# Patient Record
Sex: Female | Born: 1960 | Race: Black or African American | Hispanic: No | Smoking: Former smoker
Health system: Southern US, Community
[De-identification: ages and names within clinical notes are randomized; demographics above are authoritative.]

## PROBLEM LIST (undated history)

## (undated) DIAGNOSIS — E7800 Pure hypercholesterolemia, unspecified: Secondary | ICD-10-CM

## (undated) DIAGNOSIS — B009 Herpesviral infection, unspecified: Secondary | ICD-10-CM

## (undated) DIAGNOSIS — N3281 Overactive bladder: Secondary | ICD-10-CM

## (undated) DIAGNOSIS — G5601 Carpal tunnel syndrome, right upper limb: Secondary | ICD-10-CM

## (undated) DIAGNOSIS — M545 Low back pain, unspecified: Secondary | ICD-10-CM

## (undated) DIAGNOSIS — R87619 Unspecified abnormal cytological findings in specimens from cervix uteri: Secondary | ICD-10-CM

## (undated) DIAGNOSIS — Z9189 Other specified personal risk factors, not elsewhere classified: Secondary | ICD-10-CM

## (undated) DIAGNOSIS — Z8489 Family history of other specified conditions: Secondary | ICD-10-CM

## (undated) DIAGNOSIS — I10 Essential (primary) hypertension: Secondary | ICD-10-CM

## (undated) DIAGNOSIS — L659 Nonscarring hair loss, unspecified: Secondary | ICD-10-CM

## (undated) DIAGNOSIS — J189 Pneumonia, unspecified organism: Secondary | ICD-10-CM

## (undated) DIAGNOSIS — R569 Unspecified convulsions: Secondary | ICD-10-CM

## (undated) HISTORY — PX: CRYOTHERAPY: SHX1416

## (undated) HISTORY — DX: Low back pain, unspecified: M54.50

## (undated) HISTORY — DX: Nonscarring hair loss, unspecified: L65.9

## (undated) HISTORY — PX: TRIGGER FINGER RELEASE: SHX641

## (undated) HISTORY — PX: ECTOPIC PREGNANCY SURGERY: SHX613

## (undated) HISTORY — DX: Carpal tunnel syndrome, right upper limb: G56.01

## (undated) HISTORY — DX: Unspecified convulsions: R56.9

## (undated) HISTORY — DX: Low back pain: M54.5

## (undated) HISTORY — DX: Other specified personal risk factors, not elsewhere classified: Z91.89

## (undated) HISTORY — DX: Overactive bladder: N32.81

## (undated) HISTORY — DX: Unspecified abnormal cytological findings in specimens from cervix uteri: R87.619

## (undated) HISTORY — DX: Herpesviral infection, unspecified: B00.9

## (undated) HISTORY — DX: Pure hypercholesterolemia, unspecified: E78.00

---

## 1999-09-11 ENCOUNTER — Ambulatory Visit (HOSPITAL_COMMUNITY): Admission: RE | Admit: 1999-09-11 | Discharge: 1999-09-11 | Payer: Self-pay | Admitting: Psychiatry

## 2002-01-28 ENCOUNTER — Encounter: Payer: Self-pay | Admitting: Obstetrics and Gynecology

## 2002-01-28 ENCOUNTER — Ambulatory Visit (HOSPITAL_COMMUNITY): Admission: RE | Admit: 2002-01-28 | Discharge: 2002-01-28 | Payer: Self-pay | Admitting: Obstetrics and Gynecology

## 2003-02-03 ENCOUNTER — Ambulatory Visit (HOSPITAL_COMMUNITY): Admission: RE | Admit: 2003-02-03 | Discharge: 2003-02-03 | Payer: Self-pay | Admitting: Obstetrics and Gynecology

## 2003-02-03 ENCOUNTER — Encounter: Payer: Self-pay | Admitting: Obstetrics and Gynecology

## 2004-03-08 ENCOUNTER — Ambulatory Visit (HOSPITAL_COMMUNITY): Admission: RE | Admit: 2004-03-08 | Discharge: 2004-03-08 | Payer: Self-pay | Admitting: Obstetrics and Gynecology

## 2004-03-15 ENCOUNTER — Ambulatory Visit (HOSPITAL_COMMUNITY): Admission: RE | Admit: 2004-03-15 | Discharge: 2004-03-15 | Payer: Self-pay | Admitting: Obstetrics and Gynecology

## 2004-06-22 ENCOUNTER — Inpatient Hospital Stay (HOSPITAL_COMMUNITY): Admission: RE | Admit: 2004-06-22 | Discharge: 2004-06-25 | Payer: Self-pay | Admitting: Obstetrics and Gynecology

## 2004-06-23 HISTORY — PX: ABDOMINAL HYSTERECTOMY: SHX81

## 2004-08-13 ENCOUNTER — Emergency Department (HOSPITAL_COMMUNITY): Admission: EM | Admit: 2004-08-13 | Discharge: 2004-08-13 | Payer: Self-pay | Admitting: Emergency Medicine

## 2005-03-21 ENCOUNTER — Ambulatory Visit (HOSPITAL_COMMUNITY): Admission: RE | Admit: 2005-03-21 | Discharge: 2005-03-21 | Payer: Self-pay | Admitting: Obstetrics and Gynecology

## 2005-04-04 ENCOUNTER — Encounter: Admission: RE | Admit: 2005-04-04 | Discharge: 2005-04-04 | Payer: Self-pay | Admitting: Obstetrics and Gynecology

## 2006-07-10 ENCOUNTER — Ambulatory Visit (HOSPITAL_COMMUNITY): Admission: RE | Admit: 2006-07-10 | Discharge: 2006-07-10 | Payer: Self-pay | Admitting: Obstetrics and Gynecology

## 2007-01-15 ENCOUNTER — Ambulatory Visit (HOSPITAL_COMMUNITY): Admission: RE | Admit: 2007-01-15 | Discharge: 2007-01-15 | Payer: Self-pay | Admitting: Obstetrics and Gynecology

## 2007-07-09 ENCOUNTER — Emergency Department (HOSPITAL_COMMUNITY): Admission: EM | Admit: 2007-07-09 | Discharge: 2007-07-10 | Payer: Self-pay | Admitting: Emergency Medicine

## 2007-07-24 ENCOUNTER — Encounter: Admission: RE | Admit: 2007-07-24 | Discharge: 2007-07-24 | Payer: Self-pay | Admitting: Obstetrics and Gynecology

## 2007-08-06 ENCOUNTER — Ambulatory Visit (HOSPITAL_COMMUNITY): Admission: RE | Admit: 2007-08-06 | Discharge: 2007-08-06 | Payer: Self-pay | Admitting: Surgery

## 2007-10-03 ENCOUNTER — Other Ambulatory Visit: Admission: RE | Admit: 2007-10-03 | Discharge: 2007-10-03 | Payer: Self-pay | Admitting: Obstetrics and Gynecology

## 2008-02-20 ENCOUNTER — Encounter: Admission: RE | Admit: 2008-02-20 | Discharge: 2008-02-20 | Payer: Self-pay | Admitting: Obstetrics and Gynecology

## 2009-02-11 ENCOUNTER — Ambulatory Visit (HOSPITAL_COMMUNITY): Admission: RE | Admit: 2009-02-11 | Discharge: 2009-02-11 | Payer: Self-pay | Admitting: Obstetrics and Gynecology

## 2009-06-02 ENCOUNTER — Other Ambulatory Visit: Admission: RE | Admit: 2009-06-02 | Discharge: 2009-06-02 | Payer: Self-pay | Admitting: Obstetrics and Gynecology

## 2010-02-24 ENCOUNTER — Ambulatory Visit (HOSPITAL_COMMUNITY): Admission: RE | Admit: 2010-02-24 | Discharge: 2010-02-24 | Payer: Self-pay | Admitting: Obstetrics and Gynecology

## 2010-06-21 ENCOUNTER — Other Ambulatory Visit: Admission: RE | Admit: 2010-06-21 | Discharge: 2010-06-21 | Payer: Self-pay | Admitting: Obstetrics and Gynecology

## 2010-10-30 ENCOUNTER — Ambulatory Visit: Payer: Self-pay | Admitting: Women's Health

## 2011-01-21 ENCOUNTER — Encounter: Payer: Self-pay | Admitting: Obstetrics and Gynecology

## 2011-02-01 ENCOUNTER — Other Ambulatory Visit: Payer: Self-pay | Admitting: Obstetrics and Gynecology

## 2011-02-01 DIAGNOSIS — Z1231 Encounter for screening mammogram for malignant neoplasm of breast: Secondary | ICD-10-CM

## 2011-02-01 DIAGNOSIS — Z139 Encounter for screening, unspecified: Secondary | ICD-10-CM

## 2011-02-16 ENCOUNTER — Ambulatory Visit (HOSPITAL_COMMUNITY): Payer: Self-pay

## 2011-02-26 ENCOUNTER — Ambulatory Visit (HOSPITAL_COMMUNITY): Payer: Self-pay

## 2011-03-02 ENCOUNTER — Ambulatory Visit (HOSPITAL_COMMUNITY): Payer: 59

## 2011-03-12 ENCOUNTER — Ambulatory Visit (HOSPITAL_COMMUNITY): Payer: 59

## 2011-04-02 ENCOUNTER — Ambulatory Visit: Payer: 59

## 2011-04-16 ENCOUNTER — Ambulatory Visit
Admission: RE | Admit: 2011-04-16 | Discharge: 2011-04-16 | Disposition: A | Discharge status: Home / Self Care | Payer: 59 | Source: Ambulatory Visit | Attending: Obstetrics and Gynecology | Admitting: Obstetrics and Gynecology

## 2011-04-16 DIAGNOSIS — Z1231 Encounter for screening mammogram for malignant neoplasm of breast: Secondary | ICD-10-CM

## 2011-05-18 NOTE — Op Note (Signed)
NAMEEVANNA, Lisa [name]                       ACCOUNT NO.:  [id number]   MEDICAL RECORD NO.:  [id number]                   PATIENT TYPE:  INP   LOCATION:  A418                                 FACILITY:  [facility]   PHYSICIAN:  Tilda Burrow, M.D.              DATE OF BIRTH:  03-20-1961   DATE OF PROCEDURE:  DATE OF DISCHARGE:                                 OPERATIVE REPORT   ADMITTING DIAGNOSES:  1. Symptomatic uterine fibroids 10-12 weeks.  2. Abdominal wall laxity with redundant skin.   DISCHARGE DIAGNOSES:  1. Symptomatic uterine fibroids 10-12 weeks.  2. Abdominal wall laxity with redundant skin.   PROCEDURE:  1. Abdominal supracervical hysterectomy.  2. Partial panniculectomy.   SURGEON:  Tilda Burrow, M.D.   ASSISTANTAsencion Noble, CST-FA.   ANESTHESIA:  General   COMPLICATIONS:  None.   FINDINGS:  Large fibroids and mobile uterus, lax lower abdominal tissues.   DETAILS OF PROCEDURE:  The patient was taken to the operating room after  preoperative marking of the skin from anterior-superior iliac crest side-to-  side, marking off an ellipse of skin approximately 40 cm in length by 10 cm  in width.  The Foley catheter was in place, the vagina did not require  prepping and we proceeded with hysterectomy.  The wide ellipse of skin was  cut along with its outer edges with Bovie transection and then a flap, 2 cm  thick by 10 cm wide, of skin and underlying fatty tissue was removed.  We  were careful to leave a layer of intact fatty tissue approximately 1-2 cm  thick over the fascia itself.  This specimen was taken off with point  cautery use, as necessary, to achieve hemostasis.  The old C-section scars  were taken out as a part of this.  We then proceeded with a Pfannenstiel-  type fascial incision going transversely and dissecting the fascia off of  the underlying musculature.  This was somewhat challenging due to the  evidence of prior Pfannenstiel incisions x2.   The peritoneal cavity was entered in the midline without any suspicion of  injury to bowel which was inspected.  We then packed the bowel away and used  Balfour retractors to hold sidewalls out of the place.  The uterus could be  rotated into the incision.  We then proceeded with hysterectomy using the  Gyrus bipolar coagulator, cautery device to cross-clamp the round ligament  followed by cross-clamping the uteroovarian vessels.  This was quite  successful.   Upon reaching the level of the uterine vessels on the right we were  concerned and used standard curved Heaney clamp, cross-clamping, and used  the Gyrus for backbleeding and were able to cross-tie the uterine vessels  using #0 chromic suture.  We marched down the side of the broad ligament  reaching the upper cardinal ligaments and stopped.  The bladder flap had  been developed anteriorly  and peeled off without difficulty.   We then used knife dissection to amputate across the lower uterine segment  of the uterus leaving a small 1-cm thick portion of the cervix.  Hemostasis  was not complete due to oozing from the back of the cuff, but this was  treated with a series of figure-of-eight sutures which performed, in  essence, a reverse conization inversion of the cervix.  The pelvis was  irrigated and hemostasis confirmed.   The bladder flap was lightly tacked down over the vaginal cuff using  interrupted 2-0 plain.  We then proceeded with irrigation, removal of  laparotomy equipment, and closure of the anterior peritoneum with 2-0  chromic. At this point we inspected the fascia and removed a 1-cm-to-2-cm  wide x 15 cm long edge of the incision to reduce fascial laxity.  The  remaining fascial tissues were then pulled together with the previously  described #0 Dexon sutures.  Hemostasis was excellent.   Two flat JP drains were placed in the subcu fatty space which was inspected  for hemostasis; point cautery used as necessary. Then  the subcu tissues  reapproximated using a series of 2-0 plain sutures with good tissue edge  reapproximation.  The JP drains were allowed to exit through separate stab  incisions at the sides.   We then proceeded with closure of the skin flaps using staples with 2 drains  placed to suction.  The patient tolerated the procedure well with 200 cc  blood loss.      ___________________________________________                                            Tilda Burrow, M.D.   JVF/MEDQ  D:  06/23/2004  T:  06/23/2004  Job:  [id number]

## 2011-05-18 NOTE — H&P (Signed)
Lisa [name], RAYBORN NO.:  [id number]   MEDICAL RECORD NO.:  [id number]                  PATIENT TYPE:   LOCATION:                                       FACILITY:   PHYSICIAN:  Tilda Burrow, M.D.              DATE OF BIRTH:   DATE OF ADMISSION:  DATE OF DISCHARGE:                                HISTORY & PHYSICAL   ADMISSION DIAGNOSIS:  Symptomatic uterine fibroids, 10 to 12-week size.   HISTORY OF PRESENT ILLNESS:  This 50 year old female, gravida 4, para 3,  with past cesarean section x 1, was admitted at this time for supracervical  hysterectomy.  Panniculectomy is planned as well.  The patient has been seen  through our office, has had symptomatic complaints of uterine fibroids.  She  has been followed in our office for complaints of pressure.  Ultrasound  estimate of uterine weight is 335 g, consistent with 12-week size with  uterine fibroid up to 6 cm in diameter confirmed on ultrasound in March of  this year.  The patient has complaints of pelvic pressure, bladder  restriction, and desire to proceed with hysterectomy.  We have had a lengthy  discussion about the details regarding the hysterectomy.  First of all, the  patient has had a prior cesarean section with scar and has some lower  abdominal wall laxity related to childbearing.  Plans are to proceed with  partial panniculectomy, removing an Elipse of skin and underlying fatty  tissue to reduce the lower abdominal protuberance and the crease that is  associated with the old scar.   Secondly, pros and cons of preserving the cervix have been discussed.  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  There have  been no atypical cells noted of any sort during that decade.  Subsequently,  therefore, the patient  desires preservation of the cervix for pelvic support  and normal cervical lubrication production.   Thirdly, ovarian preservation is planned.   PAST MEDICAL HISTORY:  Positive for seizures.   PAST SURGICAL HISTORY:  1. Laparoscopy involved ectopic pregnancy,and so left salpingectomy was     performed.  2. Cesarean section in 1993.  3. Cryocautery of the cervix in her 4s.   FAMILY HISTORY:  Positive for breast cancer.   SOCIAL HISTORY:  Married, stable relationship.  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   PHYSICAL EXAMINATION:  GENERAL:  Healthy-appearing African-American female,  alert and oriented x 3.  HEENT:  Pupils equal, round, and reactive to light. Extraocular movements  intact.  NECK: Supple.  Trachea midline.  CHEST:  Clear to auscultation.  BREASTS:  Exam normal.  VITAL SIGNS:  Weight 157.  Blood pressure 122/70.  ABDOMEN:  Nontender without masses.  PELVIC:  External genitalia normal.  Multiparous female.  Vagina exam:  Normal secretions. Cervix bulbous, multiparous uterus, anteflexed.  Adnexa  negative for masses other than the 12-week size uterus.   LABORATORY AND X-RAY DATA:  Urinalysis negative.   PLAN:  Supracervical hysterectomy with preservation of ovaries, also with  panniculectomy on June 23, 2004.     ___________________________________________                                         Tilda Burrow, M.D.   JVF/MEDQ  D:  06/21/2004  T:  06/21/2004  Job:  [id number]

## 2011-05-18 NOTE — Discharge Summary (Signed)
[name], [name]                       ACCOUNT NO.:  [id number]   MEDICAL RECORD NO.:  [id number]                   PATIENT TYPE:  INP   LOCATION:  A418                                 FACILITY:  [facility]   PHYSICIAN:  Tilda Burrow, M.D.              DATE OF BIRTH:  30-Oct-1961   DATE OF ADMISSION:  06/22/2004  DATE OF DISCHARGE:  06/25/2004                                 DISCHARGE SUMMARY   ADMITTING DIAGNOSES:  1. Symptomatic uterine fibroids 10-12 weeks size.  2. 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   DISCHARGE DIAGNOSES:  1. Symptomatic uterine fibroids 10-12 weeks size.  2. 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   PROCEDURES:  1. Supracervical hysterectomy.  2. Panniculectomy.   HOSPITAL COURSE:  This healthy 50 year old female with admission for  hysterectomy for uterine fibroids and symptomatic pelvic pressure is  admitted for management after outpatient hysterectomy and panniculectomy.  Findings included very mobile uterus structures, relaxed ovaries and  abdominal wall laxity that could  be improved.  The incision on the  patient's abdomen was approximately 40 cm in length, reaching from hip bone  to hip bone.   Hospital course was straight forward.  The patient had a subcutaneous JP  drain, had hemoglobin 9, hematocrit 34 with excellent postoperative course.  She was considered stable for discharge, as per patient preference, on the  evening of June 25, 2004.  Followup in one week for staple removal and  Jackson-Pratt drain assessment in our office.     ___________________________________________                                         Tilda Burrow, M.D.   JVF/MEDQ  D:  06/29/2004  T:  06/29/2004  Job:  [id number]

## 2011-06-28 ENCOUNTER — Other Ambulatory Visit: Payer: Self-pay | Admitting: Adult Health

## 2011-06-28 ENCOUNTER — Other Ambulatory Visit (HOSPITAL_COMMUNITY)
Admission: RE | Admit: 2011-06-28 | Discharge: 2011-06-28 | Disposition: A | Discharge status: Home / Self Care | Payer: 59 | Source: Ambulatory Visit | Attending: Obstetrics and Gynecology | Admitting: Obstetrics and Gynecology

## 2011-06-28 DIAGNOSIS — Z01419 Encounter for gynecological examination (general) (routine) without abnormal findings: Secondary | ICD-10-CM | POA: Insufficient documentation

## 2011-11-08 LAB — LIPID PANEL
HDL: 56 mg/dL (ref 35–70)
LDL Cholesterol: 147 mg/dL
LDl/HDL Ratio: 4.1
Triglycerides: 119 mg/dL (ref 40–160)

## 2011-11-08 LAB — BASIC METABOLIC PANEL: Glucose: 95 mg/dL

## 2011-11-28 ENCOUNTER — Other Ambulatory Visit: Payer: Self-pay

## 2011-11-28 MED ORDER — FLUCONAZOLE 150 MG PO TABS: 150.0000 mg | ORAL_TABLET | Freq: Once | ORAL | Status: AC

## 2011-11-28 NOTE — Telephone Encounter (Signed)
Office visit if no relief 

## 2011-12-13 ENCOUNTER — Other Ambulatory Visit: Payer: Self-pay | Admitting: *Deleted

## 2012-01-21 ENCOUNTER — Ambulatory Visit (INDEPENDENT_AMBULATORY_CARE_PROVIDER_SITE_OTHER): Payer: BC Managed Care – PPO | Admitting: Family Medicine

## 2012-01-21 ENCOUNTER — Encounter: Payer: Self-pay | Admitting: Family Medicine

## 2012-01-21 VITALS — BP 112/82 | HR 77 | Resp 16 | Ht 62.0 in | Wt 183.0 lb

## 2012-01-21 DIAGNOSIS — G40909 Epilepsy, unspecified, not intractable, without status epilepticus: Secondary | ICD-10-CM | POA: Insufficient documentation

## 2012-01-21 DIAGNOSIS — N393 Stress incontinence (female) (male): Secondary | ICD-10-CM

## 2012-01-21 DIAGNOSIS — R822 Biliuria: Secondary | ICD-10-CM

## 2012-01-21 DIAGNOSIS — R569 Unspecified convulsions: Secondary | ICD-10-CM

## 2012-01-21 DIAGNOSIS — R300 Dysuria: Secondary | ICD-10-CM

## 2012-01-21 DIAGNOSIS — N390 Urinary tract infection, site not specified: Secondary | ICD-10-CM

## 2012-01-21 NOTE — Assessment & Plan Note (Signed)
Referral to urology

## 2012-01-21 NOTE — Patient Instructions (Signed)
Have your labs sent to our office I will set you up with [facility] for the stress incontinence Start a once a day vitamin  Have your neurologist send [place] your next office visit We will call with results of your urine. Follow-up after you see urology

## 2012-01-21 NOTE — Assessment & Plan Note (Signed)
Unknown cause, no hepatic injury or history. Obtain CMET

## 2012-01-21 NOTE — Assessment & Plan Note (Signed)
Followed by neurology.   

## 2012-01-21 NOTE — Assessment & Plan Note (Signed)
Persistent symptoms , culture urine before further treatment

## 2012-01-21 NOTE — Progress Notes (Signed)
  Subjective:    Patient ID: Lisa [name], female    DOB: 06/21/1961, 50 y.o.   MRN: [id number]  HPI Pt here to establish care  OB/GYN- Dr. Emelda Fear, [facility]   Overactive bladder- stress incontinence with sneezing, laughing, 2 vaginal delivers, hysterectomy, does not wear pads, has tried Kegal exercises but this has not helped  Seizure- first occurred  10 years ago during sleep.Dr. Caroline More- [facility], obtains depakote levels once a year , maintained on 200mg  daily   Dyrusia - itching and burning sensation, 2 weeks ago was on antibiotic for urine, continues to have symptoms, denies vaginal discharge  Tooth Infection- was on antibiotics from Oct to Dec- Penicillin and cephalasporin, took Diflucan for Yeast infection a few weeks before UTI diagnosed,s/p bridge. Pt continues to have pain, has f/u with dentist   Works at [employer]- had FLP and a few other labs obtained for wellness exam   Mammogram UTD- First Depree relatives with breast cancer prior to age 51 Review of Systems   GEN- denies fatigue, fever, weight loss,weakness, recent illness HEENT- denies eye drainage, change in vision, nasal discharge, CVS- denies chest pain, palpitations RESP- denies SOB, cough, wheeze ABD- denies N/V, change in stools, abd pain GU- denies dysuria, hematuria, dribbling,+ incontinence MSK- denies joint pain, muscle aches, injury Neuro- denies headache, dizziness, syncope, recent seizure activity      Objective:   Physical Exam GEN- NAD, alert and oriented x3 HEENT- PERRL, EOMI, non injected sclera, pink conjunctiva, MMM, oropharynx clear, bridge noted, no tooth abscess,  Neck- Supple, no thyromegaly, no carotid bruit CVS- RRR, no murmur RESP-CTAB ABD- NABS, soft, NT, ND, no CVA tenderness EXT- No edema Pulses- Radial, DP- 2+        Assessment & Plan:

## 2012-01-23 ENCOUNTER — Telehealth: Payer: Self-pay | Admitting: Family Medicine

## 2012-01-25 ENCOUNTER — Telehealth: Payer: Self-pay | Admitting: Family Medicine

## 2012-01-25 ENCOUNTER — Encounter: Payer: Self-pay | Admitting: Family Medicine

## 2012-01-25 LAB — URINE CULTURE

## 2012-01-25 MED ORDER — AMPICILLIN 500 MG PO CAPS: 500.0000 mg | ORAL_CAPSULE | Freq: Four times a day (QID) | ORAL | Status: AC

## 2012-01-25 NOTE — Telephone Encounter (Signed)
Please let pt know I received her lipid panel , her bad cholesterol- LDL is a little high and her total cholesterol is high, she should incorporate a low fat diet, and decrease fried and fatty foods. Her urine culture showed a more resistant bacteria, I have sent in ampicillin four times a day for 5 days to clear this up.

## 2012-01-28 ENCOUNTER — Telehealth: Payer: Self-pay

## 2012-01-28 MED ORDER — FLUCONAZOLE 150 MG PO TABS: 150.0000 mg | ORAL_TABLET | Freq: Once | ORAL | Status: DC

## 2012-01-28 NOTE — Telephone Encounter (Signed)
Yes, Lisa [name] made aware

## 2012-01-28 NOTE — Telephone Encounter (Signed)
Diflucan provided this patient on antibiotics for urine infection

## 2012-01-28 NOTE — Telephone Encounter (Signed)
Patient made aware by courtney

## 2012-01-31 LAB — CBC
HCT: 38.3 % (ref 36.0–46.0)
Hemoglobin: 13 g/dL (ref 12.0–15.0)
MCHC: 33.9 g/dL (ref 30.0–36.0)
Platelets: 278 10*3/uL (ref 150–400)
RDW: 13.5 % (ref 11.5–15.5)

## 2012-03-11 ENCOUNTER — Ambulatory Visit (INDEPENDENT_AMBULATORY_CARE_PROVIDER_SITE_OTHER): Payer: BC Managed Care – PPO | Admitting: Urology

## 2012-03-11 DIAGNOSIS — R350 Frequency of micturition: Secondary | ICD-10-CM

## 2012-03-11 DIAGNOSIS — R3913 Splitting of urinary stream: Secondary | ICD-10-CM

## 2012-03-11 DIAGNOSIS — R32 Unspecified urinary incontinence: Secondary | ICD-10-CM

## 2012-03-11 DIAGNOSIS — N393 Stress incontinence (female) (male): Secondary | ICD-10-CM

## 2012-03-25 ENCOUNTER — Other Ambulatory Visit: Payer: Self-pay | Admitting: Obstetrics and Gynecology

## 2012-03-25 DIAGNOSIS — Z139 Encounter for screening, unspecified: Secondary | ICD-10-CM

## 2012-04-08 DIAGNOSIS — Z87898 Personal history of other specified conditions: Secondary | ICD-10-CM | POA: Insufficient documentation

## 2012-04-28 ENCOUNTER — Ambulatory Visit (HOSPITAL_COMMUNITY)
Admission: RE | Admit: 2012-04-28 | Discharge: 2012-04-28 | Disposition: A | Discharge status: Home / Self Care | Payer: BC Managed Care – PPO | Source: Ambulatory Visit | Attending: Obstetrics and Gynecology | Admitting: Obstetrics and Gynecology

## 2012-04-28 DIAGNOSIS — Z1231 Encounter for screening mammogram for malignant neoplasm of breast: Secondary | ICD-10-CM | POA: Insufficient documentation

## 2012-04-28 DIAGNOSIS — Z139 Encounter for screening, unspecified: Secondary | ICD-10-CM

## 2012-04-29 ENCOUNTER — Ambulatory Visit (INDEPENDENT_AMBULATORY_CARE_PROVIDER_SITE_OTHER): Payer: BC Managed Care – PPO | Admitting: Urology

## 2012-04-29 DIAGNOSIS — N393 Stress incontinence (female) (male): Secondary | ICD-10-CM

## 2012-04-29 DIAGNOSIS — N3941 Urge incontinence: Secondary | ICD-10-CM

## 2012-04-29 DIAGNOSIS — R3915 Urgency of urination: Secondary | ICD-10-CM

## 2012-05-01 ENCOUNTER — Encounter: Payer: Self-pay | Admitting: Family Medicine

## 2012-05-01 DIAGNOSIS — Z803 Family history of malignant neoplasm of breast: Secondary | ICD-10-CM | POA: Insufficient documentation

## 2012-05-06 ENCOUNTER — Encounter (HOSPITAL_COMMUNITY): Payer: Self-pay | Admitting: *Deleted

## 2012-05-06 ENCOUNTER — Emergency Department (HOSPITAL_COMMUNITY): Payer: BC Managed Care – PPO

## 2012-05-06 ENCOUNTER — Emergency Department (HOSPITAL_COMMUNITY)
Admission: EM | Admit: 2012-05-06 | Discharge: 2012-05-06 | Disposition: A | Discharge status: Home / Self Care | Payer: BC Managed Care – PPO | Attending: Emergency Medicine | Admitting: Emergency Medicine

## 2012-05-06 DIAGNOSIS — M545 Low back pain, unspecified: Secondary | ICD-10-CM | POA: Insufficient documentation

## 2012-05-06 DIAGNOSIS — Z79899 Other long term (current) drug therapy: Secondary | ICD-10-CM | POA: Insufficient documentation

## 2012-05-06 DIAGNOSIS — M5430 Sciatica, unspecified side: Secondary | ICD-10-CM | POA: Insufficient documentation

## 2012-05-06 DIAGNOSIS — M533 Sacrococcygeal disorders, not elsewhere classified: Secondary | ICD-10-CM | POA: Insufficient documentation

## 2012-05-06 DIAGNOSIS — G40909 Epilepsy, unspecified, not intractable, without status epilepticus: Secondary | ICD-10-CM | POA: Insufficient documentation

## 2012-05-06 DIAGNOSIS — IMO0001 Reserved for inherently not codable concepts without codable children: Secondary | ICD-10-CM | POA: Insufficient documentation

## 2012-05-06 DIAGNOSIS — M5432 Sciatica, left side: Secondary | ICD-10-CM

## 2012-05-06 DIAGNOSIS — M79609 Pain in unspecified limb: Secondary | ICD-10-CM | POA: Insufficient documentation

## 2012-05-06 DIAGNOSIS — M25559 Pain in unspecified hip: Secondary | ICD-10-CM | POA: Insufficient documentation

## 2012-05-06 MED ORDER — HYDROCODONE-ACETAMINOPHEN 5-325 MG PO TABS: 1.0000 | ORAL_TABLET | ORAL | Status: AC | PRN

## 2012-05-06 MED ORDER — HYDROCODONE-ACETAMINOPHEN 5-325 MG PO TABS: 2.0000 | ORAL_TABLET | ORAL | Status: AC | PRN

## 2012-05-06 MED ORDER — PREDNISONE 20 MG PO TABS
60.0000 mg | ORAL_TABLET | Freq: Once | ORAL | Status: AC
  Administered 2012-05-06: 60 mg via ORAL
  Filled 2012-05-06: qty 3

## 2012-05-06 MED ORDER — PREDNISONE 20 MG PO TABS: ORAL_TABLET | ORAL | Status: DC

## 2012-05-06 NOTE — ED Notes (Signed)
Pt reports pain to left hip radiating down left leg starting last night, pt denies any injury

## 2012-05-06 NOTE — ED Notes (Signed)
Pt states awoke Sunday night with severe left hip/leg and lower back pain. Denies injury/trauma to back/hip. Pt denies bladder/bowel loss of function. Pt reports pain increases with movement.

## 2012-05-06 NOTE — Discharge Instructions (Signed)
Sciatica Sciatica is a weakness and/or changes in sensation (tingling, jolts, hot and cold, numbness) along the path the sciatic nerve travels. Irritation or damage to lumbar nerve roots is often also referred to as lumbar radiculopathy.  Lumbar radiculopathy (Sciatica) is the most common form of this problem. Radiculopathy can occur in any of the nerves coming out of the spinal cord. The problems caused depend on which nerves are involved. The sciatic nerve is the large nerve supplying the branches of nerves going from the hip to the toes. It often causes a numbness or weakness in the skin and/or muscles that the sciatic nerve serves. It also may cause symptoms (problems) of pain, burning, tingling, or electric shock-like feelings in the path of this nerve. This usually comes from injury to the fibers that make up the sciatic nerve. Some of these symptoms are low back pain and/or unpleasant feelings in the following areas:  From the mid-buttock down the back of the leg to the back of the knee.   And/or the outside of the calf and top of the foot.   And/or behind the inner ankle to the sole of the foot.  CAUSES   Herniated or slipped disc. Discs are the little cushions between the bones in the back.   Pressure by the piriformis muscle in the buttock on the sciatic nerve (Piriformis Syndrome).   Misalignment of the bones in the lower back and buttocks (Sacroiliac Joint Derangement).   Narrowing of the spinal canal that puts pressure on or pinches the fibers that make up the sciatic nerve.   A slipped vertebra that is out of line with those above or beneath it.   Abnormality of the nervous system itself so that nerve fibers do not transmit signals properly, especially to feet and calves (neuropathy).   Tumor (this is rare).  Your caregiver can usually determine the cause of your sciatica and begin the treatment most likely to help you. TREATMENT  Taking over-the-counter painkillers, physical  therapy, rest, exercise, spinal manipulation, and injections of anesthetics and/or steroids may be used. Surgery, acupuncture, and Yoga can also be effective. Mind over matter techniques, mental imagery, and changing factors such as your bed, chair, desk height, posture, and activities are other treatments that may be helpful. You and your caregiver can help determine what is best for you. With proper diagnosis, the cause of most sciatica can be identified and removed. Communication and cooperation between your caregiver and you is essential. If you are not successful immediately, do not be discouraged. With time, a proper treatment can be found that will make you comfortable. HOME CARE INSTRUCTIONS   If the pain is coming from a problem in the back, applying ice to that area for 15 to 20 minutes, 3 to 4 times per day while awake, may be helpful. Put the ice in a plastic bag. Place a towel between the bag of ice and your skin.   You may exercise or perform your usual activities if these do not aggravate your pain, or as suggested by your caregiver.   Only take over-the-counter or prescription medicines for pain, discomfort, or fever as directed by your caregiver.   If your caregiver has given you a follow-up appointment, it is very important to keep that appointment. Not keeping the appointment could result in a chronic or permanent injury, pain, and disability. If there is any problem keeping the appointment, you must call back to this facility for assistance.  SEEK IMMEDIATE MEDICAL CARE   IF:   You experience loss of control of bowel or bladder.   You have increasing weakness in the trunk, buttocks, or legs.   There is numbness in any areas from the hip down to the toes.   You have difficulty walking or keeping your balance.   You have any of the above, with fever or forceful vomiting.  Document Released: 12/11/2001 Document Revised: 12/06/2011 Document Reviewed: 07/30/2008 [facility] Patient  Information 2012 [place], [place].   Take your next dose of prednisone tomorrow evening.   Do not drive within 4 hours of taking hydrocodone  as this will make you drowsy.  Avoid lifting,  Bending,  Twisting or any other activity that worsens your pain over the next week.  Apply an  icepack  to your lower back for 10-15 minutes every 2 hours for the next 2 days.  You should get rechecked if your symptoms are not better over the next 5 days,  Or you develop increased pain,  Weakness in your leg(s) or loss of bladder or bowel function - these are symptoms of a worse injury.

## 2012-05-07 NOTE — ED Provider Notes (Signed)
Medical screening examination/treatment/procedure(s) were performed by non-physician practitioner and as supervising physician I was immediately available for consultation/collaboration.   Carleene Cooper III, MD 05/07/12 [phone]

## 2012-05-07 NOTE — ED Provider Notes (Signed)
History     CSN: [id number]  Arrival date & time 05/06/12  2025   First MD Initiated Contact with Patient 05/06/12 2127      Chief Complaint  Patient presents with  . Hip Pain    (Consider location/radiation/quality/duration/timing/severity/associated sxs/prior treatment) HPI Comments: Patient presents with a 24 hour history of left posterior buttock pain which radiates down her left posterior and lateral leg to her mid calf area.  She denies any new injury,  But does report a history of intermittent low back pain without known source.  Pain is constant with movement and palpation of her left lower back and buttock, and with weight bearing and better when sitting.  She denies weakness and numbness in her legs,  Denies fever, chills,  Urinary or bowel incontinence or retention.  She has not had fevers or chills,  No recent medical procedures and denies drug use.  She works in an Secondary school teacher role,  No work related injuries.  The history is provided by the patient.    Past Medical History  Diagnosis Date  . Seizures   . Carpal tunnel syndrome of right wrist   . Low back pain     Past Surgical History  Procedure Date  . Abdominal hysterectomy   . Ectopic pregnancy surgery   . Cesarean section     Family History  Problem Relation Age of Onset  . Cancer Mother     breast  . Diabetes Mother   . Hypertension Mother   . Cancer Father     lung   . Diabetes Father   . Hypertension Father   . Cancer Sister     breast     History  Substance Use Topics  . Smoking status: Never Smoker   . Smokeless tobacco: Not on file  . Alcohol Use: Yes     Rare    OB History    Grav Para Term Preterm Abortions TAB SAB Ect Mult Living                  Review of Systems  Constitutional: Negative for fever.  Respiratory: Negative for shortness of breath.   Cardiovascular: Negative for chest pain and leg swelling.  Gastrointestinal: Negative for abdominal pain, constipation and  abdominal distention.  Genitourinary: Negative for dysuria, urgency, frequency, flank pain and difficulty urinating.  Musculoskeletal: Positive for back pain. Negative for joint swelling and gait problem.  Skin: Negative for rash.  Neurological: Negative for weakness and numbness.    Allergies  Review of patient's allergies indicates no known allergies.  Home Medications   Current Outpatient Rx  Name Route Sig Dispense Refill  . IBUPROFEN 200 MG PO TABS Oral Take 800-1,000 mg by mouth 3 (three) times daily as needed. For pain    . PHENYTOIN SODIUM EXTENDED 100 MG PO CAPS Oral Take 200 mg by mouth every evening.     Marland Kitchen SOLIFENACIN SUCCINATE 10 MG PO TABS Oral Take 10 mg by mouth every morning.    Marland Kitchen HYDROCODONE-ACETAMINOPHEN 5-325 MG PO TABS Oral Take 1 tablet by mouth every 4 (four) hours as needed for pain. 15 tablet 0  . HYDROCODONE-ACETAMINOPHEN 5-325 MG PO TABS Oral Take 2 tablets by mouth every 4 (four) hours as needed for pain. 6 tablet 0  . PREDNISONE 20 MG PO TABS  6, 5, 4, 3, 2 then 1 tablet by mouth daily for 6 days total. 21 tablet 0    BP 129/74  Pulse 61  Temp(Src)  98.3 F (36.8 C) (Oral)  Resp 18  Ht 5\' 2"  (1.575 m)  Wt 185 lb (83.915 kg)  BMI 33.84 kg/m2  SpO2 100%  Physical Exam  Nursing note and vitals reviewed. Constitutional: She appears well-developed and well-nourished.  HENT:  Head: Normocephalic.  Eyes: Conjunctivae are normal.  Neck: Normal range of motion. Neck supple.  Cardiovascular: Normal rate and intact distal pulses.        Pedal pulses normal.  Pulmonary/Chest: Effort normal.  Abdominal: Soft. Bowel sounds are normal. She exhibits no distension and no mass.  Musculoskeletal: Normal range of motion. She exhibits tenderness. She exhibits no edema.       Lumbar back: She exhibits tenderness. She exhibits no swelling, no edema and no spasm.       Left SI joint ttp.  Neurological: She is alert. She has normal strength. She displays no atrophy and  no tremor. No sensory deficit. Gait normal.  Reflex Scores:      Patellar reflexes are 1+ on the right side and 1+ on the left side.      Achilles reflexes are 1+ on the right side and 1+ on the left side.      No strength deficit noted in hip and knee flexor and extensor muscle groups.  Ankle flexion and extension intact.  dtrs reduced but equal.  Skin: Skin is warm and dry.  Psychiatric: She has a normal mood and affect.    ED Course  Procedures (including critical care time)  Labs Reviewed - No data to display Dg Lumbar Spine Complete  05/06/2012  *RADIOLOGY REPORT*  Clinical Data: Lower back pain, radiating into the left hip.  LUMBAR SPINE - COMPLETE 4+ VIEW  Comparison: None.  Findings: There is no evidence of fracture or subluxation. Vertebral bodies demonstrate normal height and alignment. Intervertebral disc spaces are preserved.  The visualized neural foramina are grossly unremarkable in appearance.  The visualized bowel gas pattern is unremarkable in appearance; air and stool are noted within the colon.  The sacroiliac joints are within normal limits.  IMPRESSION: No evidence of fracture or subluxation along the lumbar spine.  Original Report Authenticated By: Tonia Ghent, M.D.     1. Sciatica of left side       MDM  Patients labs and/or radiological studies were reviewed during the medical decision making and disposition process. No neuro deficit on exam or by history to suggest emergent or surgical presentation.   Pt given prednisone 60 mg po here with prescription for 6 day taper,  Hydrocodone prepack given also with prescription to get filled in am.  Heat therapy,  Avoid lifting, bending,  Get rechecked by pcp if not improved over the next week.  Sx prompting emergent recheck discussed.       Burgess Amor, PA 05/07/12 1409

## 2012-05-13 MED FILL — Hydrocodone-Acetaminophen Tab 5-325 MG: ORAL | Qty: 6 | Status: AC

## 2012-05-19 ENCOUNTER — Ambulatory Visit: Payer: BC Managed Care – PPO | Admitting: Family Medicine

## 2012-05-22 ENCOUNTER — Ambulatory Visit (INDEPENDENT_AMBULATORY_CARE_PROVIDER_SITE_OTHER): Payer: BC Managed Care – PPO | Admitting: Family Medicine

## 2012-05-22 ENCOUNTER — Encounter: Payer: Self-pay | Admitting: Family Medicine

## 2012-05-22 ENCOUNTER — Ambulatory Visit: Payer: BC Managed Care – PPO | Admitting: Family Medicine

## 2012-05-22 VITALS — BP 118/80 | HR 66 | Resp 16 | Ht 62.0 in | Wt 183.4 lb

## 2012-05-22 DIAGNOSIS — E663 Overweight: Secondary | ICD-10-CM

## 2012-05-22 DIAGNOSIS — N393 Stress incontinence (female) (male): Secondary | ICD-10-CM

## 2012-05-22 DIAGNOSIS — Z79899 Other long term (current) drug therapy: Secondary | ICD-10-CM

## 2012-05-22 DIAGNOSIS — E785 Hyperlipidemia, unspecified: Secondary | ICD-10-CM

## 2012-05-22 DIAGNOSIS — R569 Unspecified convulsions: Secondary | ICD-10-CM

## 2012-05-22 DIAGNOSIS — M5430 Sciatica, unspecified side: Secondary | ICD-10-CM

## 2012-05-22 DIAGNOSIS — E669 Obesity, unspecified: Secondary | ICD-10-CM | POA: Insufficient documentation

## 2012-05-22 NOTE — Assessment & Plan Note (Signed)
Check FLP, discussed diet

## 2012-05-22 NOTE — Assessment & Plan Note (Signed)
Doing well, stable on meds, seen by neuro await record

## 2012-05-22 NOTE — Patient Instructions (Signed)
Get the labs done fasting Call if your back pain returns  F/U 1 year or as needed

## 2012-05-22 NOTE — Assessment & Plan Note (Signed)
Doing well since initial episode, responded well to dosepak

## 2012-05-22 NOTE — Assessment & Plan Note (Signed)
Reviewed urology note, doing well on vesicare, she typically takes 1/2 tablet

## 2012-05-22 NOTE — Progress Notes (Signed)
  Subjective:    Patient ID: Lisa [name], female    DOB: [birth year], 51 y.o.   MRN: [id number]  HPI Pt here to f/u ED visit, seen for back pain diagnosed with sciatica has had minimal tingling in back of left calf otherwise feels good. Given steroid dose pak and hydrocodone.  Incontinence- stress- seen by urology on vesicare with good results Due for labs Mammogram normal Seizure- no recent seizure activity, seen by her neurlogist, no change to meds   Review of Systems  GEN- denies fatigue, fever, weight loss,weakness, recent illness HEENT- denies eye drainage, change in vision, nasal discharge, CVS- denies chest pain, palpitations RESP- denies SOB, cough, wheeze ABD- denies N/V, change in stools, abd pain GU- denies dysuria, hematuria, dribbling, incontinence MSK- denies joint pain, muscle aches, injury Neuro- denies headache, dizziness, syncope, seizure activity       Objective:   Physical Exam GEN- NAD, alert and oriented x3 HEENT- PERRL, EOMI, non injected sclera, pink conjunctiva, MMM, oropharynx clear Neck- Supple,  CVS- RRR, no murmur RESP-CTAB BACK- spine non tender, neg SLR bilat Hip- normal ER/IR Neuro- DTR symmetric bilat, no focal deficits EXT- No edema Pulses- Radial, DP- 2+        Assessment & Plan:

## 2012-06-03 LAB — HEPATIC FUNCTION PANEL
AST: 42 U/L — AB (ref 13–35)
Bilirubin, Total: 0.3 mg/dL

## 2012-06-03 LAB — BASIC METABOLIC PANEL
BUN: 15 mg/dL (ref 4–21)
Glucose: 102 mg/dL
Potassium: 4.3 mmol/L (ref 3.4–5.3)
Sodium: 138 mmol/L (ref 137–147)

## 2012-06-03 LAB — LIPID PANEL
HDL: 66 mg/dL (ref 35–70)
Triglycerides: 116 mg/dL (ref 40–160)

## 2012-06-05 ENCOUNTER — Telehealth: Payer: Self-pay | Admitting: Family Medicine

## 2012-06-05 NOTE — Telephone Encounter (Signed)
Called to review labs, left message for pt to return call in AM

## 2012-06-06 ENCOUNTER — Telehealth: Payer: Self-pay | Admitting: Family Medicine

## 2012-06-06 DIAGNOSIS — R7989 Other specified abnormal findings of blood chemistry: Secondary | ICD-10-CM

## 2012-06-06 DIAGNOSIS — R822 Biliuria: Secondary | ICD-10-CM

## 2012-06-06 NOTE — Telephone Encounter (Signed)
I called and left a voice message, LFT (liver function) elevated some, history of bilirubin in urine and her cholesterol is getting worse (labs imput into system) Will need ultrasound of liver to look for any enlargement or signs of why LFT would be elevated.- These are not at dangerous levels  She needs to work on diet- regarding cholesterol avoid fried foods, fatty foods, bread, will not start statin at this time

## 2012-06-09 ENCOUNTER — Encounter: Payer: Self-pay | Admitting: Family Medicine

## 2012-06-09 NOTE — Telephone Encounter (Signed)
Letter mailed

## 2012-06-12 ENCOUNTER — Ambulatory Visit (HOSPITAL_COMMUNITY): Admission: RE | Admit: 2012-06-12 | Payer: BC Managed Care – PPO | Source: Ambulatory Visit

## 2012-06-17 ENCOUNTER — Telehealth: Payer: Self-pay | Admitting: Family Medicine

## 2012-06-17 NOTE — Telephone Encounter (Signed)
I spoke with [name], she has been out of town with her grandmother who has been sick. She needs to have ultrasound rescheduled for in 2 weeks as she will be out of town this Lisa [name]

## 2012-06-23 ENCOUNTER — Telehealth: Payer: Self-pay | Admitting: Family Medicine

## 2012-06-23 NOTE — Telephone Encounter (Signed)
Yes, she needs to followup 

## 2012-06-24 ENCOUNTER — Ambulatory Visit (INDEPENDENT_AMBULATORY_CARE_PROVIDER_SITE_OTHER): Payer: BC Managed Care – PPO | Admitting: Family Medicine

## 2012-06-24 ENCOUNTER — Encounter: Payer: Self-pay | Admitting: Family Medicine

## 2012-06-24 VITALS — BP 124/70 | HR 79 | Resp 16 | Ht 62.0 in | Wt 182.8 lb

## 2012-06-24 DIAGNOSIS — J189 Pneumonia, unspecified organism: Secondary | ICD-10-CM

## 2012-06-24 DIAGNOSIS — R7989 Other specified abnormal findings of blood chemistry: Secondary | ICD-10-CM

## 2012-06-24 NOTE — Patient Instructions (Addendum)
Complete the antibiotics  Get the labs drawn today  Repeat Chest x-ray get this done with your ultrasound- afternoon appt better Note for work  Keep previous f/u appt

## 2012-06-24 NOTE — Progress Notes (Signed)
  Subjective:    Patient ID: Lisa [name], female    DOB: [birth year], 51 y.o.   MRN: [id number]  HPI  Pt here to f/u pneumonia and elevated liver enzymes. She was evaluated by the [facility] in [place] after she began having right upper quadrant pain and feeling short of breath. She was diagnosed with pneumonia and given azithromycin. Labs were obtained which showed even greater elevated liver function testing. They did do a limited ultrasound of the gallbladder which was negative. White blood cell count was elevated to 16,500, AST 101 alkaline phosphatase 251 ALT 97 total bili 1.2. She continues to feel fatigued and has muscle aches her breathing has improved.  Review of Systems   GEN- + fatigue,+ fever, weight loss,weakness,+ recent illness HEENT- denies eye drainage, change in vision, nasal discharge, CVS- denies chest pain, palpitations RESP- denies SOB, +cough, wheeze ABD- denies N/V, change in stools, abd pain GU- denies dysuria, hematuria, dribbling, incontinence MSK- denies joint pain,+ muscle aches, injury Neuro- denies headache, dizziness, syncope, seizure activity      Objective:   Physical Exam GEN- NAD, alert and oriented x3, fatigued appearing HEENT- PERRL, EOMI, non injected sclera, pink conjunctiva, MMM, oropharynx clear Neck- Supple, no LAD CVS- RRR, no murmur RESP- rhonchi in right LL otherwise clear, normal WOB  ABD-NABS, NT,ND, no HSM palpated EXT- No edema Pulses- Radial, DP- 2+        Assessment & Plan:

## 2012-06-25 DIAGNOSIS — J189 Pneumonia, unspecified organism: Secondary | ICD-10-CM | POA: Insufficient documentation

## 2012-06-25 LAB — COMPREHENSIVE METABOLIC PANEL
AST: 35 U/L (ref 0–37)
Albumin: 3.7 g/dL (ref 3.5–5.2)
Alkaline Phosphatase: 296 U/L — ABNORMAL HIGH (ref 39–117)
Chloride: 102 mEq/L (ref 96–112)
Potassium: 4.5 mEq/L (ref 3.5–5.3)
Sodium: 140 mEq/L (ref 135–145)
Total Protein: 6.2 g/dL (ref 6.0–8.3)

## 2012-06-25 NOTE — Assessment & Plan Note (Signed)
Pt tells me today that her husband has Hepatitis C contracted when he was in [place], she has been tested before and was negative in the past , LFT were even higher at [facility] Will send to GI for elevation of abnormal labs Complete Ultrasound pending  Labs for Hep C,B and CMET

## 2012-06-25 NOTE — Assessment & Plan Note (Addendum)
She will complete antibiotics, diagnosed June 24th  Repeat Chest x-ray in 4 weeks Given letter for work

## 2012-06-26 ENCOUNTER — Telehealth: Payer: Self-pay | Admitting: Urgent Care

## 2012-06-26 NOTE — Telephone Encounter (Signed)
We will call Dr Maretta Bees office and have them send [place] referral

## 2012-06-26 NOTE — Telephone Encounter (Signed)
Referral in my work queue. I called patient to set up OV and she said her PCP was suppose to send referral to [facility] office.

## 2012-07-01 ENCOUNTER — Telehealth: Payer: Self-pay | Admitting: Family Medicine

## 2012-07-01 MED ORDER — MOXIFLOXACIN HCL 400 MG PO TABS: 400.0000 mg | ORAL_TABLET | Freq: Every day | ORAL | Status: AC

## 2012-07-01 NOTE — Telephone Encounter (Signed)
Will change to avelox for pneumonia pt not improving, obtain repeat CXR now

## 2012-07-01 NOTE — Telephone Encounter (Signed)
Please have her go ahead and get the chest x-ray ,  I have sent over a stronger antibiotic

## 2012-07-01 NOTE — Telephone Encounter (Signed)
Noted. Called and left voicemail for pt.

## 2012-07-07 ENCOUNTER — Telehealth: Payer: Self-pay | Admitting: Family Medicine

## 2012-07-09 NOTE — Telephone Encounter (Signed)
I sent a second antibiotic on 7/2 called Avelox did she take this?? She can try the chloroseptic, or warm tea with honey, if she cant come in tell her to go to an after hours urgent care close to work to be checked.

## 2012-07-09 NOTE — Telephone Encounter (Signed)
States that she is still having a very sore throat- lastnight it was so bad she had to hold her throat when she swallowed. She is still having thick mucus in her nose and throat that is green in color. Does not want to come back in if she can help it. Has been using OTC products and chloreseptic spray. Any other recommendations?

## 2012-07-10 NOTE — Telephone Encounter (Signed)
Patient aware and will pick up new antibiotic

## 2012-07-16 ENCOUNTER — Encounter (INDEPENDENT_AMBULATORY_CARE_PROVIDER_SITE_OTHER): Payer: Self-pay | Admitting: Internal Medicine

## 2012-07-16 ENCOUNTER — Ambulatory Visit (INDEPENDENT_AMBULATORY_CARE_PROVIDER_SITE_OTHER): Payer: BC Managed Care – PPO | Admitting: Internal Medicine

## 2012-07-16 VITALS — BP 92/60 | HR 64 | Temp 98.5°F | Ht 62.0 in | Wt 182.6 lb

## 2012-07-16 DIAGNOSIS — R748 Abnormal levels of other serum enzymes: Secondary | ICD-10-CM

## 2012-07-16 NOTE — Progress Notes (Signed)
Subjective:     Patient ID: Lisa [name], female   DOB: 1961/01/12, 51 y.o.   MRN: [id number]  Lisa [name] is a 51 yr old female referred to our office by Dr. Jeanice Lim for elevated liver enzymes. She also underwent an [place] which was normal. Hepatitis B and C were negative. No tattoos. Husband has Hepatitis C. Appetite is good.  No weight loss. No abdominal pain.  She was treated in June for pneumonia with Zithromax and Avelox CBC    Component Value Date/Time   WBC 5.0 01/30/2012 1730   RBC 4.47 01/30/2012 1730   HGB 13.0 01/30/2012 1730   HCT 38.3 01/30/2012 1730   PLT 278 01/30/2012 1730   MCV 85.7 01/30/2012 1730   MCH 29.1 01/30/2012 1730   MCHC 33.9 01/30/2012 1730   RDW 13.5 01/30/2012 1730     CMP     Component Value Date/Time   NA 140 06/24/2012 1650   NA 138 06/03/2012   K 4.5 06/24/2012 1650   CL 102 06/24/2012 1650   CO2 25 06/24/2012 1650   GLUCOSE 102* 06/24/2012 1650   BUN 8 06/24/2012 1650   BUN 15 06/03/2012   CREATININE 0.72 06/24/2012 1650   CREATININE 0.9 06/03/2012   CALCIUM 8.6 06/24/2012 1650   PROT 6.2 06/24/2012 1650   ALBUMIN 3.7 06/24/2012 1650   AST 35 06/24/2012 1650   ALT 64* 06/24/2012 1650   ALKPHOS 296* 06/24/2012 1650   BILITOT 0.3 06/24/2012 1650     Review of Systems     Objective:   Physical Exam  Filed Vitals:   07/16/12 1615  Height: 5\' 2"  (1.575 m)  Weight: 182 lb 9.6 oz (82.827 kg)    lert and oriented. Skin warm and dry. Oral mucosa is moist.   . Sclera anicteric, conjunctivae is pink. Thyroid not enlarged. No cervical lymphadenopathy. Lungs clear. Heart regular rate and rhythm.  Abdomen is soft. Bowel sounds are positive. No hepatomegaly. No abdominal masses felt. No tenderness.  No edema to lower extremities. Patient is alert and oriented. Alert and oriented. Skin warm and dry. Oral mucosa is moist.   . Sclera anicteric, conjunctivae is pink. Thyroid not enlarged. No cervical lymphadenopathy. Lungs clear. Heart regular rate and rhythm.  Abdomen is  soft. Bowel sounds are positive. No hepatomegaly. No abdominal masses felt. No tenderness.  No edema to lower extremities. Patient is alert and oriented.      Assessment:    Elevated liver enzymes possible antibiotic induced.  I discussed this case with Dr. Karilyn Cota.  Will also rule out an auto immune process. Plan:    Will repeat Cmet and further recommendations to follow. Also ANA and SMA.

## 2012-07-16 NOTE — Patient Instructions (Signed)
Cmet and further recommendations to follow.

## 2012-07-17 LAB — COMPREHENSIVE METABOLIC PANEL
Alkaline Phosphatase: 119 U/L — ABNORMAL HIGH (ref 39–117)
BUN: 14 mg/dL (ref 6–23)
Creat: 1.24 mg/dL — ABNORMAL HIGH (ref 0.50–1.10)
Glucose, Bld: 83 mg/dL (ref 70–99)
Total Bilirubin: 0.3 mg/dL (ref 0.3–1.2)

## 2012-07-21 ENCOUNTER — Telehealth (INDEPENDENT_AMBULATORY_CARE_PROVIDER_SITE_OTHER): Payer: Self-pay | Admitting: *Deleted

## 2012-07-21 NOTE — Telephone Encounter (Signed)
Per Delrae Rend the patient will need to have LFT in 1 month followed by office visit. Lab is noted for August 22nd

## 2012-07-25 ENCOUNTER — Other Ambulatory Visit: Payer: Self-pay | Admitting: Family Medicine

## 2012-07-30 ENCOUNTER — Encounter (INDEPENDENT_AMBULATORY_CARE_PROVIDER_SITE_OTHER): Payer: Self-pay | Admitting: *Deleted

## 2012-07-30 ENCOUNTER — Other Ambulatory Visit (INDEPENDENT_AMBULATORY_CARE_PROVIDER_SITE_OTHER): Payer: Self-pay | Admitting: *Deleted

## 2012-08-06 ENCOUNTER — Ambulatory Visit (INDEPENDENT_AMBULATORY_CARE_PROVIDER_SITE_OTHER): Payer: BC Managed Care – PPO | Admitting: Internal Medicine

## 2012-08-26 ENCOUNTER — Ambulatory Visit (INDEPENDENT_AMBULATORY_CARE_PROVIDER_SITE_OTHER): Payer: BC Managed Care – PPO | Admitting: Internal Medicine

## 2012-08-26 ENCOUNTER — Other Ambulatory Visit: Payer: Self-pay | Admitting: Adult Health

## 2012-08-26 ENCOUNTER — Other Ambulatory Visit (HOSPITAL_COMMUNITY)
Admission: RE | Admit: 2012-08-26 | Discharge: 2012-08-26 | Disposition: A | Discharge status: Home / Self Care | Payer: BC Managed Care – PPO | Source: Ambulatory Visit | Attending: Obstetrics and Gynecology | Admitting: Obstetrics and Gynecology

## 2012-08-26 ENCOUNTER — Other Ambulatory Visit (INDEPENDENT_AMBULATORY_CARE_PROVIDER_SITE_OTHER): Payer: Self-pay | Admitting: Internal Medicine

## 2012-08-26 ENCOUNTER — Telehealth (INDEPENDENT_AMBULATORY_CARE_PROVIDER_SITE_OTHER): Payer: Self-pay | Admitting: Internal Medicine

## 2012-08-26 DIAGNOSIS — Z01419 Encounter for gynecological examination (general) (routine) without abnormal findings: Secondary | ICD-10-CM | POA: Insufficient documentation

## 2012-08-26 DIAGNOSIS — R748 Abnormal levels of other serum enzymes: Secondary | ICD-10-CM

## 2012-08-26 DIAGNOSIS — Z1151 Encounter for screening for human papillomavirus (HPV): Secondary | ICD-10-CM | POA: Insufficient documentation

## 2012-08-26 LAB — HEPATIC FUNCTION PANEL
ALT: 24 U/L (ref 0–35)
AST: 19 U/L (ref 0–37)
Alkaline Phosphatase: 106 U/L (ref 39–117)
Bilirubin, Direct: 0.1 mg/dL (ref 0.0–0.3)
Indirect Bilirubin: 0.2 mg/dL (ref 0.0–0.9)

## 2012-08-26 NOTE — Progress Notes (Signed)
To be drawn today

## 2012-08-26 NOTE — Progress Notes (Signed)
Patient is to have drawn today

## 2012-08-26 NOTE — Telephone Encounter (Signed)
Will try again for the ANA and SMA.

## 2012-08-26 NOTE — Telephone Encounter (Signed)
Will get labs today 

## 2012-08-26 NOTE — Progress Notes (Signed)
This lab was not drawn. Will repeat. I have spoke with patient today

## 2012-08-27 ENCOUNTER — Encounter (INDEPENDENT_AMBULATORY_CARE_PROVIDER_SITE_OTHER): Payer: Self-pay | Admitting: Internal Medicine

## 2012-08-27 ENCOUNTER — Telehealth (INDEPENDENT_AMBULATORY_CARE_PROVIDER_SITE_OTHER): Payer: Self-pay | Admitting: *Deleted

## 2012-08-27 ENCOUNTER — Ambulatory Visit (INDEPENDENT_AMBULATORY_CARE_PROVIDER_SITE_OTHER): Payer: BC Managed Care – PPO | Admitting: Internal Medicine

## 2012-08-27 VITALS — BP 92/70 | HR 72 | Temp 98.4°F | Ht 62.0 in | Wt 181.4 lb

## 2012-08-27 DIAGNOSIS — R748 Abnormal levels of other serum enzymes: Secondary | ICD-10-CM

## 2012-08-27 LAB — ANTI-NUCLEAR AB-TITER (ANA TITER): ANA Titer 1: 1:80 {titer} — ABNORMAL HIGH

## 2012-08-27 NOTE — Progress Notes (Signed)
Subjective:     Patient ID: Lisa [name], female   DOB: 24-Oct-1961, 51 y.o.   MRN: [id number]  HPI Lisa [name] is a 51 yr old female here for f/u of her elevated transaminases. There has been no jaundice Hepatitis B and C markers were negative. No tattos. Husband has Hepatitis C.  She was treated in June for pneumonia with Zithromax and Avelox.  [place] normal.  ANA titer was negative. She feels good.  Appetite is good. No weight loss. BMs are normal.  She denies any problems.  CMP     Component Value Date/Time   NA 139 07/16/2012 1633   NA 138 06/03/2012   K 4.3 07/16/2012 1633   CL 101 07/16/2012 1633   CO2 28 07/16/2012 1633   GLUCOSE 83 07/16/2012 1633   BUN 14 07/16/2012 1633   BUN 15 06/03/2012   CREATININE 1.24* 07/16/2012 1633   CREATININE 0.9 06/03/2012   CALCIUM 9.5 07/16/2012 1633   PROT 6.7 08/26/2012 1032   ALBUMIN 4.3 08/26/2012 1032   AST 19 08/26/2012 1032   ALT 24 08/26/2012 1032   ALKPHOS 106 08/26/2012 1032   BILITOT 0.3 08/26/2012 1032  06/24/2012 ALP 296, ALT 64, AST 35,  06/03/2012 ALP 180, AST 42, ALT 38  . Review of Systems see hpi      Objective:   Physical Exam  Filed Vitals:   08/27/12 1606  Height: 5\' 2"  (1.575 m)  Weight: 181 lb 6.4 oz (82.283 kg)   Alert and oriented. Skin warm and dry. Oral mucosa is moist.   . Sclera anicteric, conjunctivae is pink. Thyroid not enlarged. No cervical lymphadenopathy. Lungs clear. Heart regular rate and rhythm.  Abdomen is soft. Bowel sounds are positive. No hepatomegaly. No abdominal masses felt. No tenderness.  No edema to lower extremities.       Assessment:   Elevated liver enzymes which are normal now. Probably antibiotic induced.  I discussed this case with Dr. Karilyn Cota earlier.   Plan:   Will repeat a hepatic function in 6 months, if normal no further work up.

## 2012-08-27 NOTE — Telephone Encounter (Signed)
Per Delrae Rend the patient will need to have C-Met in 6 months

## 2012-08-27 NOTE — Patient Instructions (Addendum)
Liver profile in 6 months.

## 2012-09-15 ENCOUNTER — Encounter: Payer: Self-pay | Admitting: Family Medicine

## 2012-09-15 ENCOUNTER — Ambulatory Visit (INDEPENDENT_AMBULATORY_CARE_PROVIDER_SITE_OTHER): Payer: BC Managed Care – PPO | Admitting: Family Medicine

## 2012-09-15 VITALS — BP 118/74 | HR 67 | Resp 16 | Ht 62.0 in | Wt 183.0 lb

## 2012-09-15 DIAGNOSIS — E663 Overweight: Secondary | ICD-10-CM

## 2012-09-15 DIAGNOSIS — R569 Unspecified convulsions: Secondary | ICD-10-CM

## 2012-09-15 DIAGNOSIS — E785 Hyperlipidemia, unspecified: Secondary | ICD-10-CM

## 2012-09-15 DIAGNOSIS — R7989 Other specified abnormal findings of blood chemistry: Secondary | ICD-10-CM

## 2012-09-15 NOTE — Assessment & Plan Note (Signed)
She was found to have positive ANA with a 1-80 titer it appears this was insignificant however this needs to be clarified.

## 2012-09-15 NOTE — Assessment & Plan Note (Signed)
Well controlled on dilantin, no recent activity

## 2012-09-15 NOTE — Patient Instructions (Signed)
Continue current medications  I will Terri about the liver labs  F/U 1 year

## 2012-09-15 NOTE — Assessment & Plan Note (Signed)
Recheck fasting labs with work in Oct, then discuss any need for medications. Discussed diet

## 2012-09-15 NOTE — Progress Notes (Signed)
  Subjective:    Patient ID: Lisa [name], female    DOB: [birth year], 51 y.o.   MRN: [id number]  HPI  Pt here to f/u chronic medical problems, doing well no concerns Due for fasting labs- will have drawn at work on Oct 7th Flu shot done at work  Mammogram/PAP Smear UTD  Medications reviewed She's not had any recent seizure activity. She was seen by gastroenterology secondary to elevated LFTs.    Review of Systems  GEN- denies fatigue, fever, weight loss,weakness, recent illness HEENT- denies eye drainage, change in vision, nasal discharge, CVS- denies chest pain, palpitations RESP- denies SOB, cough, wheeze ABD- denies N/V, change in stools, abd pain GU- denies dysuria, hematuria, dribbling, incontinence MSK- denies joint pain, muscle aches, injury Neuro- denies headache, dizziness, syncope, seizure activity      Objective:   Physical Exam GEN- NAD, alert and oriented x3 HEENT- PERRL, EOMI, non injected sclera, pink conjunctiva, MMM, oropharynx clear Neck- Supple, no thryomegaly CVS- RRR, no murmur RESP-CTAB ABD-NABS,soft,NT,ND EXT- No edema, callus on feet Pulses- Radial, DP- 2+        Assessment & Plan:

## 2012-09-15 NOTE — Assessment & Plan Note (Signed)
Needs to work on weight loss and activity

## 2012-09-17 ENCOUNTER — Telehealth (INDEPENDENT_AMBULATORY_CARE_PROVIDER_SITE_OTHER): Payer: Self-pay | Admitting: Internal Medicine

## 2012-09-17 ENCOUNTER — Telehealth (INDEPENDENT_AMBULATORY_CARE_PROVIDER_SITE_OTHER): Payer: Self-pay | Admitting: *Deleted

## 2012-09-17 DIAGNOSIS — R748 Abnormal levels of other serum enzymes: Secondary | ICD-10-CM

## 2012-09-17 DIAGNOSIS — E785 Hyperlipidemia, unspecified: Secondary | ICD-10-CM

## 2012-09-17 NOTE — Telephone Encounter (Signed)
Labs have been noted for around 12/17/12. Patient will need to have office visit in 3 months.[name])

## 2012-09-17 NOTE — Telephone Encounter (Signed)
Per Delrae Rend the patient will need to have lab work in 3 months prior to a office visit in 3 months.

## 2012-09-17 NOTE — Telephone Encounter (Signed)
Lisa [name]: She needs a Hepatic function, ANA, SMA, and sed rate in 3 months.     [name], OV in 3 months.    Dr. Jeanice Lim, we are repeating Hepatic function, ANA, SMA, and sed rate on her in 3 months. OV in 3 months. I discussed with DR. Rehman.

## 2012-09-18 NOTE — Telephone Encounter (Signed)
Apt has been scheduled for 12/18/12 with Dorene Ar, NP

## 2012-11-25 ENCOUNTER — Telehealth (INDEPENDENT_AMBULATORY_CARE_PROVIDER_SITE_OTHER): Payer: Self-pay | Admitting: *Deleted

## 2012-11-25 ENCOUNTER — Encounter (INDEPENDENT_AMBULATORY_CARE_PROVIDER_SITE_OTHER): Payer: Self-pay | Admitting: *Deleted

## 2012-11-25 DIAGNOSIS — R748 Abnormal levels of other serum enzymes: Secondary | ICD-10-CM

## 2012-11-25 DIAGNOSIS — E785 Hyperlipidemia, unspecified: Secondary | ICD-10-CM

## 2012-11-25 NOTE — Telephone Encounter (Signed)
Lab order printed

## 2012-12-17 ENCOUNTER — Other Ambulatory Visit (INDEPENDENT_AMBULATORY_CARE_PROVIDER_SITE_OTHER): Payer: Self-pay | Admitting: Internal Medicine

## 2012-12-18 ENCOUNTER — Encounter (INDEPENDENT_AMBULATORY_CARE_PROVIDER_SITE_OTHER): Payer: Self-pay | Admitting: Internal Medicine

## 2012-12-18 ENCOUNTER — Ambulatory Visit (INDEPENDENT_AMBULATORY_CARE_PROVIDER_SITE_OTHER): Payer: BC Managed Care – PPO | Admitting: Internal Medicine

## 2012-12-18 VITALS — BP 114/70 | HR 60 | Temp 97.3°F | Ht 62.0 in | Wt 188.2 lb

## 2012-12-18 DIAGNOSIS — R748 Abnormal levels of other serum enzymes: Secondary | ICD-10-CM

## 2012-12-18 LAB — ANA: Anti Nuclear Antibody(ANA): POSITIVE — AB

## 2012-12-18 LAB — HEPATIC FUNCTION PANEL
Albumin: 4 g/dL (ref 3.5–5.2)
Alkaline Phosphatase: 149 U/L — ABNORMAL HIGH (ref 39–117)
Bilirubin, Direct: 0.1 mg/dL (ref 0.0–0.3)
Indirect Bilirubin: 0.3 mg/dL (ref 0.0–0.9)
Total Bilirubin: 0.4 mg/dL (ref 0.3–1.2)

## 2012-12-18 LAB — ANTI-NUCLEAR AB-TITER (ANA TITER)

## 2012-12-18 NOTE — Patient Instructions (Addendum)
Hepatic profile in 2 months

## 2012-12-18 NOTE — Progress Notes (Signed)
Subjective:     Patient ID: Lisa [name], female   DOB: 07/12/1961, 51 y.o.   MRN: [id number] HPIHere for f/u. Referred to our office in July for elevated liver enzymes. Noted 06/24/2012 ALP 296 and ALT 64 [place] was normal. Hepatitis B and C markers were negative. No tattoos. Husband has Hepatitis C. Treated in June for pneumonia with Zithromax and Avelox. Appetite has remained good. No weight loss. No melena or bright red rectal bleeding. She has been taking Motrin 800mg  BID or TID for a toothache Sedrate 13 normal, SMA negative, ANA positive.  12/16/2012 ANA positive. Titer 1.40 which is barely elevated. Hepatic Function Panel     Component Value Date/Time   PROT 6.6 12/17/2012 1802   ALBUMIN 4.0 12/17/2012 1802   AST 37 12/17/2012 1802   ALT 42* 12/17/2012 1802   ALKPHOS 149* 12/17/2012 1802   BILITOT 0.4 12/17/2012 1802   BILIDIR 0.1 12/17/2012 1802   IBILI 0.3 12/17/2012 1802      CBC    Component Value Date/Time   WBC 5.0 01/30/2012 1730   RBC 4.47 01/30/2012 1730   HGB 13.0 01/30/2012 1730   HCT 38.3 01/30/2012 1730   PLT 278 01/30/2012 1730   MCV 85.7 01/30/2012 1730   MCH 29.1 01/30/2012 1730   MCHC 33.9 01/30/2012 1730   RDW 13.5 01/30/2012 1730   .   Review of Systems see hpi Current Outpatient Prescriptions  Medication Sig Dispense Refill  . loratadine (CLARITIN) 10 MG tablet Take 10 mg by mouth daily.      . phenytoin (DILANTIN) 100 MG ER capsule Take 200 mg by mouth every evening.       . solifenacin (VESICARE) 10 MG tablet Take 10 mg by mouth every morning.       Past Medical History  Diagnosis Date  . Seizures   . Carpal tunnel syndrome of right wrist   . Low back pain   No Known Allergies      Objective:   Physical Exam Filed Vitals:   12/18/12 1605  BP: 114/70  Pulse: 60  Temp: 97.3 F (36.3 C)  Height: 5\' 2"  (1.575 m)  Weight: 188 lb 3.2 oz (85.367 kg)    Alert and oriented. Skin warm and dry. Oral mucosa is moist.   . Sclera anicteric,  conjunctivae is pink. Thyroid not enlarged. No cervical lymphadenopathy. Lungs clear. Heart regular rate and rhythm.  Abdomen is soft. Bowel sounds are positive. No hepatomegaly. No abdominal masses felt. No tenderness.  No edema to lower extremities.      Assessment:   Elevated liver enzymes. US abdomen normal. Will repeat in 2 month Hepatic profile    Plan:     Hepatic profile in 2 months.

## 2012-12-22 ENCOUNTER — Telehealth (INDEPENDENT_AMBULATORY_CARE_PROVIDER_SITE_OTHER): Payer: Self-pay | Admitting: *Deleted

## 2012-12-22 NOTE — Telephone Encounter (Signed)
Per Terri the patient will need to have labs in 2 months

## 2013-01-29 ENCOUNTER — Telehealth (INDEPENDENT_AMBULATORY_CARE_PROVIDER_SITE_OTHER): Payer: Self-pay | Admitting: *Deleted

## 2013-01-29 ENCOUNTER — Encounter (INDEPENDENT_AMBULATORY_CARE_PROVIDER_SITE_OTHER): Payer: Self-pay | Admitting: *Deleted

## 2013-01-29 NOTE — Telephone Encounter (Signed)
Lab order printed

## 2013-02-20 LAB — COMPREHENSIVE METABOLIC PANEL
ALT: 23 U/L (ref 0–35)
Alkaline Phosphatase: 94 U/L (ref 39–117)
CO2: 29 mEq/L (ref 19–32)
Sodium: 136 mEq/L (ref 135–145)
Total Bilirubin: 0.3 mg/dL (ref 0.3–1.2)
Total Protein: 6.6 g/dL (ref 6.0–8.3)

## 2013-02-20 LAB — HEPATIC FUNCTION PANEL
ALT: 24 U/L (ref 0–35)
AST: 23 U/L (ref 0–37)
Albumin: 4.2 g/dL (ref 3.5–5.2)
Total Bilirubin: 0.3 mg/dL (ref 0.3–1.2)

## 2013-02-23 ENCOUNTER — Telehealth (INDEPENDENT_AMBULATORY_CARE_PROVIDER_SITE_OTHER): Payer: Self-pay | Admitting: *Deleted

## 2013-02-23 DIAGNOSIS — R748 Abnormal levels of other serum enzymes: Secondary | ICD-10-CM

## 2013-02-23 NOTE — Telephone Encounter (Signed)
Lab is noted. 

## 2013-02-23 NOTE — Telephone Encounter (Signed)
Per Dorene Ar

## 2013-03-03 ENCOUNTER — Telehealth (INDEPENDENT_AMBULATORY_CARE_PROVIDER_SITE_OTHER): Payer: Self-pay | Admitting: *Deleted

## 2013-03-03 NOTE — Telephone Encounter (Signed)
Addressed. Repeat in 3 months. Message had been left at her house on 02/23/2013

## 2013-03-03 NOTE — Telephone Encounter (Signed)
Lisa [name] would like to get her lab results done in Feb. The return number before 5:00 pm is [phone] and after 5:00 pm will be [phone].

## 2013-03-23 LAB — LIPID PANEL
Cholesterol: 263 mg/dL — AB (ref 0–200)
HDL: 57 mg/dL (ref 35–70)
LDl/HDL Ratio: 4.6
Triglycerides: 85 mg/dL (ref 40–160)

## 2013-03-23 LAB — BASIC METABOLIC PANEL
BUN: 13 mg/dL (ref 4–21)
Creatinine: 1 mg/dL (ref 0.5–1.1)

## 2013-03-23 LAB — HEPATIC FUNCTION PANEL: AST: 13 U/L (ref 13–35)

## 2013-03-25 ENCOUNTER — Other Ambulatory Visit: Payer: Self-pay | Admitting: Family Medicine

## 2013-03-25 DIAGNOSIS — Z139 Encounter for screening, unspecified: Secondary | ICD-10-CM

## 2013-04-16 ENCOUNTER — Other Ambulatory Visit: Payer: Self-pay | Admitting: Family Medicine

## 2013-05-04 ENCOUNTER — Inpatient Hospital Stay (HOSPITAL_COMMUNITY): Admission: RE | Admit: 2013-05-04 | Payer: BC Managed Care – PPO | Source: Ambulatory Visit

## 2013-05-08 ENCOUNTER — Ambulatory Visit (INDEPENDENT_AMBULATORY_CARE_PROVIDER_SITE_OTHER): Payer: BC Managed Care – PPO | Admitting: Family Medicine

## 2013-05-08 ENCOUNTER — Encounter: Payer: Self-pay | Admitting: Family Medicine

## 2013-05-08 VITALS — BP 110/68 | HR 66 | Resp 18 | Ht 62.0 in | Wt 176.0 lb

## 2013-05-08 DIAGNOSIS — E669 Obesity, unspecified: Secondary | ICD-10-CM

## 2013-05-08 DIAGNOSIS — R569 Unspecified convulsions: Secondary | ICD-10-CM

## 2013-05-08 DIAGNOSIS — E538 Deficiency of other specified B group vitamins: Secondary | ICD-10-CM

## 2013-05-08 DIAGNOSIS — E785 Hyperlipidemia, unspecified: Secondary | ICD-10-CM

## 2013-05-08 MED ORDER — ATORVASTATIN CALCIUM 10 MG PO TABS: 10.0000 mg | ORAL_TABLET | Freq: Every day | ORAL | Status: DC

## 2013-05-08 NOTE — Patient Instructions (Addendum)
Continue current medications Start Lipitor for cholesterol at bedtime Call Monday for your mammogram Diflucan refilled F/U 3 months [employer]

## 2013-05-10 DIAGNOSIS — E538 Deficiency of other specified B group vitamins: Secondary | ICD-10-CM | POA: Insufficient documentation

## 2013-05-10 MED ORDER — FLUCONAZOLE 150 MG PO TABS: ORAL_TABLET | ORAL | Status: DC

## 2013-05-10 NOTE — Progress Notes (Signed)
  Subjective:    Patient ID: Lisa [name], female    DOB: [birth year], 52 y.o.   MRN: [id number]  HPI  Pt here to f/u chronic medical problems. Had labs done at work, no specific concerns Fasting labs reviewed Medications reviewed  No recent seizure  Doing well with vesicare, asked for refill on diflucan to have at home Review of Systems  GEN- denies fatigue, fever, weight loss,weakness, recent illness HEENT- denies eye drainage, change in vision, nasal discharge, CVS- denies chest pain, palpitations RESP- denies SOB, cough, wheeze ABD- denies N/V, change in stools, abd pain GU- denies dysuria, hematuria, dribbling, incontinence MSK- denies joint pain, muscle aches, injury Neuro- denies headache, dizziness, syncope, seizure activity      Objective:   Physical Exam GEN- NAD, alert and oriented x3 HEENT- PERRL, EOMI, non injected sclera, pink conjunctiva, MMM, oropharynx clear Neck- Supple,  CVS- RRR, no murmur RESP-CTAB EXT- No edema Pulses- Radial, DP- 2+        Assessment & Plan:

## 2013-05-10 NOTE — Assessment & Plan Note (Signed)
Start lipitor 10mg , her LDL continues to increase now at 189 Her work nurse will draw another lab in 3 months With her weight loss and dietary changes, she may be able to come off statin therapy if we can get a vast improvement

## 2013-05-10 NOTE — Assessment & Plan Note (Signed)
She has no known anemia, her B12 level in March 2014 was 205 write at normal cut off, no parethesias, advised her to take OTC B12 supplement

## 2013-05-10 NOTE — Assessment & Plan Note (Signed)
Seizure free 

## 2013-05-10 NOTE — Assessment & Plan Note (Signed)
Noted weight loss, to continue to work on weight, recent dietary changes

## 2013-05-18 ENCOUNTER — Ambulatory Visit (HOSPITAL_COMMUNITY): Payer: BC Managed Care – PPO

## 2013-05-18 ENCOUNTER — Ambulatory Visit (HOSPITAL_COMMUNITY)
Admission: RE | Admit: 2013-05-18 | Discharge: 2013-05-18 | Disposition: A | Discharge status: Home / Self Care | Payer: BC Managed Care – PPO | Source: Ambulatory Visit | Attending: Family Medicine | Admitting: Family Medicine

## 2013-05-18 DIAGNOSIS — Z1231 Encounter for screening mammogram for malignant neoplasm of breast: Secondary | ICD-10-CM | POA: Insufficient documentation

## 2013-05-18 DIAGNOSIS — Z139 Encounter for screening, unspecified: Secondary | ICD-10-CM

## 2013-05-20 ENCOUNTER — Encounter: Payer: Self-pay | Admitting: Family Medicine

## 2013-06-02 ENCOUNTER — Telehealth: Payer: Self-pay | Admitting: Family Medicine

## 2013-06-02 NOTE — Telephone Encounter (Signed)
Since she started lipitor at our appt in May She needs to have labs repeated in August- This can be done by her work Engineer, civil (consulting).   She was given order form at last visit, will need Lipid profile and CMET (Liver function needs to be checked on new drug)

## 2013-06-04 NOTE — Telephone Encounter (Signed)
Pt aware.

## 2013-08-19 ENCOUNTER — Telehealth: Payer: Self-pay | Admitting: Family Medicine

## 2013-08-24 NOTE — Telephone Encounter (Signed)
Faxed on 08/21/13 and patient aware

## 2013-09-08 ENCOUNTER — Ambulatory Visit (INDEPENDENT_AMBULATORY_CARE_PROVIDER_SITE_OTHER): Payer: BC Managed Care – PPO | Admitting: Urology

## 2013-09-08 DIAGNOSIS — R3915 Urgency of urination: Secondary | ICD-10-CM

## 2013-09-08 DIAGNOSIS — N393 Stress incontinence (female) (male): Secondary | ICD-10-CM

## 2013-09-14 ENCOUNTER — Ambulatory Visit (INDEPENDENT_AMBULATORY_CARE_PROVIDER_SITE_OTHER): Payer: BC Managed Care – PPO | Admitting: Adult Health

## 2013-09-14 ENCOUNTER — Encounter: Payer: Self-pay | Admitting: Adult Health

## 2013-09-14 VITALS — BP 122/72 | HR 72 | Ht 61.5 in | Wt 175.0 lb

## 2013-09-14 DIAGNOSIS — Z139 Encounter for screening, unspecified: Secondary | ICD-10-CM

## 2013-09-14 DIAGNOSIS — Z01419 Encounter for gynecological examination (general) (routine) without abnormal findings: Secondary | ICD-10-CM

## 2013-09-14 DIAGNOSIS — Z1212 Encounter for screening for malignant neoplasm of rectum: Secondary | ICD-10-CM

## 2013-09-14 LAB — HEMOCCULT GUIAC POC 1CARD (OFFICE)

## 2013-09-14 MED ORDER — FLUCONAZOLE 150 MG PO TABS: ORAL_TABLET | ORAL | Status: DC

## 2013-09-14 NOTE — Progress Notes (Signed)
Patient ID: Lisa [name], female   DOB: 1961/12/03, 52 y.o.   MRN: [id number] History of Present Illness: Lisa [name] is a 52 year old black female married in for physical.She had a normal pap with negative HPV 07/2013.   Current Medications, Allergies, Past Medical History, Past Surgical History, Family History and Social History were reviewed in [employer] record.     Review of Systems: Patient denies any headaches, blurred vision, shortness of breath, chest pain, abdominal pain, problems with bowel movements, urination, or intercourse.no joint pain or mood swings she is getting ready to be grandma soon.     Physical Exam:BP 122/72  Pulse 72  Ht 5' 1.5" (1.562 m)  Wt 175 lb (79.379 kg)  BMI 32.53 kg/m2 General:  Well developed, well nourished, no acute distress Skin:  Warm and dry Neck:  Midline trachea, normal thyroid Lungs; Clear to auscultation bilaterally Breast:  No dominant palpable mass, retraction, or nipple discharge Cardiovascular: Regular rate and rhythm Abdomen:  Soft, non tender, no hepatosplenomegaly Pelvic:  External genitalia is normal in appearance.  The vagina is normal in appearance.  The cervix is bulbous.  Uterus is absent.  No adnexal masses or tenderness noted. Rectal: Good sphincter tone, no polyps, or hemorrhoids felt.  Hemoccult negative. Extremities:  No swelling or varicosities noted Psych: alert and cooperative seems happy   Impression: Yearly gyn exam-no pap Needs screening colonoscopy    Plan: Physical in 1 year Refer to Dr Karilyn Cota for colonoscopy Mammogram yearly Get flu shot at work Labs with PCP

## 2013-09-14 NOTE — Patient Instructions (Addendum)
Get colonoscopy Physical in 1 year Mammogram yearly Labs with PCP Get flu shot

## 2013-09-15 ENCOUNTER — Encounter (INDEPENDENT_AMBULATORY_CARE_PROVIDER_SITE_OTHER): Payer: Self-pay | Admitting: *Deleted

## 2013-09-22 ENCOUNTER — Other Ambulatory Visit: Payer: Self-pay | Admitting: Family Medicine

## 2013-09-23 NOTE — Telephone Encounter (Signed)
Meds refilled.

## 2013-10-27 LAB — LIPID PANEL
Cholesterol: 204 mg/dL — AB (ref 0–200)
HDL: 70 mg/dL (ref 35–70)
LDL Cholesterol: 114 mg/dL
TRIGLYCERIDES: 100 mg/dL (ref 40–160)

## 2013-12-28 ENCOUNTER — Other Ambulatory Visit: Payer: Self-pay | Admitting: Family Medicine

## 2013-12-29 ENCOUNTER — Encounter: Payer: Self-pay | Admitting: Family Medicine

## 2013-12-29 NOTE — Telephone Encounter (Signed)
Medication refill for one time only.  Patient needs to be seen.  Letter sent for patient to call and schedule 

## 2014-01-31 ENCOUNTER — Other Ambulatory Visit: Payer: Self-pay | Admitting: Family Medicine

## 2014-02-02 ENCOUNTER — Encounter: Payer: Self-pay | Admitting: Family Medicine

## 2014-02-03 NOTE — Telephone Encounter (Signed)
One mth refill until OV

## 2014-02-10 ENCOUNTER — Encounter: Payer: Self-pay | Admitting: Family Medicine

## 2014-02-10 ENCOUNTER — Telehealth: Payer: Self-pay | Admitting: Family Medicine

## 2014-02-10 ENCOUNTER — Ambulatory Visit (INDEPENDENT_AMBULATORY_CARE_PROVIDER_SITE_OTHER): Payer: BC Managed Care – PPO | Admitting: Family Medicine

## 2014-02-10 VITALS — BP 110/70 | HR 78 | Temp 98.2°F | Resp 18 | Ht 60.0 in | Wt 178.0 lb

## 2014-02-10 DIAGNOSIS — E538 Deficiency of other specified B group vitamins: Secondary | ICD-10-CM | POA: Diagnosis not present

## 2014-02-10 DIAGNOSIS — E669 Obesity, unspecified: Secondary | ICD-10-CM | POA: Diagnosis not present

## 2014-02-10 DIAGNOSIS — Z130 Encounter for screening for diseases of the blood and blood-forming organs and certain disorders involving the immune mechanism: Secondary | ICD-10-CM

## 2014-02-10 DIAGNOSIS — R569 Unspecified convulsions: Secondary | ICD-10-CM

## 2014-02-10 DIAGNOSIS — Z1329 Encounter for screening for other suspected endocrine disorder: Secondary | ICD-10-CM

## 2014-02-10 DIAGNOSIS — E785 Hyperlipidemia, unspecified: Secondary | ICD-10-CM

## 2014-02-10 DIAGNOSIS — Z20828 Contact with and (suspected) exposure to other viral communicable diseases: Secondary | ICD-10-CM | POA: Diagnosis not present

## 2014-02-10 DIAGNOSIS — Z1321 Encounter for screening for nutritional disorder: Secondary | ICD-10-CM

## 2014-02-10 DIAGNOSIS — Z205 Contact with and (suspected) exposure to viral hepatitis: Secondary | ICD-10-CM

## 2014-02-10 DIAGNOSIS — Z13228 Encounter for screening for other metabolic disorders: Secondary | ICD-10-CM

## 2014-02-10 NOTE — Assessment & Plan Note (Signed)
Work on diet and exercise, short term goal set

## 2014-02-10 NOTE — Telephone Encounter (Signed)
How many milligrams for Vitamin D does need to take she does not remember what Dr Buelah Manis told her Call back number is [phone]

## 2014-02-10 NOTE — Assessment & Plan Note (Signed)
Controlled on dilantin

## 2014-02-10 NOTE — Assessment & Plan Note (Signed)
Repeat FLP and LFT Continue lipitor

## 2014-02-10 NOTE — Assessment & Plan Note (Signed)
Repeat B12 level , takes OTC supplements

## 2014-02-10 NOTE — Progress Notes (Signed)
Patient ID: Lisa [name], female   DOB: May 16, 1961, 53 y.o.   MRN: [id number]   Subjective:    Patient ID: Lisa [name], female    DOB: 1961/09/11, 53 y.o.   MRN: [id number]  Patient presents for 9 mos follow up and Medication Refill  Pt here for medication refill. No specific concerns. Has gained some weight due to lack of exercise and late eating with changes in job. Will be starting new exercise program.  Hyperlipidemia- last FLP done showed significant improvement with lipitor in Oct, due to LFT and repeat panel GYN exam UTD, she has not scheduled colonoscopy yet She needs repeat HepC screen due to husbands positive Hep C status    Review Of Systems:  GEN- denies fatigue, fever, weight loss,weakness, recent illness HEENT- denies eye drainage, change in vision, nasal discharge, CVS- denies chest pain, palpitations RESP- denies SOB, cough, wheeze ABD- denies N/V, change in stools, abd pain GU- denies dysuria, hematuria, dribbling, incontinence MSK- denies joint pain, muscle aches, injury Neuro- denies headache, dizziness, syncope, seizure activity       Objective:    BP 110/70  Pulse 78  Temp(Src) 98.2 F (36.8 C) (Oral)  Resp 18  Ht 5' (1.524 m)  Wt 178 lb (80.74 kg)  BMI 34.76 kg/m2 GEN- NAD, alert and oriented x3 HEENT- PERRL, EOMI, non injected sclera, pink conjunctiva, MMM, oropharynx clear Neck- Supple, no thyromegaly CVS- RRR, no murmur RESP-CTAB EXT- No edema Pulses- Radial, DP- 2+        Assessment & Plan:      Problem List Items Addressed This Visit   None      Note: This dictation was prepared with Dragon dictation along with smaller phrase technology. Any transcriptional errors that result from this process are unintentional.

## 2014-02-10 NOTE — Telephone Encounter (Signed)
Pt was called informed of dose of med

## 2014-02-10 NOTE — Patient Instructions (Signed)
Continue current medications Get labs done fasting  Schedule your colonoscopy Continue to work on diet and exercise I will fax labs to [facility]-  Nurse) [phone] Start calcium (1000mg ) and Vitamin D (800iu) F/U 1 year or as needed

## 2014-03-02 ENCOUNTER — Telehealth: Payer: Self-pay | Admitting: Family Medicine

## 2014-03-02 MED ORDER — VITAMIN D (ERGOCALCIFEROL) 1.25 MG (50000 UNIT) PO CAPS
50000.0000 [IU] | ORAL_CAPSULE | ORAL | Status: DC
Start: 2014-03-02 — End: 2015-07-22

## 2014-03-02 NOTE — Telephone Encounter (Signed)
Labs scanned into chart   Please let pt know- Vitamin D is low again, 12 weeks of once a week vitamin D prescribed   Her B12 is also low again- we have 2 options, she can start monthly injections or take 1031mcg of B12 by mouth daily which can be found over the counter  Her Hepatitis Screen is NEGATIVE  Her cholesterol is improved, LDL is now at 109 and TC 198 Continue lipitor at current dose Kidney and liver function is normal

## 2014-03-03 NOTE — Telephone Encounter (Signed)
Call placed to patient and patient made aware.  

## 2014-03-13 ENCOUNTER — Other Ambulatory Visit: Payer: Self-pay | Admitting: Family Medicine

## 2014-03-15 NOTE — Telephone Encounter (Signed)
Refill appropriate and filled per protocol. 

## 2014-06-22 ENCOUNTER — Other Ambulatory Visit: Payer: Self-pay | Admitting: Family Medicine

## 2014-07-08 ENCOUNTER — Other Ambulatory Visit: Payer: Self-pay | Admitting: Obstetrics and Gynecology

## 2014-07-08 DIAGNOSIS — Z1231 Encounter for screening mammogram for malignant neoplasm of breast: Secondary | ICD-10-CM

## 2014-07-19 ENCOUNTER — Ambulatory Visit (HOSPITAL_COMMUNITY)
Admission: RE | Admit: 2014-07-19 | Discharge: 2014-07-19 | Disposition: A | Discharge status: Home / Self Care | Payer: BC Managed Care – PPO | Source: Ambulatory Visit | Attending: Obstetrics and Gynecology | Admitting: Obstetrics and Gynecology

## 2014-07-19 DIAGNOSIS — Z1231 Encounter for screening mammogram for malignant neoplasm of breast: Secondary | ICD-10-CM | POA: Insufficient documentation

## 2014-07-25 ENCOUNTER — Other Ambulatory Visit: Payer: Self-pay | Admitting: Family Medicine

## 2014-07-26 ENCOUNTER — Encounter: Payer: Self-pay | Admitting: Family Medicine

## 2014-07-26 NOTE — Telephone Encounter (Signed)
Pt never returned for her fasting lab work in February and is now due for another routine OV.  One refill sent and letter to patient NTBS

## 2014-07-30 ENCOUNTER — Telehealth: Payer: Self-pay | Admitting: *Deleted

## 2014-07-30 NOTE — Telephone Encounter (Signed)
Lab orders faxed to requested number.

## 2014-07-30 NOTE — Telephone Encounter (Signed)
Pt called to give you different number for order of bloodwork that she spoke to you about. The new fax number is [phone].

## 2014-08-13 ENCOUNTER — Other Ambulatory Visit: Payer: Self-pay | Admitting: *Deleted

## 2014-08-13 DIAGNOSIS — E538 Deficiency of other specified B group vitamins: Secondary | ICD-10-CM

## 2014-08-13 MED ORDER — VITAMIN B-12 100 MCG PO TABS: 100.0000 ug | ORAL_TABLET | Freq: Every day | ORAL | Status: DC

## 2014-08-29 ENCOUNTER — Other Ambulatory Visit: Payer: Self-pay | Admitting: Family Medicine

## 2014-08-30 NOTE — Telephone Encounter (Signed)
Refill appropriate and filled per protocol. 

## 2014-09-24 ENCOUNTER — Other Ambulatory Visit: Payer: BC Managed Care – PPO | Admitting: Adult Health

## 2014-10-15 ENCOUNTER — Other Ambulatory Visit: Payer: BC Managed Care – PPO | Admitting: Adult Health

## 2014-10-19 ENCOUNTER — Other Ambulatory Visit: Payer: BC Managed Care – PPO | Admitting: Adult Health

## 2014-10-26 ENCOUNTER — Other Ambulatory Visit: Payer: BC Managed Care – PPO | Admitting: Adult Health

## 2014-11-01 ENCOUNTER — Encounter: Payer: Self-pay | Admitting: Family Medicine

## 2014-11-02 ENCOUNTER — Other Ambulatory Visit: Payer: BC Managed Care – PPO | Admitting: Adult Health

## 2014-12-07 ENCOUNTER — Telehealth: Payer: Self-pay | Admitting: *Deleted

## 2014-12-07 NOTE — Telephone Encounter (Signed)
Received call from patient.   Reports that she was given FMLA during her spouse's cancer treatments. States that spouse passed away and she has returned to work.   Reports that she feels like she needs more time off to grieve, but HR requires letter from PCP.   Advised that PCP did not fill out FMLA papers and patient would need to come in for OV.   Also advised to have HR fax over new forms and job description.   Patient then stated HR would not go through all that and hung up the phone.   MD aware.

## 2014-12-22 ENCOUNTER — Ambulatory Visit (INDEPENDENT_AMBULATORY_CARE_PROVIDER_SITE_OTHER): Payer: BC Managed Care – PPO | Admitting: Adult Health

## 2014-12-22 ENCOUNTER — Encounter: Payer: Self-pay | Admitting: Adult Health

## 2014-12-22 VITALS — BP 156/90 | HR 78 | Ht 62.25 in | Wt 192.0 lb

## 2014-12-22 DIAGNOSIS — Z139 Encounter for screening, unspecified: Secondary | ICD-10-CM

## 2014-12-22 DIAGNOSIS — R635 Abnormal weight gain: Secondary | ICD-10-CM

## 2014-12-22 DIAGNOSIS — Z1212 Encounter for screening for malignant neoplasm of rectum: Secondary | ICD-10-CM

## 2014-12-22 DIAGNOSIS — F419 Anxiety disorder, unspecified: Secondary | ICD-10-CM

## 2014-12-22 DIAGNOSIS — I10 Essential (primary) hypertension: Secondary | ICD-10-CM

## 2014-12-22 DIAGNOSIS — Z01419 Encounter for gynecological examination (general) (routine) without abnormal findings: Secondary | ICD-10-CM

## 2014-12-22 LAB — HEMOCCULT GUIAC POC 1CARD (OFFICE): Fecal Occult Blood, POC: NEGATIVE

## 2014-12-22 MED ORDER — HYDROCHLOROTHIAZIDE 12.5 MG PO CAPS: 12.5000 mg | ORAL_CAPSULE | Freq: Every day | ORAL | Status: DC

## 2014-12-22 NOTE — Patient Instructions (Signed)
Pap and physical in  1 year Mammogram yearly  Referred to Dr Laural Golden for colonoscopy Hypertension Hypertension, commonly called high blood pressure, is when the force of blood pumping through your arteries is too strong. Your arteries are the blood vessels that carry blood from your heart throughout your body. A blood pressure reading consists of a higher number over a lower number, such as 110/72. The higher number (systolic) is the pressure inside your arteries when your heart pumps. The lower number (diastolic) is the pressure inside your arteries when your heart relaxes. Ideally you want your blood pressure below 120/80. Hypertension forces your heart to work harder to pump blood. Your arteries may become narrow or stiff. Having hypertension puts you at risk for heart disease, stroke, and other problems.  RISK FACTORS Some risk factors for high blood pressure are controllable. Others are not.  Risk factors you cannot control include:   Race. You may be at higher risk if you are African American.  Age. Risk increases with age.  Gender. Men are at higher risk than women before age 45 years. After age 36, women are at higher risk than men. Risk factors you can control include:  Not getting enough exercise or physical activity.  Being overweight.  Getting too much fat, sugar, calories, or salt in your diet.  Drinking too much alcohol. SIGNS AND SYMPTOMS Hypertension does not usually cause signs or symptoms. Extremely high blood pressure (hypertensive crisis) may cause headache, anxiety, shortness of breath, and nosebleed. DIAGNOSIS  To check if you have hypertension, your health care provider will measure your blood pressure while you are seated, with your arm held at the level of your heart. It should be measured at least twice using the same arm. Certain conditions can cause a difference in blood pressure between your right and left arms. A blood pressure reading that is higher than  normal on one occasion does not mean that you need treatment. If one blood pressure reading is high, ask your health care provider about having it checked again. TREATMENT  Treating high blood pressure includes making lifestyle changes and possibly taking medicine. Living a healthy lifestyle can help lower high blood pressure. You may need to change some of your habits. Lifestyle changes may include:  Following the DASH diet. This diet is high in fruits, vegetables, and whole grains. It is low in salt, red meat, and added sugars.  Getting at least 2 hours of brisk physical activity every week.  Losing weight if necessary.  Not smoking.  Limiting alcoholic beverages.  Learning ways to reduce stress. If lifestyle changes are not enough to get your blood pressure under control, your health care provider may prescribe medicine. You may need to take more than one. Work closely with your health care provider to understand the risks and benefits. HOME CARE INSTRUCTIONS  Have your blood pressure rechecked as directed by your health care provider.   Take medicines only as directed by your health care provider. Follow the directions carefully. Blood pressure medicines must be taken as prescribed. The medicine does not work as well when you skip doses. Skipping doses also puts you at risk for problems.   Do not smoke.   Monitor your blood pressure at home as directed by your health care provider. SEEK MEDICAL CARE IF:   You think you are having a reaction to medicines taken.  You have recurrent headaches or feel dizzy.  You have swelling in your ankles.  You have trouble  with your vision. SEEK IMMEDIATE MEDICAL CARE IF:  You develop a severe headache or confusion.  You have unusual weakness, numbness, or feel faint.  You have severe chest or abdominal pain.  You vomit repeatedly.  You have trouble breathing. MAKE SURE YOU:   Understand these instructions.  Will watch your  condition.  Will get help right away if you are not doing well or get worse. Document Released: 12/17/2005 Document Revised: 05/03/2014 Document Reviewed: 10/09/2013 [facility] Patient Information 2015 [place], [place]. This information is not intended to replace advice given to you by your health care provider. Make sure you discuss any questions you have with your health care provider.  Follow up with Dr Buelah Manis TRY WHOLE 30

## 2014-12-22 NOTE — Progress Notes (Signed)
Patient ID: Lisa [name], female   DOB: 02-20-1961, 53 y.o.   MRN: [id number] History of Present Illness: Lisa [name] is a 53 year old black female, recently widowed, in for gyn exam.She is complaining of weight gain.Had normal pap with negative HPV 08/26/12.   Current Medications, Allergies, Past Medical History, Past Surgical History, Family History and Social History were reviewed in [employer] record.     Review of Systems: Patient denies any headaches, blurred vision, shortness of breath, chest pain, abdominal pain, problems with bowel movements, urination, or intercourse. Not currently having sex, no joint pain, teary still, but is going to Grief Share.See HPI. Her husband died 12/23/2023 from pancreatic cancer.    Physical Exam:BP 156/90 mmHg  Pulse 78  Ht 5' 2.25" (1.581 m)  Wt 192 lb (87.091 kg)  BMI 34.84 kg/m2BP recheck was 160/90 right arm General:  Well developed, well nourished, no acute distress Skin:  Warm and dry Neck:  Midline trachea, normal thyroid Lungs; Clear to auscultation bilaterally Breast:  No dominant palpable mass, retraction, or nipple discharge Cardiovascular: Regular rate and rhythm Abdomen:  Soft, non tender, no hepatosplenomegaly Pelvic:  External genitalia is normal in appearance.  The vagina is normal in appearance.  The cervix is bulbous.  Uterus is absent.  No  adnexal masses or tenderness noted. Rectal: Good sphincter tone, no polyps, or hemorrhoids felt.  Hemoccult negative. Extremities:  No swelling or varicosities noted Psych: teary at times,alert and cooperative,seems to be coping but feels like not focused at work, will see Dr Buelah Manis about [employer].Declines meds at present.   Impression: Well woman gyn exam no pap Hypertension Weight gain Anxiety/grief reaction   Plan: Rx microzide 12.5 mg #30 1 daily with 11 refills Follow up with Dr Buelah Manis about BP and Grief Pap and physical in 1 year Mammogram yearly Referred to  Dr Laural Golden for colonoscopy Try WHOLE 30 for weight loss

## 2014-12-25 ENCOUNTER — Other Ambulatory Visit: Payer: Self-pay | Admitting: Family Medicine

## 2014-12-27 ENCOUNTER — Encounter (INDEPENDENT_AMBULATORY_CARE_PROVIDER_SITE_OTHER): Payer: Self-pay | Admitting: *Deleted

## 2015-01-07 ENCOUNTER — Ambulatory Visit (INDEPENDENT_AMBULATORY_CARE_PROVIDER_SITE_OTHER): Payer: BLUE CROSS/BLUE SHIELD | Admitting: Family Medicine

## 2015-01-07 ENCOUNTER — Encounter: Payer: Self-pay | Admitting: Family Medicine

## 2015-01-07 VITALS — BP 152/90 | HR 78 | Temp 99.2°F | Resp 16 | Ht 62.0 in | Wt 188.0 lb

## 2015-01-07 DIAGNOSIS — F4320 Adjustment disorder, unspecified: Secondary | ICD-10-CM

## 2015-01-07 DIAGNOSIS — M545 Low back pain, unspecified: Secondary | ICD-10-CM

## 2015-01-07 DIAGNOSIS — F4323 Adjustment disorder with mixed anxiety and depressed mood: Secondary | ICD-10-CM

## 2015-01-07 DIAGNOSIS — F4321 Adjustment disorder with depressed mood: Secondary | ICD-10-CM | POA: Diagnosis not present

## 2015-01-07 DIAGNOSIS — F411 Generalized anxiety disorder: Secondary | ICD-10-CM | POA: Diagnosis not present

## 2015-01-07 DIAGNOSIS — I10 Essential (primary) hypertension: Secondary | ICD-10-CM | POA: Diagnosis not present

## 2015-01-07 MED ORDER — LORAZEPAM 1 MG PO TABS: 1.0000 mg | ORAL_TABLET | Freq: Two times a day (BID) | ORAL | Status: DC | PRN

## 2015-01-07 MED ORDER — CYCLOBENZAPRINE HCL 10 MG PO TABS: 10.0000 mg | ORAL_TABLET | Freq: Three times a day (TID) | ORAL | Status: DC | PRN

## 2015-01-07 MED ORDER — HYDROCHLOROTHIAZIDE 25 MG PO TABS: 25.0000 mg | ORAL_TABLET | Freq: Every day | ORAL | Status: DC

## 2015-01-07 NOTE — Patient Instructions (Addendum)
Take flexeril for your back Ibuprofen with food HCTZ increased to 25mg  one a day  FMLA Jan 18- Feb 28 Get fasting labs, check Vitamin D, and B12  F/U 4 weeks

## 2015-01-09 DIAGNOSIS — F4320 Adjustment disorder, unspecified: Secondary | ICD-10-CM | POA: Insufficient documentation

## 2015-01-09 DIAGNOSIS — F411 Generalized anxiety disorder: Secondary | ICD-10-CM | POA: Insufficient documentation

## 2015-01-09 DIAGNOSIS — I10 Essential (primary) hypertension: Secondary | ICD-10-CM | POA: Insufficient documentation

## 2015-01-09 DIAGNOSIS — F4321 Adjustment disorder with depressed mood: Secondary | ICD-10-CM | POA: Insufficient documentation

## 2015-01-09 DIAGNOSIS — F4323 Adjustment disorder with mixed anxiety and depressed mood: Secondary | ICD-10-CM | POA: Insufficient documentation

## 2015-01-09 DIAGNOSIS — M545 Low back pain, unspecified: Secondary | ICD-10-CM | POA: Insufficient documentation

## 2015-01-09 NOTE — Assessment & Plan Note (Addendum)
Will given ativan prn I recommend some time out of work to help get things together also to pursue grief counseling and much needed support from family FMLA to be completed, will start on the 18th We will see her back in intermin

## 2015-01-09 NOTE — Assessment & Plan Note (Addendum)
Flexeril, ibuprofen prn msk strain

## 2015-01-09 NOTE — Progress Notes (Signed)
Patient ID: Lisa [name], female   DOB: [birth year], 54 y.o.   MRN: [id number]   Subjective:    Patient ID: Lisa [name], female    DOB: 06-Apr-1961, 54 y.o.   MRN: [id number]  Patient presents for HTN; FMLA; and Sinus Infection  Pt here to discuss Leave from work, her husband passed away in 2023/12/15 from hepatitis C and pancreatic cancer, she took some time off at that is having difficulty with grief, focus, sleep and has not been very functional at work. She is now in the process of moving into a new home and selling their old home and things are very overwhelming. She is in grief counseling.  HTN- seen by GYN and BP was quite high started on HCTZ 12.5mg  once a day   Seen at UC this week for sinus infection, completed zpak, still has some congestion but improved  Low back pain for past few months, has been sleeping mostly at the hospital or in a recliner while her husband was hospitlized. No change in bowel or bladder no radiating syptoms   Review Of Systems:  GEN- + fatigue, denies  fever, weight loss,weakness, recent illness HEENT- denies eye drainage, change in vision,+ nasal discharge, CVS- denies chest pain, palpitations RESP- denies SOB, cough, wheeze ABD- denies N/V, change in stools, abd pain GU- denies dysuria, hematuria, dribbling, incontinence MSK- + joint pain, muscle aches, injury Neuro- denies headache, dizziness, syncope, seizure activity       Objective:    BP 152/90 mmHg  Pulse 78  Temp(Src) 99.2 F (37.3 C) (Oral)  Resp 16  Ht 5\' 2"  (1.575 m)  Wt 188 lb (85.276 kg)  BMI 34.38 kg/m2 GEN- NAD, alert and oriented x3 HEENT- PERRL, EOMI, non injected sclera, pink conjunctiva, MMM, oropharynx clear, no maxillary sinus tenderness, nare clear rhinorrhea, enlarged turbinates Neck- Supple, no LAD CVS- RRR, no murmur RESP-CTAB ABD-NABS,soft,NT,ND  MSK- Mild TTP lumbar spine, spasm of parspinals, neg SLR, good ROM Psych- normal affect but appears fatigied from  her baseline, not overly anxious EXT- No edema Pulses- Radial, DP- 2+        Assessment & Plan:      Problem List Items Addressed This Visit    None      Note: This dictation was prepared with Dragon dictation along with smaller phrase technology. Any transcriptional errors that result from this process are unintentional.

## 2015-01-09 NOTE — Assessment & Plan Note (Signed)
HCTZ increased to 25mg 

## 2015-01-10 ENCOUNTER — Telehealth: Payer: Self-pay | Admitting: *Deleted

## 2015-01-10 NOTE — Telephone Encounter (Signed)
Received FMLA paperwork on my desk ready for pick up, I Called pt to ask if there was a fax number to send these to and was given [phone] to attn Yvone Neu, these were faxed to here at pts request.

## 2015-01-12 ENCOUNTER — Telehealth: Payer: Self-pay | Admitting: Family Medicine

## 2015-01-12 NOTE — Telephone Encounter (Signed)
Call placed to patient to inquire. LMTRC. 

## 2015-01-12 NOTE — Telephone Encounter (Signed)
Patient is calling to see if dr Buelah Manis would be willing to fill out an accident form for her? Please call her at [phone]

## 2015-01-14 NOTE — Telephone Encounter (Signed)
Call placed to patient.   Reports that she has used all FMLA time, so she does not need form.

## 2015-01-25 ENCOUNTER — Telehealth: Payer: Self-pay | Admitting: *Deleted

## 2015-01-25 MED ORDER — ATORVASTATIN CALCIUM 20 MG PO TABS: 20.0000 mg | ORAL_TABLET | Freq: Every day | ORAL | Status: DC

## 2015-01-25 NOTE — Telephone Encounter (Signed)
Received patient lab results.   MD reviewed and noted cholesterol has increased.   New orders obtained to increase Lipitor to 20mg  PO QHS.   Call placed to patient and patient made aware.   Prescription sent to pharmacy.

## 2015-02-04 ENCOUNTER — Encounter: Payer: Self-pay | Admitting: Family Medicine

## 2015-02-04 ENCOUNTER — Ambulatory Visit (INDEPENDENT_AMBULATORY_CARE_PROVIDER_SITE_OTHER): Payer: BLUE CROSS/BLUE SHIELD | Admitting: Family Medicine

## 2015-02-04 VITALS — BP 136/74 | HR 78 | Temp 98.3°F | Resp 16 | Ht 62.0 in | Wt 183.0 lb

## 2015-02-04 DIAGNOSIS — F411 Generalized anxiety disorder: Secondary | ICD-10-CM

## 2015-02-04 DIAGNOSIS — M545 Low back pain, unspecified: Secondary | ICD-10-CM

## 2015-02-04 DIAGNOSIS — F4323 Adjustment disorder with mixed anxiety and depressed mood: Secondary | ICD-10-CM | POA: Diagnosis not present

## 2015-02-04 DIAGNOSIS — E785 Hyperlipidemia, unspecified: Secondary | ICD-10-CM | POA: Diagnosis not present

## 2015-02-04 DIAGNOSIS — I10 Essential (primary) hypertension: Secondary | ICD-10-CM

## 2015-02-04 NOTE — Assessment & Plan Note (Signed)
lipitor increased to 20mg , she will recheck in 3 months at her job and fax to me

## 2015-02-04 NOTE — Assessment & Plan Note (Addendum)
Continue with PRN ativan Note her FMLA can not be redone until April, we will do at that time if needed

## 2015-02-04 NOTE — Patient Instructions (Signed)
Take 1/2 tablet of the muscle relaxer Get xrays  Continue HCTZ  F/U 4 months

## 2015-02-04 NOTE — Assessment & Plan Note (Signed)
Xrays of l spine, try 5mg  of flexeril, no red flags, continue with masseuse and chiroprater, weight loss is also helping

## 2015-02-04 NOTE — Assessment & Plan Note (Signed)
BP much improved, continue current meds.

## 2015-02-04 NOTE — Progress Notes (Signed)
Patient ID: Lisa [name], female   DOB: 1961-03-11, 54 y.o.   MRN: [id number]   Subjective:    Patient ID: Lisa [name], female    DOB: 09/22/1961, 54 y.o.   MRN: [id number]  Patient presents for 4 week F/U patient here for interim follow-up. Unfortunately she did not have enough FMLA on file to be out of work. They did however decrease her case load which is helped with her stress. She did fairly well with Ativan she does not use on a regular basis but it does help calm her down. She is also taking her blood pressure medicines as prescribed to increase her hydrochlorothiazide to 25 mg and she has intentionally lost about 10 pounds since her last visit.  She continues to have significant lower back pain she is using the muscle relaxer which helps but makes her very somnolent into the next day. She also has an appointment scheduled with a chiropractor and a massage therapist. She denies any radiating pain but it is bilateral and she does continue to get some spasm.    Review Of Systems:  GEN- denies fatigue, fever, weight loss,weakness, recent illness HEENT- denies eye drainage, change in vision, nasal discharge, CVS- denies chest pain, palpitations RESP- denies SOB, cough, wheeze ABD- denies N/V, change in stools, abd pain GU- denies dysuria, hematuria, dribbling, incontinence MSK- + joint pain, muscle aches, injury Neuro- denies headache, dizziness, syncope, seizure activity       Objective:    BP 136/74 mmHg  Pulse 78  Temp(Src) 98.3 F (36.8 C) (Oral)  Resp 16  Ht 5\' 2"  (1.575 m)  Wt 183 lb (83.008 kg)  BMI 33.46 kg/m2 GEN- NAD, alert and oriented x3 CVS- RRR, no murmur RESP-CTAB Psych- normal affect and mood, no SI, well groomed EXT- No edema Pulses- Radial 2+        Assessment & Plan:      Problem List Items Addressed This Visit      Unprioritized   Lumbago   Relevant Orders   DG Lumbar Spine Complete   Essential hypertension - Primary   Anxiety  state   Adjustment disorder with mixed anxiety and depressed mood      Note: This dictation was prepared with Dragon dictation along with smaller phrase technology. Any transcriptional errors that result from this process are unintentional.

## 2015-03-02 ENCOUNTER — Other Ambulatory Visit: Payer: Self-pay | Admitting: Family Medicine

## 2015-03-03 MED ORDER — VITAMIN D3 50 MCG (2000 UT) PO CAPS: 2000.0000 [IU] | ORAL_CAPSULE | Freq: Every day | ORAL | Status: DC

## 2015-03-03 NOTE — Telephone Encounter (Deleted)
Refill appropriate and filled per protocol. 

## 2015-03-03 NOTE — Telephone Encounter (Signed)
Refill denied.   Patient completed 12 week course. Order sent to pharmacy for OTC Vitamin D.

## 2015-06-10 ENCOUNTER — Ambulatory Visit: Payer: BLUE CROSS/BLUE SHIELD | Admitting: Family Medicine

## 2015-07-14 ENCOUNTER — Other Ambulatory Visit: Payer: Self-pay | Admitting: Family Medicine

## 2015-07-14 DIAGNOSIS — Z1231 Encounter for screening mammogram for malignant neoplasm of breast: Secondary | ICD-10-CM

## 2015-07-22 ENCOUNTER — Ambulatory Visit (INDEPENDENT_AMBULATORY_CARE_PROVIDER_SITE_OTHER): Payer: BLUE CROSS/BLUE SHIELD | Admitting: Family Medicine

## 2015-07-22 ENCOUNTER — Encounter: Payer: Self-pay | Admitting: Family Medicine

## 2015-07-22 ENCOUNTER — Ambulatory Visit: Payer: BLUE CROSS/BLUE SHIELD | Admitting: Family Medicine

## 2015-07-22 ENCOUNTER — Telehealth: Payer: Self-pay | Admitting: Family Medicine

## 2015-07-22 VITALS — BP 128/64 | HR 78 | Temp 98.5°F | Resp 16 | Ht 62.0 in | Wt 194.0 lb

## 2015-07-22 DIAGNOSIS — E785 Hyperlipidemia, unspecified: Secondary | ICD-10-CM | POA: Diagnosis not present

## 2015-07-22 DIAGNOSIS — E559 Vitamin D deficiency, unspecified: Secondary | ICD-10-CM | POA: Diagnosis not present

## 2015-07-22 DIAGNOSIS — B372 Candidiasis of skin and nail: Secondary | ICD-10-CM | POA: Diagnosis not present

## 2015-07-22 DIAGNOSIS — I10 Essential (primary) hypertension: Secondary | ICD-10-CM

## 2015-07-22 DIAGNOSIS — E538 Deficiency of other specified B group vitamins: Secondary | ICD-10-CM | POA: Diagnosis not present

## 2015-07-22 DIAGNOSIS — Z79899 Other long term (current) drug therapy: Secondary | ICD-10-CM | POA: Diagnosis not present

## 2015-07-22 DIAGNOSIS — E669 Obesity, unspecified: Secondary | ICD-10-CM | POA: Diagnosis not present

## 2015-07-22 DIAGNOSIS — M65311 Trigger thumb, right thumb: Secondary | ICD-10-CM

## 2015-07-22 MED ORDER — SCOPOLAMINE 1 MG/3DAYS TD PT72: 1.0000 | MEDICATED_PATCH | TRANSDERMAL | Status: DC

## 2015-07-22 MED ORDER — HYDROCHLOROTHIAZIDE 25 MG PO TABS: 25.0000 mg | ORAL_TABLET | Freq: Every day | ORAL | Status: DC

## 2015-07-22 MED ORDER — FLUCONAZOLE 150 MG PO TABS: 150.0000 mg | ORAL_TABLET | Freq: Once | ORAL | Status: DC

## 2015-07-22 MED ORDER — SOLIFENACIN SUCCINATE 10 MG PO TABS: 10.0000 mg | ORAL_TABLET | ORAL | Status: DC

## 2015-07-22 MED ORDER — VITAMIN B-12 100 MCG PO TABS: 100.0000 ug | ORAL_TABLET | Freq: Every day | ORAL | Status: DC

## 2015-07-22 MED ORDER — PHENYTOIN SODIUM EXTENDED 100 MG PO CAPS: 200.0000 mg | ORAL_CAPSULE | Freq: Every evening | ORAL | Status: DC

## 2015-07-22 MED ORDER — VITAMIN D3 50 MCG (2000 UT) PO CAPS: 2000.0000 [IU] | ORAL_CAPSULE | Freq: Every day | ORAL | Status: DC

## 2015-07-22 MED ORDER — NYSTATIN 100000 UNIT/GM EX POWD: CUTANEOUS | Status: DC

## 2015-07-22 MED ORDER — CETIRIZINE HCL 10 MG PO TABS: 10.0000 mg | ORAL_TABLET | Freq: Every day | ORAL | Status: DC

## 2015-07-22 MED ORDER — CYCLOBENZAPRINE HCL 10 MG PO TABS: 10.0000 mg | ORAL_TABLET | Freq: Three times a day (TID) | ORAL | Status: DC | PRN

## 2015-07-22 MED ORDER — ATORVASTATIN CALCIUM 20 MG PO TABS: 20.0000 mg | ORAL_TABLET | Freq: Every day | ORAL | Status: DC

## 2015-07-22 NOTE — Telephone Encounter (Signed)
RX to pharmacy, pt aware. 

## 2015-07-22 NOTE — Progress Notes (Signed)
Patient ID: Lisa [name], female   DOB: Aug 13, 1961, 54 y.o.   MRN: [id number]   Subjective:    Patient ID: Lisa [name], female    DOB: 10/04/1961, 54 y.o.   MRN: [id number]  Patient presents for F/U  patient here for follow-up. She's been doing well back at work since her husband died. She rarely uses the lorazepam. She has a very good support group. She is now concentrating on getting her health back on track and 1 thing she is concerned about is her weight. She has tried different diet plans in the past as well as cutting carbs however she will lose some weight and then regain the weight back if not more. She will like to have a consultation with the bariatric surgeon to discuss lap band.  She also noticed that she has a itchy moist rash beneath her breasts and also gets it in her groin region this is been on and off for the past couple months.  She is a problem with her right thumb where she did get stuck and she has to pry backup this is occurring more more often and often she gets swelling of the joint she will like to have this evaluated  She is going on at trip and requested a motion sickness patch.  She is due for fasting labs  Review Of Systems:  GEN- denies fatigue, fever, weight loss,weakness, recent illness HEENT- denies eye drainage, change in vision, nasal discharge, CVS- denies chest pain, palpitations RESP- denies SOB, cough, wheeze ABD- denies N/V, change in stools, abd pain GU- denies dysuria, hematuria, dribbling, incontinence MSK- + joint pain, muscle aches, injury Neuro- denies headache, dizziness, syncope, seizure activity       Objective:    BP 128/64 mmHg  Pulse 78  Temp(Src) 98.5 F (36.9 C) (Oral)  Resp 16  Ht 5\' 2"  (1.575 m)  Wt 194 lb (87.998 kg)  BMI 35.47 kg/m2 GEN- NAD, alert and oriented x3 HEENT- PERRL, EOMI, non injected sclera, pink conjunctiva, MMM, oropharynx clear Neck- Supple, no thyromegaly CVS- RRR, no  murmur RESP-CTAB ABD-NABS,soft,NT,ND MSK- Right hand/thumb- pain with flexion, did not lock during exam, no swelling noted  Skin- erythema and moisture beneath breast, no odor  EXT- No edema Pulses- Radial, DP- 2+        Assessment & Plan:      Problem List Items Addressed This Visit    Obese    Refer to bariatic clinic for consultation, has tried home dieting, [employer] like programs, exercise programs Refer to nutritionist       Hyperlipidemia   Relevant Medications   atorvastatin (LIPITOR) 20 MG tablet   hydrochlorothiazide (HYDRODIURIL) 25 MG tablet   Other Relevant Orders   Lipid panel   Essential hypertension - Primary    Well controlled      Relevant Medications   atorvastatin (LIPITOR) 20 MG tablet   hydrochlorothiazide (HYDRODIURIL) 25 MG tablet   Other Relevant Orders   CBC with Differential/Platelet   Comprehensive metabolic panel   TSH   X32 deficiency    Check levels      Relevant Orders   Vitamin B12    Other Visit Diagnoses    Long-term use of high-risk medication        Dilantin level to be done    Relevant Orders    Phenytoin level, total and free    Vitamin D deficiency        Relevant Orders    Vitamin  D, 25-hydroxy    Candidal intertrigo        Topical nystatin powder    Relevant Medications    nystatin (MYCOSTATIN/NYSTOP) 100000 UNIT/GM POWD       Note: This dictation was prepared with Dragon dictation along with smaller phrase technology. Any transcriptional errors that result from this process are unintentional.

## 2015-07-22 NOTE — Telephone Encounter (Signed)
Asking for a Diflucan??  A pill for "down there"  Please advise?

## 2015-07-22 NOTE — Telephone Encounter (Signed)
Okay to send in Diflucan x 1 dose

## 2015-07-22 NOTE — Patient Instructions (Addendum)
Referral to nutrionist  Get the labs done fasting Referral to hand specialist - [place] Use nystatin powder  Schedule mammogram GYN exam  Colonoscopy  F/U 6  months

## 2015-07-24 NOTE — Assessment & Plan Note (Signed)
Well controlled 

## 2015-07-24 NOTE — Assessment & Plan Note (Signed)
Refer to bariatic clinic for consultation, has tried home dieting, [employer] like programs, exercise programs Refer to nutritionist

## 2015-07-24 NOTE — Assessment & Plan Note (Signed)
Check levels 

## 2015-08-01 ENCOUNTER — Ambulatory Visit (HOSPITAL_COMMUNITY): Payer: Self-pay

## 2015-08-01 ENCOUNTER — Telehealth: Payer: Self-pay | Admitting: Family Medicine

## 2015-08-01 MED ORDER — VITAMIN B-12 100 MCG PO TABS: 100.0000 ug | ORAL_TABLET | Freq: Every day | ORAL | Status: DC

## 2015-08-01 MED ORDER — VITAMIN D3 50 MCG (2000 UT) PO CAPS: 2000.0000 [IU] | ORAL_CAPSULE | Freq: Every day | ORAL | Status: DC

## 2015-08-01 MED ORDER — PHENYTOIN SODIUM EXTENDED 100 MG PO CAPS: 200.0000 mg | ORAL_CAPSULE | Freq: Every evening | ORAL | Status: DC

## 2015-08-01 MED ORDER — ATORVASTATIN CALCIUM 20 MG PO TABS: 20.0000 mg | ORAL_TABLET | Freq: Every day | ORAL | Status: DC

## 2015-08-01 NOTE — Telephone Encounter (Signed)
Patient was seen a couple of Friday's ago and she needs the [facility] [address]. She did state that she got the cholesterol medication this weekend.

## 2015-08-01 NOTE — Telephone Encounter (Signed)
Call placed to patient to verify prescription refills.   Requested refill on Vit B, Vit D, Lipitor and Dilantin to [facility] [address].   Prescription sent to pharmacy.

## 2015-08-02 ENCOUNTER — Telehealth: Payer: Self-pay | Admitting: *Deleted

## 2015-08-02 MED ORDER — SCOPOLAMINE 1 MG/3DAYS TD PT72: 1.0000 | MEDICATED_PATCH | TRANSDERMAL | Status: DC

## 2015-08-02 NOTE — Telephone Encounter (Signed)
Patient returned call and made aware.   Also states that she requires refill on tran-scop patch for cruise. [facility]   Prescription sent to pharmacy.

## 2015-08-02 NOTE — Telephone Encounter (Signed)
Received lab results from patient job.   MD reviewed labs and new recommendations obtained:  Vitamin D is normal (35). Vitamin B12 is normal (553). LFT normal.  Dilantin level is low (3.3), but since patient does not have active seizures, continue same dose (patient has been stable on current dose x years).   No changes to medications.   Call placed to patient. [place].

## 2015-08-18 ENCOUNTER — Other Ambulatory Visit: Payer: Self-pay | Admitting: *Deleted

## 2015-08-18 MED ORDER — HYDROCHLOROTHIAZIDE 25 MG PO TABS: 25.0000 mg | ORAL_TABLET | Freq: Every day | ORAL | Status: DC

## 2015-09-12 ENCOUNTER — Ambulatory Visit (HOSPITAL_COMMUNITY)
Admission: RE | Admit: 2015-09-12 | Discharge: 2015-09-12 | Disposition: A | Discharge status: Home / Self Care | Payer: BLUE CROSS/BLUE SHIELD | Source: Ambulatory Visit | Attending: Family Medicine | Admitting: Family Medicine

## 2015-09-12 DIAGNOSIS — Z1231 Encounter for screening mammogram for malignant neoplasm of breast: Secondary | ICD-10-CM | POA: Insufficient documentation

## 2015-09-15 ENCOUNTER — Encounter: Payer: BLUE CROSS/BLUE SHIELD | Attending: Family Medicine | Admitting: Dietician

## 2015-09-15 ENCOUNTER — Encounter: Payer: Self-pay | Admitting: Dietician

## 2015-09-15 VITALS — Ht 62.0 in | Wt 191.0 lb

## 2015-09-15 DIAGNOSIS — Z713 Dietary counseling and surveillance: Secondary | ICD-10-CM | POA: Diagnosis not present

## 2015-09-15 DIAGNOSIS — E669 Obesity, unspecified: Secondary | ICD-10-CM | POA: Diagnosis not present

## 2015-09-15 DIAGNOSIS — Z6834 Body mass index (BMI) 34.0-34.9, adult: Secondary | ICD-10-CM | POA: Diagnosis not present

## 2015-09-15 NOTE — Patient Instructions (Addendum)
[email or web address]  -Continue to portion out food that you receive from a restaurant -When you cook, make enough for leftovers  -Continue exercise routine!  -Fill up on non starchy vegetables -Veggies are good raw or cooked, fresh or frozen   -Keep healthy snacks available at home  -Yogurt, fruit cups, fresh fruit, egg, cheese, nuts, veggies  -Pre portion snack foods -Continue to use small plates and bowls

## 2015-09-15 NOTE — Progress Notes (Signed)
  Medical Nutrition Therapy:  Appt start time: 1125 end time:  1205   Assessment:  Primary concerns today: Lisa [name] states that she is here to discuss her weight. She states that her weight fluctuates a lot; will lose weight and then regain. 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 Her husband also recently passed away. She has not figured out a routine yet since retirement. She has recently lost a few pounds by increasing exercise and by being "more conscious about what I'm eating." Tries to avoid snack foods. She no longer cooks since her husband passed away. She has been going to [employer] and [employer] more or may have a salad from [employer]. She eats out daily. Feels like she gets plenty of fluid. Uses My Fitness Pal and FitBit to log foods and exercise. She states that she eats about 1400 calories per day per her fit bit. Does not go to the grocery store consistently. She is considering the Lap Band and had a few questions about that today. She has not yet attended a bariatric seminar.  Preferred Learning Style:  No preference indicated   Learning Readiness:   Ready   MEDICATIONS: see list   DIETARY INTAKE: Avoided foods include fried foods, milk (can tolerate yogurt and cheese with lactase).    24-hr recall:   Wakes up around 8 am Drinks hot water with lemon or coffee with 3 Tbsp sugar and creamer B/L (11-11:30 AM): 2 scrambled eggs and 2 pieces bacon OR chicken or fish with veggies   Snk ( PM): sometimes nabs D (7:30-8 PM): meat and vegetables Snk ( PM): not usually  Beverages: hot water with lemon, coffee occasionally, ginger ale, water, 8 oz cranberry juice with meds   Usual physical activity: trying to increase; walking, elliptical, and zumba 2x a week  Estimated energy needs: 1400-1600 calories  Progress Towards Goal(s):  In progress.   Nutritional Diagnosis:  [place]-3.3 Overweight/obesity As related to history of excessive energy intake,  physical inactivity, and inappropriate food choices.  As evidenced by BMI 35.    Intervention:  Nutrition counseling provided. Goals: [employer] -Continue to portion out food that you receive from a restaurant -When you cook, make enough for leftovers -Continue exercise routine! -Fill up on non starchy vegetables -Veggies are good raw or cooked, fresh or frozen  -Keep healthy snacks available at home  -Yogurt, fruit cups, fresh fruit, egg, cheese, nuts, veggies -Pre portion snack foods -Continue to use small plates and bowls  Teaching Method Utilized:  Visual Auditory  Handouts given during visit include:  MyPlate  Barriers to learning/adherence to lifestyle change: none  Demonstrated degree of understanding via:  Teach Back   Monitoring/Evaluation:  Dietary intake, exercise, and body weight in 2 month(s).

## 2015-11-09 ENCOUNTER — Ambulatory Visit (INDEPENDENT_AMBULATORY_CARE_PROVIDER_SITE_OTHER): Payer: BC Managed Care – PPO | Admitting: Family Medicine

## 2015-11-09 ENCOUNTER — Encounter: Payer: Self-pay | Admitting: Family Medicine

## 2015-11-09 VITALS — BP 128/74 | HR 82 | Temp 98.8°F | Resp 16 | Ht 62.0 in | Wt 191.0 lb

## 2015-11-09 DIAGNOSIS — Z1211 Encounter for screening for malignant neoplasm of colon: Secondary | ICD-10-CM | POA: Diagnosis not present

## 2015-11-09 DIAGNOSIS — L918 Other hypertrophic disorders of the skin: Secondary | ICD-10-CM

## 2015-11-09 DIAGNOSIS — J309 Allergic rhinitis, unspecified: Secondary | ICD-10-CM | POA: Diagnosis not present

## 2015-11-09 MED ORDER — TRIAMCINOLONE ACETONIDE 55 MCG/ACT NA AERO: 2.0000 | INHALATION_SPRAY | Freq: Every day | NASAL | Status: DC

## 2015-11-09 MED ORDER — BECLOMETHASONE DIPROPIONATE 80 MCG/ACT NA AERS: INHALATION_SPRAY | NASAL | Status: DC

## 2015-11-09 NOTE — Progress Notes (Signed)
Patient ID: Lisa [name], female   DOB: Nov 08, 1961, 54 y.o.   MRN: [id number]   Subjective:    Patient ID: Lisa [name], female    DOB: [birth year], 54 y.o.   MRN: [id number]  Patient presents for Skin Tag and Congestion  patient here with persistent rhinitis and congestion. She's currently on Zyrtec as well as Flonase. She states that it helps some another day she'll be very congested and ONE TIME heard herself wheezing when he she worked out. She denies any actual shortness of breath no significant cough that is more from the drainage.  She has a skin tag on the left side of her neck that has been present for quite some time but is now being caught on her necklace and her clothing causing pain.    Review Of Systems:  GEN- denies fatigue, fever, weight loss,weakness, recent illness HEENT- denies eye drainage, change in vision, nasal discharge, CVS- denies chest pain, palpitations RESP- denies SOB, cough, wheeze ABD- denies N/V, change in stools, abd pain GU- denies dysuria, hematuria, dribbling, incontinence MSK- denies joint pain, muscle aches, injury Neuro- denies headache, dizziness, syncope, seizure activity       Objective:    BP 128/74 mmHg  Pulse 82  Temp(Src) 98.8 F (37.1 C) (Oral)  Resp 16  Ht 5\' 2"  (1.575 m)  Wt 191 lb (86.637 kg)  BMI 34.93 kg/m2 GEN- NAD, alert and oriented x3 HEENT- PERRL, EOMI, non injected sclera, pink conjunctiva, MMM, oropharynx clear, nares clear rhinorrhea, no maxillary sinus tenderness, enlarged turbinates Neck- Supple, no LAD Skin- hyperpigmented skin tag on left side of neck, NT, no erythema   Procedure- Skin Tag Removal Procedure explained to patient questions answered benefits and risks discussed verbal consent obtained. Antiseptic-betadine Anesthesia-lidocaine 1%, Cold spray  Tag removed at base with razor Minimal blood loss, Drysol  touched to lesion on neck and axilla due to persistent oozing Patient tolerated  procedure well Bandage applied          Assessment & Plan:      Problem List Items Addressed This Visit    Allergic rhinitis    Trial of Qnasal along with Zyrtec. His exam is clear.       Other Visit Diagnoses    Skin tag    -  Primary    S/P REMOVAL AT BEDSIDE, BENIGN LESION       Note: This dictation was prepared with Dragon dictation along with smaller phrase technology. Any transcriptional errors that result from this process are unintentional.

## 2015-11-09 NOTE — Patient Instructions (Signed)
Continue zyrtec Use Qnasl Add mucinex if needed F/U as previous

## 2015-11-09 NOTE — Assessment & Plan Note (Signed)
Trial of Qnasal along with Zyrtec. His exam is clear.

## 2015-11-09 NOTE — Addendum Note (Signed)
Addended by: Sheral Flow on: 11/09/2015 02:39 PM   Modules accepted: Orders

## 2015-11-09 NOTE — Addendum Note (Signed)
Addended by: Sheral Flow on: 11/09/2015 12:23 PM   Modules accepted: Orders

## 2015-11-15 ENCOUNTER — Encounter: Payer: Self-pay | Admitting: Dietician

## 2015-11-15 ENCOUNTER — Encounter: Payer: BLUE CROSS/BLUE SHIELD | Attending: Family Medicine | Admitting: Dietician

## 2015-11-15 VITALS — Ht 62.0 in | Wt 190.0 lb

## 2015-11-15 DIAGNOSIS — Z713 Dietary counseling and surveillance: Secondary | ICD-10-CM | POA: Diagnosis not present

## 2015-11-15 DIAGNOSIS — Z6834 Body mass index (BMI) 34.0-34.9, adult: Secondary | ICD-10-CM | POA: Insufficient documentation

## 2015-11-15 DIAGNOSIS — E669 Obesity, unspecified: Secondary | ICD-10-CM | POA: Insufficient documentation

## 2015-11-15 NOTE — Progress Notes (Signed)
  Medical Nutrition Therapy:  Appt start time: [id number] end time:  1210  Follow up:  Primary concerns today: Lisa [name] returns having lost 1 pound. She states that she is frustrated not to have lost any more weight. Calorie intake is around 1400 calories per day and she states she is satisfied with this, not feeling hungry. Clothes are feeling looser. Part of 3 different walking/step challenges. She has been weighing herself 2x a week. Has been practicing eating half her meal at restaurants.   Preferred Learning Style:  No preference indicated   Learning Readiness:   Ready   MEDICATIONS: see list   DIETARY INTAKE: Avoided foods include fried foods, milk (can tolerate yogurt and cheese with lactase).    24-hr recall:   Wakes up around 8 am Drinks hot water with lemon or coffee with 3 Tbsp sugar and creamer B: [employer] breakfast Delite S: P3 pack  L: chicken or fish with veggies   Snk ( PM): P3 pack D (7:30-8 PM): [employer] chicken salad Snk ( PM): not usually  Beverages: 4-5 bottles of water per day; occasional ginger ale and 8 oz cranberry juice  Usual physical activity: 2 or 3 mile walking program at home; zumba for an hour 3x a week  Estimated energy needs: 1400-1600 calories  Progress Towards Goal(s):  In progress.   Nutritional Diagnosis:  Lisa [name]-3.3 Overweight/obesity As related to history of excessive energy intake, physical inactivity, and inappropriate food choices.  As evidenced by BMI 35.    Intervention:  Nutrition counseling provided.  Teaching Method Utilized:  Visual Auditory   Barriers to learning/adherence to lifestyle change: none  Demonstrated degree of understanding via:  Teach Back   Monitoring/Evaluation:  Dietary intake, exercise, and body weight in 3 month(s).

## 2015-12-08 ENCOUNTER — Telehealth: Payer: Self-pay | Admitting: *Deleted

## 2015-12-08 NOTE — Telephone Encounter (Signed)
Received fax from Dr. Adriana Mccallum office stating that pt has cancelled her 11/30/15 appt and has not rescheduled her appt.

## 2015-12-15 ENCOUNTER — Encounter: Payer: BLUE CROSS/BLUE SHIELD | Admitting: Obstetrics and Gynecology

## 2016-01-03 ENCOUNTER — Ambulatory Visit: Payer: BC Managed Care – PPO | Admitting: Family Medicine

## 2016-01-04 ENCOUNTER — Encounter: Payer: Self-pay | Admitting: Family Medicine

## 2016-01-04 ENCOUNTER — Encounter: Payer: Self-pay | Admitting: Obstetrics and Gynecology

## 2016-01-04 ENCOUNTER — Ambulatory Visit (INDEPENDENT_AMBULATORY_CARE_PROVIDER_SITE_OTHER): Payer: BC Managed Care – PPO | Admitting: Obstetrics and Gynecology

## 2016-01-04 ENCOUNTER — Ambulatory Visit (INDEPENDENT_AMBULATORY_CARE_PROVIDER_SITE_OTHER): Payer: BC Managed Care – PPO | Admitting: Family Medicine

## 2016-01-04 VITALS — BP 130/72 | HR 78 | Temp 98.5°F | Resp 14 | Ht 62.0 in | Wt 189.0 lb

## 2016-01-04 VITALS — BP 140/80 | HR 64 | Resp 18 | Ht 62.0 in | Wt 188.0 lb

## 2016-01-04 DIAGNOSIS — G5603 Carpal tunnel syndrome, bilateral upper limbs: Secondary | ICD-10-CM

## 2016-01-04 DIAGNOSIS — Z01419 Encounter for gynecological examination (general) (routine) without abnormal findings: Secondary | ICD-10-CM | POA: Diagnosis not present

## 2016-01-04 DIAGNOSIS — I10 Essential (primary) hypertension: Secondary | ICD-10-CM

## 2016-01-04 DIAGNOSIS — Z803 Family history of malignant neoplasm of breast: Secondary | ICD-10-CM

## 2016-01-04 DIAGNOSIS — Z1211 Encounter for screening for malignant neoplasm of colon: Secondary | ICD-10-CM | POA: Diagnosis not present

## 2016-01-04 DIAGNOSIS — N951 Menopausal and female climacteric states: Secondary | ICD-10-CM | POA: Diagnosis not present

## 2016-01-04 DIAGNOSIS — Z113 Encounter for screening for infections with a predominantly sexual mode of transmission: Secondary | ICD-10-CM | POA: Diagnosis not present

## 2016-01-04 DIAGNOSIS — H109 Unspecified conjunctivitis: Secondary | ICD-10-CM

## 2016-01-04 LAB — ESTRADIOL: Estradiol: 14.2 pg/mL

## 2016-01-04 LAB — FOLLICLE STIMULATING HORMONE: FSH: 119.7 m[IU]/mL — ABNORMAL HIGH

## 2016-01-04 MED ORDER — GABAPENTIN 300 MG PO CAPS: 300.0000 mg | ORAL_CAPSULE | Freq: Every day | ORAL | Status: DC

## 2016-01-04 MED ORDER — POLYMYXIN B-TRIMETHOPRIM 10000-0.1 UNIT/ML-% OP SOLN: 1.0000 [drp] | Freq: Four times a day (QID) | OPHTHALMIC | Status: DC

## 2016-01-04 NOTE — Assessment & Plan Note (Signed)
Blood pressure well controlled

## 2016-01-04 NOTE — Progress Notes (Signed)
55 y.o. [id number] Married [place] American female here for annual exam.    Status post supracervical abdominal hysterectomy about 10 years ago with Dr. Glo Herring.  Still has cervix and ovaries.  Not on hormone therapy.  Family history of breast cancer of mother and sister at age 38 years old.  No BRCA testing was done.   Some vaginal irritation after intercourse.  Prone to yeast infections. Has some vaginal dryness.  No odor. No itching or burning. Uses lubrication.  Desires STD testing.  Wants hormones tested.  Having hot flashes.  Declines estrogens.   Patient is retired.  Husband died of pancreatic cancer in 03-09-14.  3 sons.   PCP:   Vic Blackbird  Patient's last menstrual period was 12/31/2004 (approximate).          Sexually active: Yes.    The current method of family planning is status post hysterectomy.    Exercising: Yes.    zumba, treadmil Smoker:  Former  Health Maintenance: Pap:  08/26/12 Neg. HR HPV:Neg History of abnormal Pap:  Yes, remote history of abnormal pap after second child born 24 years ago.  Had cryotherapy. Follow up paps normal.  MMG:  09/14/15 BIRADS1:neg Colonoscopy:  Never.  Will arrange through PCP.  Declined this in the past. BMD:   Never  TDaP:  03/09/2014 per pt Screening Labs:  Hb today: PCP, Urine today: PCP   reports that she quit smoking about 19 years ago. Her smoking use included Cigarettes. She has never used smokeless tobacco. She reports that she drinks alcohol. She reports that she does not use illicit drugs.  Past Medical History  Diagnosis Date  . Seizures ([place])   . Carpal tunnel syndrome of right wrist   . Low back pain   . Elevated cholesterol   . OAB (overactive bladder)     Past Surgical History  Procedure Laterality Date  . Ectopic pregnancy surgery    . Cesarean section    . Abdominal hysterectomy      supracervical hysterectomy.  Ovaries remain.    Current Outpatient Prescriptions  Medication Sig Dispense Refill  .  atorvastatin (LIPITOR) 20 MG tablet Take 1 tablet (20 mg total) by mouth at bedtime. 90 tablet 3  . Beclomethasone Dipropionate (QNASL) 80 MCG/ACT AERS 2 sprays each nostril daily 1 Inhaler 3  . cetirizine (ZYRTEC) 10 MG tablet Take 1 tablet (10 mg total) by mouth daily. 90 tablet 3  . Cholecalciferol (VITAMIN D3) 2000 UNITS capsule Take 1 capsule (2,000 Units total) by mouth daily. 90 capsule 3  . hydrochlorothiazide (HYDRODIURIL) 25 MG tablet Take 1 tablet (25 mg total) by mouth daily. 90 tablet 3  . LORazepam (ATIVAN) 1 MG tablet Take 1 tablet (1 mg total) by mouth 2 (two) times daily as needed for anxiety. 30 tablet 1  . phenytoin (DILANTIN) 100 MG ER capsule Take 2 capsules (200 mg total) by mouth every evening. 180 capsule 3  . solifenacin (VESICARE) 10 MG tablet Take 1 tablet (10 mg total) by mouth every morning. Takes half a tab daily. 90 tablet 3  . triamcinolone (NASACORT) 55 MCG/ACT AERO nasal inhaler Place 2 sprays into the nose daily. 1 Inhaler 12  . vitamin B-12 (CYANOCOBALAMIN) 100 MCG tablet Take 1 tablet (100 mcg total) by mouth daily. 90 tablet 3   No current facility-administered medications for this visit.    Family History  Problem Relation Age of Onset  . Cancer Mother     breast  . Hypertension  Mother   . Cancer Father     lung   . Diabetes Father   . Hypertension Father   . Cancer Sister     breast   . Hypertension Sister     ROS:  Pertinent items are noted in HPI.  Otherwise, a comprehensive ROS was negative.  Exam:   BP 140/80 mmHg  Pulse 64  Resp 18  Ht 5' 2"  (1.575 m)  Wt 188 lb (85.276 kg)  BMI 34.38 kg/m2  LMP 12/31/2004 (Approximate)    General appearance: alert, cooperative and appears stated age Head: Normocephalic, without obvious abnormality, atraumatic Neck: no adenopathy, supple, symmetrical, trachea midline and thyroid normal to inspection and palpation Lungs: clear to auscultation bilaterally Breasts: normal appearance, no masses or  tenderness, Inspection negative, No nipple retraction or dimpling, No nipple discharge or bleeding, No axillary or supraclavicular adenopathy Heart: regular rate and rhythm Abdomen: soft, non-tender; bowel sounds normal; no masses,  no organomegaly Extremities: extremities normal, atraumatic, no cyanosis or edema Skin: Skin color, texture, turgor normal. No rashes or lesions Lymph nodes: Cervical, supraclavicular, and axillary nodes normal. No abnormal inguinal nodes palpated Neurologic: Grossly normal  Pelvic: External genitalia:  no lesions              Urethra:  normal appearing urethra with no masses, tenderness or lesions              Bartholins and Skenes: normal                 Vagina: normal appearing vagina with normal color and discharge, no lesions              Cervix: no lesions              Pap taken: Yes.   Bimanual Exam:  Uterus:  uterus absent.  Bimanual exam limited by body habitus.              Adnexa: no mass, fullness, tenderness              Rectovaginal: Yes.  .  Confirms.              Anus:  normal sphincter tone, no lesions  Chaperone was present for exam.  Assessment:   Well woman visit with normal exam. Status post supracervical hyst.  Ovaries remain.  FH of premenopausal breast cancer.  Vaginal dryness.  Desire for STD testing.  Menopausal symptoms.   Plan: Yearly mammogram recommended after age 96.  Recommended self breast exam.  Pap and HR HPV as above. Discussed Calcium, Vitamin D, regular exercise program including cardiovascular and weight bearing exercise. Labs performed.  Yes.  .   See orders.  STD screening.  E2 and FSH.  Refills given on medications.  No..  See orders. Referral for genetic counseling and potential testing.   Follow up annually and prn.     After visit summary provided.

## 2016-01-04 NOTE — Patient Instructions (Addendum)
Use eye drops as prescribed Referral for colonoscopy Referral for Carpal tunnel Try the gabapentin at bedtime  F/U 6 months

## 2016-01-04 NOTE — Progress Notes (Signed)
Patient ID: Lisa [name], female   DOB: 12/20/1961, 55 y.o.   MRN: [id number]   Subjective:    Patient ID: Lisa [name], female    DOB: 1961/06/27, 55 y.o.   MRN: [id number]  Patient presents for Carpal Tunnel and [facility]  Patient is here to follow-up. Her carpal tunnel continues to worsen and is worse in the right hand but also has has in the left. She's had problems for a few years. She was treated for her trigger finger by orthopedics she had an injection and this helped for a little while however this is also returned. She notes that she will likely need surgery. She has been using wrist braces for her carpal tunnel but this is no longer helping. As she will be caring for her granddaughter she is unable to have surgery until February.  She's had some crusting and drainage from her left eye her son was recently here to the holidays and he had conjunctivitis. She uses some old eye drops that expired 2 years ago and it helped a little bit.  She is ready to have her colonoscopy.  She saw her GYN today labs are pending   Review Of Systems:  GEN- denies fatigue, fever, weight loss,weakness, recent illness HEENT- denies eye drainage, change in vision, nasal discharge, CVS- denies chest pain, palpitations RESP- denies SOB, cough, wheeze ABD- denies N/V, change in stools, abd pain GU- denies dysuria, hematuria, dribbling, incontinence MSK- + joint pain, muscle aches, injury Neuro- denies headache, dizziness, syncope, seizure activity       Objective:    BP 130/72 mmHg  Pulse 78  Temp(Src) 98.5 F (36.9 C) (Oral)  Resp 14  Ht 5\' 2"  (1.575 m)  Wt 189 lb (85.73 kg)  BMI 34.56 kg/m2  LMP 12/31/2004 (Approximate) GEN- NAD, alert and oriented x3 HEENT- PERRL, EOMI, non injected sclera, injected left conjunctiva, no draiange noted,  MMM, oropharynx clear Neck- Supple, no LAD CVS- RRR, no murmur RESP-CTAB MSK- good ROM wrist, +prayers, tinels bilat, sensation grossly in tact  Upper ext  Pulses- Radial 2+        Assessment & Plan:      Problem List Items Addressed This Visit    Essential hypertension    Blood pressure well controlled       Other Visit Diagnoses    Conjunctivitis of left eye    -  Primary    Polytrim GIVEN     Bilateral carpal tunnel syndrome        Plan for surgical intervention, will need updated nerve conduction studies as well. Start gabapentin at bedtime, continue bracing    Relevant Medications    gabapentin (NEURONTIN) 300 MG capsule    Other Relevant Orders    Ambulatory referral to Orthopedic Surgery    Colon cancer screening        Relevant Orders    Ambulatory referral to Gastroenterology       Note: This dictation was prepared with Dragon dictation along with smaller phrase technology. Any transcriptional errors that result from this process are unintentional.

## 2016-01-04 NOTE — Patient Instructions (Signed)
EXERCISE AND DIET:  We recommended that you start or continue a regular exercise program for good health. Regular exercise means any activity that makes your heart beat faster and makes you sweat.  We recommend exercising at least 30 minutes per day at least 3 days a week, preferably 4 or 5.  We also recommend a diet low in fat and sugar.  Inactivity, poor dietary choices and obesity can cause diabetes, heart attack, stroke, and kidney damage, among others.    ALCOHOL AND SMOKING:  Women should limit their alcohol intake to no more than 7 drinks/beers/glasses of wine (combined, not each!) per week. Moderation of alcohol intake to this level decreases your risk of breast cancer and liver damage. And of course, no recreational drugs are part of a healthy lifestyle.  And absolutely no smoking or even second hand smoke. Most people know smoking can cause heart and lung diseases, but did you know it also contributes to weakening of your bones? Aging of your skin?  Yellowing of your teeth and nails?  CALCIUM AND VITAMIN D:  Adequate intake of calcium and Vitamin D are recommended.  The recommendations for exact amounts of these supplements seem to change often, but generally speaking 600 mg of calcium (either carbonate or citrate) and 800 units of Vitamin D per day seems prudent. Certain women may benefit from higher intake of Vitamin D.  If you are among these women, your doctor will have told you during your visit.    PAP SMEARS:  Pap smears, to check for cervical cancer or precancers,  have traditionally been done yearly, although recent scientific advances have shown that most women can have pap smears less often.  However, every woman still should have a physical exam from her gynecologist every year. It will include a breast check, inspection of the vulva and vagina to check for abnormal growths or skin changes, a visual exam of the cervix, and then an exam to evaluate the size and shape of the uterus and  ovaries.  And after 53 years of age, a rectal exam is indicated to check for rectal cancers. We will also provide age appropriate advice regarding health maintenance, like when you should have certain vaccines, screening for sexually transmitted diseases, bone density testing, colonoscopy, mammograms, etc.   MAMMOGRAMS:  All women over 52 years old should have a yearly mammogram. Many facilities now offer a "3D" mammogram, which may cost around $50 extra out of pocket. If possible,  we recommend you accept the option to have the 3D mammogram performed.  It both reduces the number of women who will be called back for extra views which then turn out to be normal, and it is better than the routine mammogram at detecting truly abnormal areas.    COLONOSCOPY:  Colonoscopy to screen for colon cancer is recommended for all women at age 18.  We know, you hate the idea of the prep.  We agree, BUT, having colon cancer and not knowing it is worse!!  Colon cancer so often starts as a polyp that can be seen and removed at colonscopy, which can quite literally save your life!  And if your first colonoscopy is normal and you have no family history of colon cancer, most women don't have to have it again for 10 years.  Once every ten years, you can do something that may end up saving your life, right?  We will be happy to help you get it scheduled when you are ready.  Be sure to check your insurance coverage so you understand how much it will cost.  It may be covered as a preventative service at no cost, but you should check your particular policy.     BRCA-1 and BRCA-2 Testing BRCA-1 and BRCA-2 are genes that make proteins that help repair damaged cells. BRCA-1 and BRCA-2 testing is done to see if there is a mutation in either of these genes. If there is a mutation, the genes may not be able to help repair damaged cells. As a result, the cells may develop defects that can lead to certain types of cancer. You may have this  test if you have a family history of certain types of cancer, including cancer of the:  Breast.  Fallopian tubes.  Ovaries.  Peritoneum. The test requires either a sample of blood or a sample of the cells from the inside of your cheek. If a sample of blood is taken, it will be drawn from a vein in your arm using a thin needle. If a sample of cells is taken, you will get instructions on how to use a rinse to collect the sample. RESULTS It is your responsibility to obtain your test results. Ask the lab or the department doing the test when and how you will get the results. Contact your health care provider if you have any questions about your results. The lab test results can show whether:  You do not have a mutation in the BRCA-1 or BRCA-2 gene that increases your risk for certain cancers.  You have a mutation in the BRCA-1 or BRCA-2 gene that increases your risk for certain cancers.  You have a mutation in the BRCA-1 or BRCA-2 gene that has not been found to increase your risk for certain cancers. Meaning of Negative Test Results A negative test result means that you do not have a mutation in the BRCA-1 or BRCA-2 gene that is known to increase your risk for certain cancers. This does not mean you will never get cancer. Talk to your health care provider or genetic counselor about what this result means for you. Meaning of Positive Test Results A positive test result means that you do have a mutation in the BRCA-1 or BRCA-2 gene that increases your risk for certain cancers. Women with a positive test result have an increased risk for ovarian cancer. Both women and men with a mutation have an increased risk for breast cancer and may be at greater risk for other types of cancer. Getting a positive test result does not mean you will develop cancer.  You may be told you are a carrier. This means you can pass the mutation to your children.  Talk to your health care provider or genetic counselor  about what this result means for you. Meaning of Ambiguous Test Results Ambiguous, inconclusive, or uncertain test results mean there is a change in the BRCA-1 or BRCA-2 gene, but this change has not been linked to cancer. Talk to your health care provider or genetic counselor about what this result means for you.   This information is not intended to replace advice given to you by your health care provider. Make sure you discuss any questions you have with your health care provider.   Document Released: 01/10/2005 Document Revised: 01/07/2015 Document Reviewed: 03/18/2014 Elsevier Interactive Patient Education [employer].

## 2016-01-05 LAB — HEPATITIS C ANTIBODY: HCV AB: NEGATIVE

## 2016-01-05 LAB — STD PANEL
HEP B S AG: NEGATIVE
HIV 1&2 Ab, 4th Generation: NONREACTIVE

## 2016-01-06 LAB — IPS N GONORRHOEA AND CHLAMYDIA BY PCR

## 2016-01-09 ENCOUNTER — Telehealth: Payer: Self-pay

## 2016-01-09 LAB — IPS PAP TEST WITH HPV

## 2016-01-09 NOTE — Telephone Encounter (Signed)
Spoke with patient. Results given as seen below from [place]. Patient is agreeable and verbalizes understanding.  Routing to provider for final review. Patient agreeable to disposition. Will close encounter.

## 2016-01-09 NOTE — Telephone Encounter (Signed)
-----   Message from Nunzio Cobbs, MD sent at 01/07/2016  2:52 PM EST ----- Please inform patient of results:  Blood work indicates menopause. The testing for sexually transmitted disease is negative for HIV, syphilis, hepatitis B and C, and gonorrhea and chlamydia.  Pap is still pending.   Cc- Marisa Sprinkles

## 2016-01-11 ENCOUNTER — Encounter: Payer: BC Managed Care – PPO | Admitting: Obstetrics and Gynecology

## 2016-01-11 ENCOUNTER — Telehealth: Payer: Self-pay | Admitting: Family Medicine

## 2016-01-11 MED ORDER — CIPROFLOXACIN HCL 0.3 % OP SOLN: 2.0000 [drp] | Freq: Two times a day (BID) | OPHTHALMIC | Status: DC

## 2016-01-11 NOTE — Telephone Encounter (Signed)
Change to Cipro eye drop 2 drops BID x 7 days

## 2016-01-11 NOTE — Telephone Encounter (Signed)
Patient is calling to say that her eye is not better from last visit [phone]

## 2016-01-11 NOTE — Telephone Encounter (Signed)
Call placed to patient.   Reports that she continues to use ABTx eye gtts without relief.   States that L eye continues to be worse than R. Reports that L eye has burning and clear drainage throughout the day, but has thick crusting of white discharge in the morning.   Reports that she doe use her hands to wipe eyes and does report that she is not as diligent about hand hygiene as she should be when eyes are draining.    Advised to use clean tissue when eyes are draining and to wah hands or sanitize hands immediately after touching face.   MD please advise.

## 2016-01-11 NOTE — Telephone Encounter (Signed)
Call placed to patient and patient made aware.   Prescription sent to pharmacy.  

## 2016-01-23 ENCOUNTER — Telehealth: Payer: Self-pay | Admitting: Family Medicine

## 2016-01-23 ENCOUNTER — Telehealth: Payer: Self-pay | Admitting: *Deleted

## 2016-01-23 MED ORDER — GABAPENTIN 300 MG PO CAPS: 300.0000 mg | ORAL_CAPSULE | Freq: Every day | ORAL | Status: DC

## 2016-01-23 MED ORDER — HYDROCHLOROTHIAZIDE 25 MG PO TABS: 25.0000 mg | ORAL_TABLET | Freq: Every day | ORAL | Status: DC

## 2016-01-23 MED ORDER — VITAMIN D3 50 MCG (2000 UT) PO CAPS: 2000.0000 [IU] | ORAL_CAPSULE | Freq: Every day | ORAL | Status: DC

## 2016-01-23 MED ORDER — TRIAMCINOLONE ACETONIDE 55 MCG/ACT NA AERO: 2.0000 | INHALATION_SPRAY | Freq: Every day | NASAL | Status: DC

## 2016-01-23 MED ORDER — SCOPOLAMINE 1 MG/3DAYS TD PT72: 1.0000 | MEDICATED_PATCH | TRANSDERMAL | Status: DC

## 2016-01-23 MED ORDER — VITAMIN B-12 100 MCG PO TABS: 100.0000 ug | ORAL_TABLET | Freq: Every day | ORAL | Status: DC

## 2016-01-23 MED ORDER — ATORVASTATIN CALCIUM 20 MG PO TABS
20.0000 mg | ORAL_TABLET | Freq: Every day | ORAL | Status: DC
Start: 2016-01-23 — End: 2016-01-25

## 2016-01-23 MED ORDER — CETIRIZINE HCL 10 MG PO TABS: 10.0000 mg | ORAL_TABLET | Freq: Every day | ORAL | Status: DC

## 2016-01-23 NOTE — Telephone Encounter (Signed)
okay

## 2016-01-23 NOTE — Telephone Encounter (Signed)
Patient requesting printed prescription for Scopolamine patches for when she goes out of town.   Prescription printed and patient made aware to come to office to pick up on 01/24/2016.

## 2016-01-23 NOTE — Telephone Encounter (Signed)
Prescription printed and patient made aware to come to office to pick up on 01/24/2016. 

## 2016-01-23 NOTE — Telephone Encounter (Signed)
Pt needs written scripts for a 90 day supply of the following medications Hydroclorothiazize 25 mg, Atorvastatin 20 mg, Gabapentin 300 mg, Vitamin D-3 200, Zyrtec, Nasonex & Vitamin B-12 to take with her when she goes out of town this Friday.  [phone]

## 2016-01-23 NOTE — Telephone Encounter (Signed)
Ok to print prescriptions?

## 2016-01-25 ENCOUNTER — Other Ambulatory Visit: Payer: Self-pay | Admitting: *Deleted

## 2016-01-25 MED ORDER — SCOPOLAMINE 1 MG/3DAYS TD PT72: 1.0000 | MEDICATED_PATCH | TRANSDERMAL | Status: DC

## 2016-01-25 MED ORDER — VITAMIN B-12 100 MCG PO TABS: 100.0000 ug | ORAL_TABLET | Freq: Every day | ORAL | Status: DC

## 2016-01-25 MED ORDER — HYDROCHLOROTHIAZIDE 25 MG PO TABS: 25.0000 mg | ORAL_TABLET | Freq: Every day | ORAL | Status: DC

## 2016-01-25 MED ORDER — CETIRIZINE HCL 10 MG PO TABS: 10.0000 mg | ORAL_TABLET | Freq: Every day | ORAL | Status: DC

## 2016-01-25 MED ORDER — VITAMIN D3 50 MCG (2000 UT) PO CAPS: 2000.0000 [IU] | ORAL_CAPSULE | Freq: Every day | ORAL | Status: DC

## 2016-01-25 MED ORDER — GABAPENTIN 300 MG PO CAPS: 300.0000 mg | ORAL_CAPSULE | Freq: Every day | ORAL | Status: DC

## 2016-01-25 MED ORDER — TRIAMCINOLONE ACETONIDE 55 MCG/ACT NA AERO: 2.0000 | INHALATION_SPRAY | Freq: Every day | NASAL | Status: DC

## 2016-01-25 MED ORDER — ATORVASTATIN CALCIUM 20 MG PO TABS: 20.0000 mg | ORAL_TABLET | Freq: Every day | ORAL | Status: DC

## 2016-01-25 NOTE — Telephone Encounter (Signed)
Pt came in to get her printed scripts and stated her HCTZ was not printed and needing this printed as well, i printed the script and waiting on provider to sign

## 2016-01-25 NOTE — Addendum Note (Signed)
Addended by: Sheral Flow on: 01/25/2016 01:11 PM   Modules accepted: Orders

## 2016-01-25 NOTE — Addendum Note (Signed)
Addended by: Sheral Flow on: 01/25/2016 01:03 PM   Modules accepted: Orders

## 2016-01-25 NOTE — Addendum Note (Signed)
Addended by: Sheral Flow on: 01/25/2016 01:08 PM   Modules accepted: Orders

## 2016-01-25 NOTE — Telephone Encounter (Signed)
Patient in office to pick up prescriptions.   States that she will be using [facility] and requested that all medications have available refills.   Prescriptions re-printed for MD signature.   Patient can pick up on 01/26/2016.

## 2016-01-25 NOTE — Telephone Encounter (Signed)
okay

## 2016-02-03 ENCOUNTER — Ambulatory Visit: Payer: BLUE CROSS/BLUE SHIELD | Admitting: Family Medicine

## 2016-02-10 ENCOUNTER — Ambulatory Visit: Payer: BC Managed Care – PPO | Admitting: Family Medicine

## 2016-02-15 ENCOUNTER — Ambulatory Visit: Payer: BLUE CROSS/BLUE SHIELD | Admitting: Dietician

## 2016-03-06 ENCOUNTER — Encounter: Payer: BLUE CROSS/BLUE SHIELD | Admitting: Genetic Counselor

## 2016-03-06 ENCOUNTER — Other Ambulatory Visit: Payer: BLUE CROSS/BLUE SHIELD

## 2016-03-07 ENCOUNTER — Encounter: Payer: BLUE CROSS/BLUE SHIELD | Admitting: Genetic Counselor

## 2016-03-07 ENCOUNTER — Other Ambulatory Visit: Payer: BLUE CROSS/BLUE SHIELD

## 2016-03-07 ENCOUNTER — Telehealth: Payer: Self-pay | Admitting: Genetic Counselor

## 2016-03-07 ENCOUNTER — Encounter: Payer: Self-pay | Admitting: Genetic Counselor

## 2016-03-07 NOTE — Telephone Encounter (Signed)
Pt called in to reschedule her appt for genetics and a reminder letter was sent out

## 2016-03-09 LAB — HM COLONOSCOPY

## 2016-03-13 ENCOUNTER — Encounter: Payer: Self-pay | Admitting: Family Medicine

## 2016-03-13 ENCOUNTER — Ambulatory Visit (INDEPENDENT_AMBULATORY_CARE_PROVIDER_SITE_OTHER): Payer: BC Managed Care – PPO | Admitting: Family Medicine

## 2016-03-13 VITALS — BP 128/70 | HR 70 | Temp 98.3°F | Resp 14 | Ht 62.0 in | Wt 194.0 lb

## 2016-03-13 DIAGNOSIS — R062 Wheezing: Secondary | ICD-10-CM | POA: Diagnosis not present

## 2016-03-13 DIAGNOSIS — J309 Allergic rhinitis, unspecified: Secondary | ICD-10-CM | POA: Diagnosis not present

## 2016-03-13 MED ORDER — FLUTICASONE PROPIONATE 50 MCG/ACT NA SUSP: 2.0000 | Freq: Every day | NASAL | Status: DC

## 2016-03-13 NOTE — Progress Notes (Signed)
Patient ID: Lisa [name], female   DOB: [birth year], 55 y.o.   MRN: [id number]   Subjective:    Patient ID: Lisa [name], female    DOB: February 04, 1961, 55 y.o.   MRN: [id number]  Patient presents for 3 month F/U  Patient here for follow-up. She was actually a low early on her regular visit. At any rate she states that she continues to have these random episodes where she feels like she is wheezing or gets tight in the chest and sometimes it occurs with exercise others when she is at rest. She is just worried about possible cancer. Her father had lung cancer and her husband passed away with a cancer that was found more at his advanced age and everything seems to make her feel like a hypochondriac. She does have previous smoking history.  She did have her colonoscopy done last Friday and was told that everything looked good.  Allergies she's been having a lot of drainage no significant sinus pressure. Also watery eyes. She is using her Zyrtec will like to switch over to Flonase once she runs out of the [place] because it is not covered on her insurance.   Review Of Systems:  GEN- denies fatigue, fever, weight loss,weakness, recent illness HEENT- denies eye drainage, change in vision, +nasal discharge, CVS- denies chest pain, palpitations RESP- denies SOB, cough, wheeze ABD- denies N/V, change in stools, abd pain GU- denies dysuria, hematuria, dribbling, incontinence MSK- denies joint pain, muscle aches, injury Neuro- denies headache, dizziness, syncope, seizure activity       Objective:    BP 128/70 mmHg  Pulse 70  Temp(Src) 98.3 F (36.8 C) (Oral)  Resp 14  Ht 5\' 2"  (1.575 m)  Wt 194 lb (87.998 kg)  BMI 35.47 kg/m2  LMP 12/31/2004 (Approximate) GEN- NAD, alert and oriented x3 HEENT- PERRL, EOMI, non injected sclera, pink conjunctiva, MMM, oropharynx clear, nares- clear rhinorrhea, enlarged turbinates, TM clear, canals clear  Neck- Supple, no LAD  CVS- RRR, no  murmur RESP-CTAB Pulses- Radial,- 2+        Assessment & Plan:      Problem List Items Addressed This Visit    Allergic rhinitis    Continue zyrtec and nasal steroid       Other Visit Diagnoses    Wheezing    -  Primary    Normal exam, though previous smoker, symptoms do not affect activities, obtain CXR, will then obtain PFT     Relevant Orders    DG Chest 2 View       Note: This dictation was prepared with Dragon dictation along with smaller phrase technology. Any transcriptional errors that result from this process are unintentional.

## 2016-03-13 NOTE — Patient Instructions (Addendum)
Get the chest xray- [place] for allergy  Next step Pulmonary Function test to be done Change F/U to August

## 2016-03-13 NOTE — Assessment & Plan Note (Signed)
Continue zyrtec and nasal steroid

## 2016-03-21 ENCOUNTER — Other Ambulatory Visit: Payer: Self-pay | Admitting: *Deleted

## 2016-03-21 MED ORDER — FLUTICASONE PROPIONATE 50 MCG/ACT NA SUSP: 2.0000 | Freq: Every day | NASAL | Status: DC

## 2016-03-21 NOTE — Telephone Encounter (Signed)
Patient in office and requested printed prescription for Flonase.   Prescription printed for 90 day supply and given to patient.

## 2016-03-29 ENCOUNTER — Ambulatory Visit: Payer: BLUE CROSS/BLUE SHIELD | Admitting: Dietician

## 2016-04-04 ENCOUNTER — Encounter: Payer: BLUE CROSS/BLUE SHIELD | Admitting: Genetic Counselor

## 2016-04-04 ENCOUNTER — Other Ambulatory Visit: Payer: BLUE CROSS/BLUE SHIELD

## 2016-04-11 ENCOUNTER — Ambulatory Visit: Payer: BLUE CROSS/BLUE SHIELD | Admitting: Dietician

## 2016-05-01 ENCOUNTER — Ambulatory Visit
Admission: RE | Admit: 2016-05-01 | Discharge: 2016-05-01 | Disposition: A | Discharge status: Home / Self Care | Payer: BLUE CROSS/BLUE SHIELD | Source: Ambulatory Visit | Attending: Family Medicine | Admitting: Family Medicine

## 2016-05-01 ENCOUNTER — Telehealth: Payer: Self-pay | Admitting: Family Medicine

## 2016-05-01 DIAGNOSIS — R062 Wheezing: Secondary | ICD-10-CM

## 2016-05-01 NOTE — Telephone Encounter (Signed)
Pt walked in to day to get CXR ordered in March and Lumbar spine films ordered in Feb 2016!  They were able to do CXR but lumbar spine order has expired.  Do you feel it need to be renewed?

## 2016-05-01 NOTE — Telephone Encounter (Signed)
CXR is normal.  Xray of spine not needed

## 2016-05-07 NOTE — Telephone Encounter (Signed)
Pt called and told Lumbar film order over 53 year old and provider does not feel need to be reordered.  Told CXR normal

## 2016-05-09 ENCOUNTER — Ambulatory Visit: Payer: BLUE CROSS/BLUE SHIELD | Admitting: Dietician

## 2016-05-14 ENCOUNTER — Encounter: Payer: BLUE CROSS/BLUE SHIELD | Admitting: Genetic Counselor

## 2016-05-14 ENCOUNTER — Other Ambulatory Visit: Payer: BLUE CROSS/BLUE SHIELD

## 2016-06-04 ENCOUNTER — Ambulatory Visit: Payer: BC Managed Care – PPO | Admitting: Family Medicine

## 2016-06-13 ENCOUNTER — Other Ambulatory Visit: Payer: BLUE CROSS/BLUE SHIELD

## 2016-06-13 ENCOUNTER — Encounter: Payer: BLUE CROSS/BLUE SHIELD | Admitting: Genetic Counselor

## 2016-06-25 ENCOUNTER — Ambulatory Visit (INDEPENDENT_AMBULATORY_CARE_PROVIDER_SITE_OTHER): Payer: BC Managed Care – PPO | Admitting: Nurse Practitioner

## 2016-06-25 ENCOUNTER — Encounter: Payer: Self-pay | Admitting: Nurse Practitioner

## 2016-06-25 VITALS — BP 120/76 | HR 64 | Ht 62.0 in | Wt 190.0 lb

## 2016-06-25 DIAGNOSIS — N760 Acute vaginitis: Secondary | ICD-10-CM | POA: Diagnosis not present

## 2016-06-25 NOTE — Progress Notes (Deleted)
Patient ID: Lisa [name], female   DOB: [birth year], 55 y.o.   MRN: [id number] African American55 y.o. Married [place] American female [phone] here with complaint of vaginal symptoms of itching, burning, and increase discharge. Describes discharge as ***. Onset of symptoms *** days ago. Denies new personal products or vaginal dryness. *** STD concerns. Urinary symptoms *** . Contraception is ***.   O:  Healthy female WDWN Affect: normal, orientation x 3  Exam: Abdomen: Lymph node: no enlargement or tenderness Pelvic exam: External genital: normal female BUS: negative Vagina: *** discharge noted.  Affirm taken. Cervix: normal, non tender, no CMT Uterus: normal, non tender Adnexa:normal, non tender, no masses or fullness noted    A: Vaginitis   P: Discussed findings of vaginitis and etiology. Discussed Aveeno or baking soda sitz bath for comfort. Avoid moist clothes or pads for extended period of time. If working out in gym clothes or swim suits for long periods of time change underwear or bottoms of swimsuit if possible. Olive Oil/Coconut Oil use for skin protection prior to activity can be used to external skin.  Rx: ***  Follow with Affirm  Rv prn

## 2016-06-25 NOTE — Patient Instructions (Addendum)
OTC lubrication: Creme' de femme' Pharmaceutical grade coconut oil Replens  Aveeno bath salts.    Atrophic Vaginitis Atrophic vaginitis is a condition in which the tissues that line the vagina become dry and thin. This condition is most common in women who have stopped having regular menstrual periods (menopause). This usually starts when a woman is 43-53 years old. Estrogen helps to keep the vagina moist. It stimulates the vagina to produce a clear fluid that lubricates the vagina for sexual intercourse. This fluid also protects the vagina from infection. Lack of estrogen can cause the lining of the vagina to get thinner and dryer. The vagina may also shrink in size. It may become less elastic. Atrophic vaginitis tends to get worse over time as a woman's estrogen level drops. CAUSES This condition is caused by the normal drop in estrogen that happens around the time of menopause. RISK FACTORS Certain conditions or situations may lower a woman's estrogen level, which increases her risk of atrophic vaginitis. These include:  Taking medicine that blocks estrogen.  Having ovaries removed surgically.  Being treated for cancer with X-ray treatment (radiation) or medicines (chemotherapy).  Exercising very hard and often.  Having an eating disorder (anorexia).  Giving birth or breastfeeding.  Being over the age of 35.  Smoking. SYMPTOMS Symptoms of this condition include:  Pain, soreness, or bleeding during sexual intercourse (dyspareunia).  Vaginal burning, irritation, or itching.  Pain or bleeding during a vaginal examination using a speculum (pelvic exam).  Loss of interest in sexual activity.  Having burning pain when passing urine.  Vaginal discharge that is brown or yellow. In some cases, there are no symptoms. DIAGNOSIS This condition is diagnosed with a medical history and physical exam. This will include a pelvic exam that checks whether the inside of your vagina  appears pale, thin, or dry. Rarely, you may also have other tests, including:  A urine test.  A test that checks the acid balance in your vaginal fluid (acid balance test). TREATMENT Treatment for this condition may depend on the severity of your symptoms. Treatment may include:  Using an over-the-counter vaginal lubricant before you have sexual intercourse.  Using a long-acting vaginal moisturizer.  Using low-dose vaginal estrogen for moderate to severe symptoms that do not respond to other treatments. Options include creams, tablets, and inserts (vaginal rings). Before using vaginal estrogen, tell your health care provider if you have a history of:  Breast cancer.  Endometrial cancer.  Blood clots.  Taking medicines. You may be able to take a daily pill for dyspareunia. Discuss all of the risks of this medicine with your health care provider. It is usually not recommended for women who have a family history or personal history of breast cancer. If your symptoms are very mild and you are not sexually active, you may not need treatment. HOME CARE INSTRUCTIONS  Take medicines only as directed by your health care provider. Do not use herbal or alternative medicines unless your health care provider says that you can.  Use over-the-counter creams, lubricants, or moisturizers for dryness only as directed by your health care provider.  If your atrophic vaginitis is caused by menopause, discuss all of your menopausal symptoms and treatment options with your health care provider.  Do not douche.  Do not use products that can make your vagina dry. These include:  Scented feminine sprays.  Scented tampons.  Scented soaps.  If it hurts to have sex, talk with your sexual partner. SEEK MEDICAL CARE IF:  Your discharge looks different than normal.  Your vagina has an unusual smell.  You have new symptoms.  Your symptoms do not improve with treatment.  Your symptoms get worse.    This information is not intended to replace advice given to you by your health care provider. Make sure you discuss any questions you have with your health care provider.   Document Released: 05/03/2015 Document Reviewed: 05/03/2015 Elsevier Interactive Patient Education [employer].

## 2016-06-25 NOTE — Progress Notes (Signed)
55 y.o. Widowed [place] American female [phone] here with complaint of vaginal symptoms of itching or tingling at the introitus.Marland Kitchen Describes discharge as none.  Symptoms seemed to start after being with her partner from June 8-12.  He lives in [place] and they see each other about every 3 months.   Onset of symptoms 6/14 days ago. Denies new personal products or vaginal dryness. No STD concerns- same partner for 1.5 yrs.  Urinary symptoms none. Contraception is post hysterectomy 2005. She was seen for AEX in January and had all STD's done at that time which was negative.     O:  Healthy female WDWN Affect: normal, orientation x 3  Exam: no distress Abdomen: soft and non tender Lymph node: no enlargement or tenderness Pelvic exam: External genital: normal female without lesions BUS: negative Vagina: only clear thin discharge noted.  Affirm taken. Atrophic changes noted Cervix: normal, non tender, no CMT Uterus: absent    A: R/O Vaginitis  Atrophic vaginal changes   P: Discussed findings of vaginitis and etiology. Discussed Aveeno or baking soda sitz bath for comfort. Avoid moist clothes or pads for extended period of time. If working out in gym clothes or swim suits for long periods of time change underwear or bottoms of swimsuit if possible. Olive Oil/Coconut Oil use for skin protection prior to activity can be used to external skin.  Rx: await labs  Follow with Affirm  recommendations for OTC lubrication  RV prn   While here did discuss [facility] of breast cancer and BRCA testing;  states she has not made time to get this done.  Reviewed her Mammogram and recommendations for MRI.  States she was not aware of this and at next Mammogram will make sure that Dr. Quincy Simmonds gets a copy.

## 2016-06-26 ENCOUNTER — Other Ambulatory Visit: Payer: Self-pay | Admitting: Nurse Practitioner

## 2016-06-26 LAB — WET PREP BY MOLECULAR PROBE
Candida species: NEGATIVE
GARDNERELLA VAGINALIS: POSITIVE — AB
TRICHOMONAS VAG: NEGATIVE

## 2016-06-26 MED ORDER — METRONIDAZOLE 0.75 % VA GEL
1.0000 | Freq: Every day | VAGINAL | Status: DC
Start: 2016-06-26 — End: 2016-08-01

## 2016-06-26 MED ORDER — FLUCONAZOLE 150 MG PO TABS: 150.0000 mg | ORAL_TABLET | Freq: Once | ORAL | Status: DC

## 2016-06-27 NOTE — Progress Notes (Signed)
Encounter reviewed by Dr. Brook Amundson C. Silva.  

## 2016-08-01 ENCOUNTER — Ambulatory Visit: Payer: BC Managed Care – PPO | Admitting: Nurse Practitioner

## 2016-08-01 ENCOUNTER — Encounter: Payer: Self-pay | Admitting: Nurse Practitioner

## 2016-08-01 ENCOUNTER — Ambulatory Visit (INDEPENDENT_AMBULATORY_CARE_PROVIDER_SITE_OTHER): Payer: BC Managed Care – PPO | Admitting: Nurse Practitioner

## 2016-08-01 VITALS — BP 122/70 | HR 68 | Temp 98.6°F | Resp 16 | Ht 62.0 in | Wt 189.0 lb

## 2016-08-01 DIAGNOSIS — R319 Hematuria, unspecified: Secondary | ICD-10-CM

## 2016-08-01 DIAGNOSIS — R3915 Urgency of urination: Secondary | ICD-10-CM | POA: Diagnosis not present

## 2016-08-01 DIAGNOSIS — N760 Acute vaginitis: Secondary | ICD-10-CM | POA: Diagnosis not present

## 2016-08-01 LAB — POCT URINALYSIS DIPSTICK
Bilirubin, UA: NEGATIVE
Glucose, UA: NEGATIVE
KETONES UA: NEGATIVE
LEUKOCYTES UA: NEGATIVE
Nitrite, UA: NEGATIVE
PH UA: 5
PROTEIN UA: NEGATIVE
UROBILINOGEN UA: NEGATIVE

## 2016-08-01 NOTE — Progress Notes (Signed)
55 y.o. Widowed [place] American female [phone] here with complaint of vaginal symptoms of itching, burning, no discharge. Describes discharge as none. Onset of symptoms 3 days ago. Denies new personal products or vaginal dryness. No STD concerns. Urinary symptoms bladder incontinence . Contraception is hysterectomy. Partner in  [place] and she sees him about every 3 months.  These symptoms occurred again after SA.   O:  Healthy female WDWN Affect: normal, orientation x 3  Exam: no distress Abdomen: soft and non tender Lymph node: no enlargement or tenderness Pelvic exam: External genital: normal female BUS: negative Vagina: thin white discharge noted.  Affirm taken. Adnexa:normal, non tender, no masses or fullness noted    A: R/O Vaginitis  R/O STD's  R/O UTI   P: Discussed findings of vaginitis and etiology. Discussed Aveeno or baking soda sitz bath for comfort. Avoid moist clothes or pads for extended period of time. If working out in gym clothes or swim suits for long periods of time change underwear or bottoms of swimsuit if possible. Olive Oil/Coconut Oil use for skin protection prior to activity can be used to external skin.  Rx: will await test results  Follow with Affirm, GC & Chl, and urine  RV prn

## 2016-08-01 NOTE — Patient Instructions (Signed)

## 2016-08-02 ENCOUNTER — Other Ambulatory Visit: Payer: Self-pay | Admitting: Certified Nurse Midwife

## 2016-08-02 ENCOUNTER — Telehealth: Payer: Self-pay | Admitting: *Deleted

## 2016-08-02 DIAGNOSIS — B9689 Other specified bacterial agents as the cause of diseases classified elsewhere: Secondary | ICD-10-CM

## 2016-08-02 DIAGNOSIS — N760 Acute vaginitis: Principal | ICD-10-CM

## 2016-08-02 LAB — URINALYSIS, MICROSCOPIC ONLY
Bacteria, UA: NONE SEEN [HPF]
Casts: NONE SEEN [LPF]
Crystals: NONE SEEN [HPF]
WBC UA: NONE SEEN WBC/HPF (ref <=5)
YEAST: NONE SEEN [HPF]

## 2016-08-02 LAB — WET PREP BY MOLECULAR PROBE
Candida species: NEGATIVE
GARDNERELLA VAGINALIS: POSITIVE — AB
TRICHOMONAS VAG: NEGATIVE

## 2016-08-02 MED ORDER — METRONIDAZOLE 0.75 % VA GEL: 1.0000 | Freq: Two times a day (BID) | VAGINAL | 0 refills | Status: DC

## 2016-08-02 NOTE — Telephone Encounter (Signed)
Discussed results with patient. Patient wanted to know if she we were going to schedule MRI of breast as it was discussed at office visit. Please advise

## 2016-08-02 NOTE — Telephone Encounter (Signed)
Patient calling about results and MMG.

## 2016-08-03 LAB — URINE CULTURE: Organism ID, Bacteria: NO GROWTH

## 2016-08-03 LAB — IPS N GONORRHOEA AND CHLAMYDIA BY PCR

## 2016-08-03 NOTE — Progress Notes (Signed)
Encounter reviewed Lisa Gladwin, MD   

## 2016-08-03 NOTE — Telephone Encounter (Signed)
She saw Dr. Quincy Simmonds for AEX 01/2016 and most likely this was discussed then.  Pt and I had no conversation about this.  But her report from the [facility] Center does make a recommendation about Breast MRI.  She will pre certification for this.

## 2016-08-06 ENCOUNTER — Other Ambulatory Visit: Payer: Self-pay | Admitting: Nurse Practitioner

## 2016-08-06 MED ORDER — FLUCONAZOLE 150 MG PO TABS: 150.0000 mg | ORAL_TABLET | Freq: Once | ORAL | 0 refills | Status: AC

## 2016-08-07 ENCOUNTER — Other Ambulatory Visit: Payer: Self-pay | Admitting: Family Medicine

## 2016-08-07 NOTE — Telephone Encounter (Signed)
Refill appropriate and filled per protocol. 

## 2016-08-09 NOTE — Telephone Encounter (Signed)
I will follow up with Edman Circle, FNP on 8/11, as she is out of the office today, for clarification of her last note.

## 2016-08-09 NOTE — Telephone Encounter (Signed)
Has this been taken care of? Can I close encounter?  Routed to [employer].

## 2016-08-14 ENCOUNTER — Ambulatory Visit: Payer: BC Managed Care – PPO | Admitting: Family Medicine

## 2016-08-22 NOTE — Telephone Encounter (Signed)
Dr. Quincy Simmonds you saw this pt for AEX 01/04/2016.  Her [facility] was mother and sister with breast cancer.  Mammo 09/14/15 says Breast MRI is recommended.  Now calls with questions about getting this done.  Is this OK with you and I will send note to [facility] to get pre authorization.  She must have her Mammo done after 09/13/16 and within 3 months to get MRI.  Pt will be called and given this information.

## 2016-08-23 ENCOUNTER — Telehealth: Payer: Self-pay | Admitting: Obstetrics and Gynecology

## 2016-08-23 ENCOUNTER — Other Ambulatory Visit: Payer: Self-pay | Admitting: Obstetrics and Gynecology

## 2016-08-23 DIAGNOSIS — Z9189 Other specified personal risk factors, not elsewhere classified: Secondary | ICD-10-CM

## 2016-08-23 NOTE — Telephone Encounter (Signed)
Triage,  Please contact patient in follow up to the following.  Ok for Breast MRI due to increased lifetime risk of breast cancer.  I will place an order. I referred patient for genetic counseling and testing due to her strong family history of breast cancer.  I do not see this consultation in EPIC, so I do not know if this was already done.  I think that this may be very helpful for her, but it is her choice.  Lisa [name]

## 2016-08-23 NOTE — Telephone Encounter (Signed)
Lisa [name] from [facility] called requesting an order change from MRI of the breast bilateral with contrast to MRI bilateral breast with and without contrast.

## 2016-08-23 NOTE — Telephone Encounter (Signed)
Spoke with patient. Advised of message as seen below from [place]. She is agreeable and verbalizes understanding. Patient would like to proceed with breast MRI following screening mammogram. Reports she is going to be out of town when her mammogram is due in September. She is going to contact the Breast Center to schedule an appointment for October when she returns. Aware MRI will be precerted and scheduled after mammogram is performed. Also reports she has been scheduled with the cancer center for genetics counseling, but has had to cancel her appointments due to travel. Offered to help get this appointment schedule. Patient states she will call to reschedule this appointment for a day and time that will work well for her. Reminder placed in [facility] for MRI scheduling in October after MMG. Patient also placed in imaging hold as a reminder.  Cc: Kem Boroughs, FNP   Routing to provider for final review. Patient agreeable to disposition. Will close encounter.

## 2016-08-24 ENCOUNTER — Other Ambulatory Visit: Payer: Self-pay | Admitting: Obstetrics and Gynecology

## 2016-08-24 DIAGNOSIS — Z1231 Encounter for screening mammogram for malignant neoplasm of breast: Secondary | ICD-10-CM

## 2016-08-24 NOTE — Telephone Encounter (Signed)
New order placed for bilateral breast MRI w wo contrast due to patient's increased risk for breast cancer. Patient is aware of need for MRI (see telephone encounter dated 08/02/2016). [facility] has been notified of order change. Amber at [facility] will call to schedule patient's routine mammogram and breast MRI at this time.  Routing to provider for final review. Patient agreeable to disposition. Will close encounter.

## 2016-08-29 ENCOUNTER — Telehealth: Payer: Self-pay | Admitting: Nurse Practitioner

## 2016-08-29 NOTE — Telephone Encounter (Signed)
Patient requesting her order a breast MRI be sent to any of the following imaging centers that are in-network with her insurance: [facility], [facility] or [facility]. [facility] is not in network with her insurance.

## 2016-09-24 NOTE — Telephone Encounter (Signed)
Call to patient to discuss referral for mammogram and breast mri Patient is scheduled at [facility] for her mammogram on 10/4 and her mri on 10/9. Her message states she would prefer a referral at other locations due to insurance.  Spoke with [facility] and scheduled mammogram 10/26/16 (next available). They will call patient regarding MRI.   Left voicemail for patient to return our call.

## 2016-09-25 NOTE — Telephone Encounter (Signed)
Patient is returning a call to [place]. Patient asked that you call after 10:00am.

## 2016-09-28 ENCOUNTER — Telehealth: Payer: Self-pay | Admitting: Obstetrics and Gynecology

## 2016-09-28 NOTE — Telephone Encounter (Signed)
Patient scheduled 10/03/16 for her mammogram at [facility] and 10/08/16 for MRI. Patient requested appointment at [facility] or other facility for insurance purposes. Call to [facility] and patient is scheduled 10/26/16 @ 5pm for Mammogram. MRI not scheduled. Representative states there is a Health and safety inspector regarding patients with family history - if she qualifies MRI is no cost. If not they will schedule with her. Call to patient to discuss her preference. Left voicemail.   Facility information: [facility]  [phone].  Pending return call from patient to discuss.

## 2016-10-03 ENCOUNTER — Ambulatory Visit: Payer: BLUE CROSS/BLUE SHIELD

## 2016-10-08 ENCOUNTER — Other Ambulatory Visit: Payer: BLUE CROSS/BLUE SHIELD

## 2016-10-09 NOTE — Telephone Encounter (Signed)
Okay to close encounter or is further follow up needed? °

## 2016-10-09 NOTE — Telephone Encounter (Signed)
Routing to Theresia Lo for review before closing as she was facilitating this appointment for the patient.

## 2016-10-12 ENCOUNTER — Telehealth: Payer: Self-pay | Admitting: Nurse Practitioner

## 2016-10-12 NOTE — Telephone Encounter (Signed)
Patient states that her MRI needs prior approval.  Patient is requesting a call back when the Authorization has been received.

## 2016-10-16 NOTE — Telephone Encounter (Signed)
Spoke with patient regarding MRI approval. She states her insurance requires specific facilities, [facility] is not in network with her plan.  Patient to call back with the name of the facility where she request for imaging. Advised we received that information, we will move forward in obtaining the authorization.

## 2016-11-19 ENCOUNTER — Ambulatory Visit: Payer: BLUE CROSS/BLUE SHIELD

## 2016-11-19 ENCOUNTER — Ambulatory Visit: Payer: BC Managed Care – PPO | Admitting: Family Medicine

## 2016-11-20 ENCOUNTER — Ambulatory Visit
Admission: RE | Admit: 2016-11-20 | Discharge: 2016-11-20 | Disposition: A | Discharge status: Home / Self Care | Payer: BLUE CROSS/BLUE SHIELD | Source: Ambulatory Visit | Attending: Obstetrics and Gynecology | Admitting: Obstetrics and Gynecology

## 2016-11-20 ENCOUNTER — Encounter: Payer: Self-pay | Admitting: Family Medicine

## 2016-11-20 ENCOUNTER — Ambulatory Visit (INDEPENDENT_AMBULATORY_CARE_PROVIDER_SITE_OTHER): Payer: BC Managed Care – PPO | Admitting: Family Medicine

## 2016-11-20 VITALS — BP 128/74 | HR 86 | Temp 98.3°F | Resp 14 | Ht 62.0 in | Wt 190.0 lb

## 2016-11-20 DIAGNOSIS — Z23 Encounter for immunization: Secondary | ICD-10-CM

## 2016-11-20 DIAGNOSIS — H1032 Unspecified acute conjunctivitis, left eye: Secondary | ICD-10-CM | POA: Diagnosis not present

## 2016-11-20 DIAGNOSIS — I10 Essential (primary) hypertension: Secondary | ICD-10-CM | POA: Diagnosis not present

## 2016-11-20 DIAGNOSIS — Z1231 Encounter for screening mammogram for malignant neoplasm of breast: Secondary | ICD-10-CM

## 2016-11-20 DIAGNOSIS — D229 Melanocytic nevi, unspecified: Secondary | ICD-10-CM | POA: Diagnosis not present

## 2016-11-20 MED ORDER — ATORVASTATIN CALCIUM 20 MG PO TABS: 20.0000 mg | ORAL_TABLET | Freq: Every day | ORAL | 3 refills | Status: DC

## 2016-11-20 MED ORDER — PHENYTOIN SODIUM EXTENDED 100 MG PO CAPS: 200.0000 mg | ORAL_CAPSULE | Freq: Every evening | ORAL | 3 refills | Status: DC

## 2016-11-20 MED ORDER — FLUTICASONE PROPIONATE 50 MCG/ACT NA SUSP
2.0000 | Freq: Every day | NASAL | 3 refills | Status: DC
Start: 2016-11-20 — End: 2017-07-05

## 2016-11-20 MED ORDER — CIPROFLOXACIN HCL 0.3 % OP SOLN: OPHTHALMIC | 0 refills | Status: DC

## 2016-11-20 MED ORDER — LIDOCAINE 5 % EX PTCH: 1.0000 | MEDICATED_PATCH | CUTANEOUS | 3 refills | Status: DC

## 2016-11-20 MED ORDER — CETIRIZINE HCL 10 MG PO TABS: 10.0000 mg | ORAL_TABLET | Freq: Every day | ORAL | 3 refills | Status: DC

## 2016-11-20 MED ORDER — HYDROCHLOROTHIAZIDE 25 MG PO TABS: 25.0000 mg | ORAL_TABLET | Freq: Every day | ORAL | 3 refills | Status: DC

## 2016-11-20 NOTE — Assessment & Plan Note (Signed)
Well-controlled no change to hydrochlorothiazide she will need labs at her next visit.

## 2016-11-20 NOTE — Progress Notes (Signed)
   Subjective:    Patient ID: Lisa [name], female    DOB: [birth year], 55 y.o.   MRN: [id number]  Patient presents for Mole (has mole to outer R thigh- states that area has changed recently and feels scaly) and L Eye Drainage (clear drainage, some crusts in AM)  Dr. Jorja Loa Is her eye doctor she has contacts, she is have her current draining of her left eye it typically starts draining at time of changing of her contact every 30 days,  Only left side. Her eye feels gritty and red. She's not had any change in actual contact lens or her solution    Mole- right leg, has been there for a while seems harder raised now hard like a scab on center, has been present for a few years now but seems to be changing.  Needs refill on her medications  Flu shot        Review Of Systems:  GEN- denies fatigue, fever, weight loss,weakness, recent illness HEENT-+ eye drainage, change in vision, nasal discharge, CVS- denies chest pain, palpitations RESP- denies SOB, cough, wheeze ABD- denies N/V, change in stools, abd pain GU- denies dysuria, hematuria, dribbling, incontinence MSK- denies joint pain, muscle aches, injury Neuro- denies headache, dizziness, syncope, seizure activity       Objective:    BP 128/74 (BP Location: Left Arm, Patient Position: Sitting, Cuff Size: Large)   Pulse 86   Temp 98.3 F (36.8 C) (Oral)   Resp 14   Ht 5\' 2"  (1.575 m)   Wt 190 lb (86.2 kg)   LMP 12/31/2004 (Approximate)   SpO2 98%   BMI 34.75 kg/m  GEN- NAD, alert and oriented x3 HEENT- PERRL, EOMI, non injected sclera,injected left conjunctiva, right pink conjunctiva , MMM, oropharynx clear Neck- Supple, no thyromegaly CVS- RRR, no murmur RESP-CTAB Skin- right lateral thigh < 0.5cm  Hyperpigmented raised mole with scaliness in center, lighter rim at border  Procedure- Shave biopsy Mole  Procedure explained to patient questions answered benefits and risks discussed verbal consent  obtained. Antiseptic- betadine  Anesthesia-lidocaine 1% with Epi Razor blade used to shave the mole off in entirety. Drysol solution applied with good hemostasis. Patient tolerated procedure well Bandage applied           Assessment & Plan:      Problem List Items Addressed This Visit    Essential hypertension    Well-controlled no change to hydrochlorothiazide she will need labs at her next visit.      Relevant Medications   hydrochlorothiazide (HYDRODIURIL) 25 MG tablet   atorvastatin (LIPITOR) 20 MG tablet    Other Visit Diagnoses    Acute bacterial conjunctivitis of left eye    -  Primary   Conjunctivitis noted she wears contacts recommend that she take these out and see her eye doctor. I've given her Cipro drops because of the holiday   Atypical mole       Status post shave biopsy tolerated procedure well   Relevant Orders   Pathology Report   Need for prophylactic vaccination and inoculation against influenza       Relevant Orders   Flu Vaccine QUAD 36+ mos IM (Completed)      Note: This dictation was prepared with Dragon dictation along with smaller phrase technology. Any transcriptional errors that result from this process are unintentional.

## 2016-11-20 NOTE — Patient Instructions (Signed)
F/U 6 months

## 2016-11-21 ENCOUNTER — Other Ambulatory Visit: Payer: Self-pay | Admitting: Family Medicine

## 2016-11-21 LAB — PATHOLOGY

## 2016-11-29 ENCOUNTER — Encounter: Payer: Self-pay | Admitting: Family Medicine

## 2016-11-29 MED ORDER — CEPHALEXIN 500 MG PO CAPS: 500.0000 mg | ORAL_CAPSULE | Freq: Four times a day (QID) | ORAL | 0 refills | Status: DC

## 2016-12-17 ENCOUNTER — Other Ambulatory Visit: Payer: Self-pay

## 2016-12-17 DIAGNOSIS — Z803 Family history of malignant neoplasm of breast: Secondary | ICD-10-CM

## 2016-12-17 DIAGNOSIS — Z9189 Other specified personal risk factors, not elsewhere classified: Secondary | ICD-10-CM

## 2016-12-31 DIAGNOSIS — Z9189 Other specified personal risk factors, not elsewhere classified: Secondary | ICD-10-CM

## 2016-12-31 HISTORY — DX: Other specified personal risk factors, not elsewhere classified: Z91.89

## 2017-01-07 ENCOUNTER — Telehealth: Payer: Self-pay | Admitting: Obstetrics and Gynecology

## 2017-01-07 NOTE — Telephone Encounter (Signed)
Ok to close encounter.  Patient has been advised.

## 2017-01-07 NOTE — Telephone Encounter (Signed)
Contacted [facility] regarding status of MRI referral. Per [facility] patient had not returned their calls placed on 12/18/16, 12/25/16 for scheduling. Mardene Celeste with [facility] advised they spoke with patient 01/02/17. Patient advised she did not want to schedule the MRI, until she spoke with her insurance company.  I have contacted the patient to follow up, patient advised she had not spoken with her insurance company and would like to defer scheduling, until she can confirm 2018 benefits for the test. I encouraged patient to follow up with her insurance company and [facility], for scheduling the recommended. Patient is agreeable.  I also explained to the patient, that authorization has already been obtained and will be valid through 01/11/17. Patient understood.     Routing to Dr Quincy Simmonds for review

## 2017-01-08 NOTE — Telephone Encounter (Signed)
Per provider, ok to close encounted

## 2017-01-09 ENCOUNTER — Ambulatory Visit
Admission: RE | Admit: 2017-01-09 | Discharge: 2017-01-09 | Disposition: A | Discharge status: Home / Self Care | Payer: BLUE CROSS/BLUE SHIELD | Source: Ambulatory Visit | Attending: Obstetrics and Gynecology | Admitting: Obstetrics and Gynecology

## 2017-01-09 ENCOUNTER — Encounter: Payer: Self-pay | Admitting: Obstetrics and Gynecology

## 2017-01-09 DIAGNOSIS — Z9189 Other specified personal risk factors, not elsewhere classified: Secondary | ICD-10-CM

## 2017-01-09 DIAGNOSIS — Z803 Family history of malignant neoplasm of breast: Secondary | ICD-10-CM

## 2017-01-09 MED ORDER — GADOBENATE DIMEGLUMINE 529 MG/ML IV SOLN
18.0000 mL | Freq: Once | INTRAVENOUS | Status: AC | PRN
  Administered 2017-01-09: 18 mL via INTRAVENOUS

## 2017-01-18 NOTE — Progress Notes (Signed)
56 y.o. [id number] Widowed [place] American female here for annual exam.    Doing keto diet and has lost 6 pounds so far.   FH of breast cancer.  BRCA testing has not been done.  She did not go for the genetics appt. Yet. Patient had normal breast MRI this year.   No more hot flashes.   Going on a cruise to [place].   PCP:   Vic Blackbird   Patient's last menstrual period was 12/31/2004 (approximate).           Sexually active: No.  The current method of family planning is status post hysterectomy.    Exercising: Yes.    zumba Smoker:  Former  Health Maintenance: Pap:  01-04-16 Neg:Neg HR HPV History of abnormal Pap:  Yes,  remote history of abnormal pap after second child born 17 years ago.  Had cryotherapy. Follow up paps normal.  MMG:  11-20-16 Density A/Neg/BiRads1:TBC. Bil.MRI of breasts 01-09-17 negative for malignancy/BiRads1/screening yearly:[facility] Colonoscopy:  07/2016 - f/u 10 years  BMD:   n/a  TDaP:  2015  Gardasil:   N/A HIV: 01/04/16 Neg  Hep C: 2016 Negative  Screening Labs:  Hb today: PCP, Urine today: PCP   reports that she quit smoking about 20 years ago. Her smoking use included Cigarettes. She has never used smokeless tobacco. She reports that she drinks alcohol. She reports that she does not use drugs.  Past Medical History:  Diagnosis Date  . Carpal tunnel syndrome of right wrist   . Elevated cholesterol   . Increased risk of breast cancer 2018   27.7 lifetime risk.  Yearly MRI of breast.   . Low back pain   . OAB (overactive bladder)   . Seizures ([place])     Past Surgical History:  Procedure Laterality Date  . ABDOMINAL HYSTERECTOMY  06/23/2004   supracervical hysterectomy.  Ovaries remain.  Marland Kitchen CESAREAN SECTION    . ECTOPIC PREGNANCY SURGERY      Current Outpatient Prescriptions  Medication Sig Dispense Refill  . atorvastatin (LIPITOR) 20 MG tablet Take 1 tablet (20 mg total) by mouth at bedtime. 90 tablet 3  . cetirizine  (ZYRTEC) 10 MG tablet Take 1 tablet (10 mg total) by mouth daily. 90 tablet 3  . Cholecalciferol (VITAMIN D3) 2000 units capsule TAKE 1 CAPSULE BY MOUTH DAILY 30 capsule 0  . ciprofloxacin (CILOXAN) 0.3 % ophthalmic solution Administer 1 drop, every 4 hours, while awake, for the next 5 days. 5 mL 0  . fluticasone (FLONASE) 50 MCG/ACT nasal spray Place 2 sprays into both nostrils daily. 48 g 3  . hydrochlorothiazide (HYDRODIURIL) 25 MG tablet Take 1 tablet (25 mg total) by mouth daily. 90 tablet 3  . ibuprofen (ADVIL,MOTRIN) 800 MG tablet as needed.  0  . lidocaine (LIDODERM) 5 % Place 1 patch onto the skin daily. Remove & Discard patch within 12 hours or as directed by MD 90 patch 3  . LORazepam (ATIVAN) 1 MG tablet Take 1 tablet (1 mg total) by mouth 2 (two) times daily as needed for anxiety. 30 tablet 1  . phenytoin (DILANTIN) 100 MG ER capsule Take 2 capsules (200 mg total) by mouth every evening. 180 capsule 3  . vitamin B-12 (CYANOCOBALAMIN) 100 MCG tablet Take 1 tablet (100 mcg total) by mouth daily. (Patient not taking: Reported on 01/23/2017) 90 tablet 3   No current facility-administered medications for this visit.     Family History  Problem Relation Age  of Onset  . Breast cancer Mother 44    breast, recurrence 5 ys later to other breast then mastectomy  . Hypertension Mother   . Lung cancer Father 16    lung   . Diabetes Father   . Hypertension Father   . Breast cancer Sister 54    breast wih recurrence 10 yrs later and bilateral mastectomy  . Hypertension Sister     ROS:  Pertinent items are noted in HPI.  Otherwise, a comprehensive ROS was negative.  Exam:   BP 116/70 (BP Location: Right Arm, Patient Position: Sitting, Cuff Size: Large)   Pulse 74   Resp 18   Ht 5' 1.5" (1.562 m)   Wt 187 lb (84.8 kg)   LMP 12/31/2004 (Approximate)   BMI 34.76 kg/m     General appearance: alert, cooperative and appears stated age Head: Normocephalic, without obvious abnormality,  atraumatic Neck: no adenopathy, supple, symmetrical, trachea midline and thyroid normal to inspection and palpation Lungs: clear to auscultation bilaterally Breasts: normal appearance, no masses or tenderness, No nipple retraction or dimpling, No nipple discharge or bleeding, No axillary or supraclavicular adenopathy Heart: regular rate and rhythm Abdomen: soft, non-tender; no masses, no organomegaly Extremities: extremities normal, atraumatic, no cyanosis or edema Skin: Skin color, texture, turgor normal. No rashes or lesions Lymph nodes: Cervical, supraclavicular, and axillary nodes normal. No abnormal inguinal nodes palpated Neurologic: Grossly normal  Pelvic: External genitalia:  no lesions              Urethra:  normal appearing urethra with no masses, tenderness or lesions              Bartholins and Skenes: normal                 Vagina: normal appearing vagina with normal color and discharge, no lesions              Cervix: no lesions              Pap taken: No. Bimanual Exam:  Uterus:   Absent.              Adnexa: no mass, fullness, tenderness              Rectal exam: Yes.  .  Confirms.              Anus:  normal sphincter tone, no lesions  Chaperone was present for exam.  Assessment:   Well woman visit with normal exam. Status post supracervical hyst.  Ovaries remain.  Remote hx of abnormal pap. FH of premenopausal breast cancer.   Plan: Mammogram screening discussed. Discussed yearly MRI of breast. Recommended self breast awareness. Pap and HR HPV as above. Guidelines for Calcium, Vitamin D, regular exercise program including cardiovascular and weight bearing exercise. Referral for genetic screening.  Follow up annually and prn.     After visit summary provided.

## 2017-01-23 ENCOUNTER — Ambulatory Visit (INDEPENDENT_AMBULATORY_CARE_PROVIDER_SITE_OTHER): Payer: BC Managed Care – PPO | Admitting: Obstetrics and Gynecology

## 2017-01-23 ENCOUNTER — Encounter: Payer: Self-pay | Admitting: Obstetrics and Gynecology

## 2017-01-23 VITALS — BP 116/70 | HR 74 | Resp 18 | Ht 61.5 in | Wt 187.0 lb

## 2017-01-23 DIAGNOSIS — Z803 Family history of malignant neoplasm of breast: Secondary | ICD-10-CM | POA: Diagnosis not present

## 2017-01-23 DIAGNOSIS — Z01419 Encounter for gynecological examination (general) (routine) without abnormal findings: Secondary | ICD-10-CM

## 2017-01-23 NOTE — Patient Instructions (Signed)

## 2017-01-26 ENCOUNTER — Other Ambulatory Visit: Payer: Self-pay | Admitting: Family Medicine

## 2017-01-26 ENCOUNTER — Encounter: Payer: Self-pay | Admitting: Family Medicine

## 2017-01-28 MED ORDER — HYDROCHLOROTHIAZIDE 25 MG PO TABS: 25.0000 mg | ORAL_TABLET | Freq: Every day | ORAL | 1 refills | Status: DC

## 2017-02-05 ENCOUNTER — Encounter (HOSPITAL_COMMUNITY): Payer: Self-pay

## 2017-02-05 DIAGNOSIS — R51 Headache: Secondary | ICD-10-CM | POA: Diagnosis not present

## 2017-02-05 DIAGNOSIS — Z87891 Personal history of nicotine dependence: Secondary | ICD-10-CM | POA: Insufficient documentation

## 2017-02-05 DIAGNOSIS — Z79899 Other long term (current) drug therapy: Secondary | ICD-10-CM | POA: Insufficient documentation

## 2017-02-05 DIAGNOSIS — R1013 Epigastric pain: Secondary | ICD-10-CM | POA: Insufficient documentation

## 2017-02-05 DIAGNOSIS — I10 Essential (primary) hypertension: Secondary | ICD-10-CM | POA: Insufficient documentation

## 2017-02-05 MED ORDER — ONDANSETRON 4 MG PO TBDP
ORAL_TABLET | ORAL | Status: AC
  Filled 2017-02-05: qty 1

## 2017-02-05 MED ORDER — ONDANSETRON 4 MG PO TBDP
4.0000 mg | ORAL_TABLET | Freq: Once | ORAL | Status: AC
  Administered 2017-02-05: 4 mg via ORAL

## 2017-02-05 NOTE — ED Triage Notes (Signed)
Pt states that she was at home and took her BP and it was 157/98. Pt states that she has not felt well today and also has a headache.

## 2017-02-05 NOTE — ED Notes (Signed)
Called for triage with no answer

## 2017-02-06 ENCOUNTER — Emergency Department (HOSPITAL_COMMUNITY)
Admission: EM | Admit: 2017-02-06 | Discharge: 2017-02-06 | Disposition: A | Discharge status: Home / Self Care | Payer: BLUE CROSS/BLUE SHIELD | Attending: Emergency Medicine | Admitting: Emergency Medicine

## 2017-02-06 DIAGNOSIS — R51 Headache: Secondary | ICD-10-CM

## 2017-02-06 DIAGNOSIS — I10 Essential (primary) hypertension: Secondary | ICD-10-CM

## 2017-02-06 DIAGNOSIS — R519 Headache, unspecified: Secondary | ICD-10-CM

## 2017-02-06 DIAGNOSIS — R1013 Epigastric pain: Secondary | ICD-10-CM

## 2017-02-06 MED ORDER — ACETAMINOPHEN 500 MG PO TABS
1000.0000 mg | ORAL_TABLET | Freq: Once | ORAL | Status: AC
  Administered 2017-02-06: 1000 mg via ORAL
  Filled 2017-02-06: qty 2

## 2017-02-06 MED ORDER — GI COCKTAIL ~~LOC~~
30.0000 mL | Freq: Once | ORAL | Status: AC
  Administered 2017-02-06: 30 mL via ORAL
  Filled 2017-02-06: qty 30

## 2017-02-06 MED ORDER — ONDANSETRON 4 MG PO TBDP: 4.0000 mg | ORAL_TABLET | Freq: Three times a day (TID) | ORAL | 0 refills | Status: DC | PRN

## 2017-02-06 MED ORDER — PANTOPRAZOLE SODIUM 40 MG PO TBEC: 40.0000 mg | DELAYED_RELEASE_TABLET | Freq: Every day | ORAL | 0 refills | Status: DC

## 2017-02-06 MED ORDER — PANTOPRAZOLE SODIUM 40 MG PO TBEC
40.0000 mg | DELAYED_RELEASE_TABLET | Freq: Once | ORAL | Status: AC
  Administered 2017-02-06: 40 mg via ORAL
  Filled 2017-02-06: qty 1

## 2017-02-06 NOTE — Discharge Instructions (Signed)
You may alternate between Tylenol 1000 mg every 6 hours as needed for pain and ibuprofen 800 mg every 8 hours as needed for pain. Please drink plenty of fluids. Please take your hydrochlorothiazide as prescribed and follow up with your primary care physician for close monitoring of your blood pressure. We are discharging you with Protonix and Zofran which can take for upper abdominal pain, heartburn.  If you tell there are develop severe headache, vision changes, chest pain or shortness of breath, numbness or weakness on one side of your body, please return to the emergency department.

## 2017-02-06 NOTE — ED Provider Notes (Signed)
By signing my name below, I, Avnee Patel, attest that this documentation has been prepared under the direction and in the presence of [place], DO  Electronically Signed: Delton Pra[place], ED Scribe. 02/06/17. 3:31 AM.  TIME SEEN: 3:21 AM  CHIEF COMPLAINT:  Chief Complaint  Patient presents with  . Hypertension    HPI:   Lisa [name] is a 56 y.o. female, with a hx of HTN on 25 mg hydrochlorothiazide, who presents to the Emergency Department complaining of moderate headache onset yesterday. Pt also reports nausea, current burning abdominal pain and an elevated blood pressure with a reading of 157/98 at home. She states she checked her blood pressure at home due to her symptoms. She has taken TUMS for her nausea with no relief. Pt denies chest pain, SOB, numbness, tingling, weakness to her extremities, Vision changes, recent head injury or any other associated symptoms. No severe or sudden onset headache. Headache is getting better with time. Blood pressure is in the 120s/80s currently. Reports compliance with her medications.  ROS: See HPI Constitutional: no fever  Eyes: no drainage  ENT: no runny nose   Cardiovascular:  no chest pain  Resp: no SOB  GI: no vomiting GU: no dysuria Integumentary: no rash  Allergy: no hives  Musculoskeletal: no leg swelling  Neurological: no slurred speech ROS otherwise negative  PAST MEDICAL HISTORY/PAST SURGICAL HISTORY:  Past Medical History:  Diagnosis Date  . Carpal tunnel syndrome of right wrist   . Elevated cholesterol   . Increased risk of breast cancer 2018   27.7 lifetime risk.  Yearly MRI of breast.   . Low back pain   . OAB (overactive bladder)   . Seizures ([place])     MEDICATIONS:  Prior to Admission medications   Medication Sig Start Date End Date Taking? Authorizing Provider  atorvastatin (LIPITOR) 20 MG tablet Take 1 tablet (20 mg total) by mouth at bedtime. 11/20/16   Alycia Rossetti, MD  cetirizine (ZYRTEC) 10 MG  tablet Take 1 tablet (10 mg total) by mouth daily. 11/20/16   Alycia Rossetti, MD  Cholecalciferol (VITAMIN D3) 2000 units capsule TAKE 1 CAPSULE BY MOUTH DAILY 11/21/16   Alycia Rossetti, MD  ciprofloxacin (CILOXAN) 0.3 % ophthalmic solution Administer 1 drop, every 4 hours, while awake, for the next 5 days. 11/20/16   Alycia Rossetti, MD  fluticasone (FLONASE) 50 MCG/ACT nasal spray Place 2 sprays into both nostrils daily. 11/20/16   Alycia Rossetti, MD  hydrochlorothiazide (HYDRODIURIL) 25 MG tablet TAKE 1 TABLET(25 MG) BY MOUTH DAILY 01/28/17   Alycia Rossetti, MD  ibuprofen (ADVIL,MOTRIN) 800 MG tablet as needed. 01/21/17   Historical Provider, MD  lidocaine (LIDODERM) 5 % Place 1 patch onto the skin daily. Remove & Discard patch within 12 hours or as directed by MD 11/20/16   Alycia Rossetti, MD  LORazepam (ATIVAN) 1 MG tablet Take 1 tablet (1 mg total) by mouth 2 (two) times daily as needed for anxiety. 01/07/15   Alycia Rossetti, MD  phenytoin (DILANTIN) 100 MG ER capsule Take 2 capsules (200 mg total) by mouth every evening. 11/20/16   Alycia Rossetti, MD  vitamin B-12 (CYANOCOBALAMIN) 100 MCG tablet Take 1 tablet (100 mcg total) by mouth daily. Patient not taking: Reported on 01/23/2017 01/25/16   Alycia Rossetti, MD    ALLERGIES:  No Known Allergies  SOCIAL HISTORY:  Social History  Substance Use Topics  . Smoking status: Former Smoker  Types: Cigarettes    Quit date: 12/31/1996  . Smokeless tobacco: Never Used  . Alcohol use Yes     Comment: Rare    FAMILY HISTORY: Family History  Problem Relation Age of Onset  . Breast cancer Mother 56    breast, recurrence 5 ys later to other breast then mastectomy  . Hypertension Mother   . Lung cancer Father 84    lung   . Diabetes Father   . Hypertension Father   . Breast cancer Sister 77    breast wih recurrence 10 yrs later and bilateral mastectomy  . Hypertension Sister     EXAM: BP 157/85   Pulse 61   Temp 98.3 F  (36.8 C) (Oral)   Resp 18   Ht 5\' 2"  (1.575 m)   Wt 186 lb (84.4 kg)   LMP 12/31/2004 (Approximate)   SpO2 97%   BMI 34.02 kg/m  CONSTITUTIONAL: Alert and oriented and responds appropriately to questions. Well-appearing; well-nourished HEAD: Normocephalic EYES: Conjunctivae clear, PERRL, EOMI ENT: normal nose; no rhinorrhea; moist mucous membranes NECK: Supple, no meningismus, no nuchal rigidity, no LAD  CARD: RRR; S1 and S2 appreciated; no murmurs, no clicks, no rubs, no gallops RESP: Normal chest excursion without splinting or tachypnea; breath sounds clear and equal bilaterally; no wheezes, no rhonchi, no rales, no hypoxia or respiratory distress, speaking full sentences ABD/GI: Normal bowel sounds; non-distended; soft, non-tender, no rebound, no guarding, no peritoneal signs, no hepatosplenomegaly BACK:  The back appears normal and is non-tender to palpation, there is no CVA tenderness EXT: Normal ROM in all joints; non-tender to palpation; no edema; normal capillary refill; no cyanosis, no calf tenderness or swelling    SKIN: Normal color for age and race; warm; no rash NEURO: Moves all extremities equally, sensation to light touch intact diffusely, cranial nerves II through XII intact, normal speech. Strength 5/5 in all 4 extremities, ambulatory with normal gait PSYCH: The patient's mood and manner are appropriate. Grooming and personal hygiene are appropriate.  MEDICAL DECISION MAKING: Patient here with complaints of mildly elevated blood pressure. Blood pressure is reassuring here. Complains of mild frontal throbbing headache but no sign onset, severe headache. No neurologic deficits. No chest pain or shortness of breath. On exam, patient is very well-appearing with a normal neuro exam. I doubt intracranial hemorrhage or stroke. States she is feeling much better. Is having some epigastric burning pain which could be gastritis, GERD abdominal exam is benign. Doubt cholecystitis,  pancreatitis, perforated ulcer. We'll treat symptomatically with Tylenol, Protonix, GI cocktail. I do not feel she needs head CT, labs, urine, EKG at this time. She denies having any chest pain or shortness of breath. I do not feel we need to alter her blood pressure medication. Discussed with her that her symptoms could have led to her blood pressure being high and given that her symptoms improved and her blood pressure improved with them after being given Zofran and not further blood pressure medication it seems that the symptoms may have been driving the blood pressure. I recommend close follow-up with her primary care provider. Discussed at length return precautions. She is comfortable with this plan.  At this time, I do not feel there is any life-threatening condition present. I have reviewed and discussed all results (EKG, imaging, lab, urine as appropriate) and exam findings with patient/family. I have reviewed nursing notes and appropriate previous records.  I feel the patient is safe to be discharged home without further emergent workup and  can continue workup as an outpatient as needed. Discussed usual and customary return precautions. Patient/family verbalize understanding and are comfortable with this plan.  Outpatient follow-up has been provided. All questions have been answered.   I personally performed the services described in this documentation, which was scribed in my presence. The recorded information has been reviewed and is accurate.     [place], DO 02/06/17 [phone]

## 2017-02-11 ENCOUNTER — Ambulatory Visit (INDEPENDENT_AMBULATORY_CARE_PROVIDER_SITE_OTHER): Payer: BC Managed Care – PPO | Admitting: Family Medicine

## 2017-02-11 ENCOUNTER — Encounter: Payer: Self-pay | Admitting: Family Medicine

## 2017-02-11 ENCOUNTER — Telehealth: Payer: Self-pay | Admitting: Obstetrics and Gynecology

## 2017-02-11 ENCOUNTER — Telehealth: Payer: Self-pay | Admitting: Family Medicine

## 2017-02-11 VITALS — BP 132/62 | HR 80 | Temp 98.6°F | Resp 14 | Ht 61.5 in | Wt 182.0 lb

## 2017-02-11 DIAGNOSIS — I10 Essential (primary) hypertension: Secondary | ICD-10-CM

## 2017-02-11 DIAGNOSIS — Z6833 Body mass index (BMI) 33.0-33.9, adult: Secondary | ICD-10-CM | POA: Diagnosis not present

## 2017-02-11 DIAGNOSIS — M5432 Sciatica, left side: Secondary | ICD-10-CM | POA: Diagnosis not present

## 2017-02-11 DIAGNOSIS — E6609 Other obesity due to excess calories: Secondary | ICD-10-CM | POA: Diagnosis not present

## 2017-02-11 MED ORDER — IBUPROFEN 800 MG PO TABS: 800.0000 mg | ORAL_TABLET | Freq: Four times a day (QID) | ORAL | 0 refills | Status: DC | PRN

## 2017-02-11 NOTE — Assessment & Plan Note (Signed)
Left-sided back pain with some sciatica-like symptoms. We'll obtain a x-ray of the lumbar spine this is been going on for quite some time. We actually evaluate this back in 2013. Discussed yoga and working on her core and stretching her back. She continues to use the Lidoderm patches.

## 2017-02-11 NOTE — Telephone Encounter (Signed)
Patient is asking if Dr. Quincy Simmonds can write a rebuttal letter for her. It is for her insurance denial for MRI.

## 2017-02-11 NOTE — Telephone Encounter (Signed)
okay

## 2017-02-11 NOTE — Patient Instructions (Addendum)
Continue current medications F/U 2 months for labs

## 2017-02-11 NOTE — Assessment & Plan Note (Signed)
Currently on a ketogenic diet follow-up in 2 months

## 2017-02-11 NOTE — Progress Notes (Signed)
Subjective:    Patient ID: Lisa [name], female    DOB: 1961/12/14, 56 y.o.   MRN: [id number]  Patient presents for ER F/U (HTN- is not fasting)  Pt here to f/u blood pressure, seen in ED on 2/7, she presented with hedache, Nausea and abdominal pain, blood pressure was elevated at home 150/90's. In the ED BP was 120's/ 80 per reports. Though vitals showed 157/85 She has been taking HCTZ as prescribed. She states that she recently come back from a cruise and she took her scopolamine patch off early she's not sure if this is what made her nauseous. She never had any diarrhea or change in her bowel movements. She was given Zofran in the waiting room to the emergency room and then her blood pressure came down on its own. She was sent home with proton aches which she took for 2 days as well as Zofran which she typically 3 days and her symptoms resolved. She feels like she is now back to her baseline. She also states that she has been on a ketogenic diet for the past 4 weeks and has noted that the increase in bacon have been making her nauseous.  Her blood pressure has been normal since then when she checks at home.  Continues to have back pain mostly on the left side sometimes radiates into her buttocks and hip. She does use Lidoderm patches needed or takes Tylenol. She denies any change in bowel bladder no tingling or numbness in her extremities.   Review Of Systems:  GEN- denies fatigue, fever, weight loss,weakness, recent illness HEENT- denies eye drainage, change in vision, nasal discharge, CVS- denies chest pain, palpitations RESP- denies SOB, cough, wheeze ABD- denies N/V, change in stools, abd pain GU- denies dysuria, hematuria, dribbling, incontinence MSK- +oint pain, muscle aches, injury Neuro- denies headache, dizziness, syncope, seizure activity       Objective:    BP 132/62   Pulse 80   Temp 98.6 F (37 C) (Oral)   Resp 14   Ht 5' 1.5" (1.562 m)   Wt 182 lb (82.6 kg)    LMP 12/31/2004 (Approximate)   SpO2 98%   BMI 33.83 kg/m  GEN- NAD, alert and oriented x3 HEENT- PERRL, EOMI, non injected sclera, pink conjunctiva, MMM, oropharynx clear Neck- Supple, no thyromegaly CVS- RRR, no murmur RESP-CTAB MSK- Spine NT, mild TTP left paraspinals, neg SLR, good ROM spine, hips  EXT- No edema Pulses- Radial  2+        Assessment & Plan:      Problem List Items Addressed This Visit    Sciatica - Primary    Left-sided back pain with some sciatica-like symptoms. We'll obtain a x-ray of the lumbar spine this is been going on for quite some time. We actually evaluate this back in 2013. Discussed yoga and working on her core and stretching her back. She continues to use the Lidoderm patches.      Relevant Orders   DG Lumbar Spine Complete   Obese    Currently on a ketogenic diet follow-up in 2 months      Essential hypertension    Blood pressure is controlled to change her medication. She is adapting to her new diet which she has lost 11 pounds within the past month. I'll not check her labs today is often on the ketogenic diet he had an increase in the triglycerides and other levels initially with this diet. We will recheck her fasting labs in  2 months         Note: This dictation was prepared with Dragon dictation along with smaller phrase technology. Any transcriptional errors that result from this process are unintentional.

## 2017-02-11 NOTE — Telephone Encounter (Signed)
Spoke with patient. Patient states she had MRI 01/09/17 for family history of breast cancer- mother, sister. Patient states she has received a letter of denial of coverage of MRI. Advised patient would forward message to business office coordinator for follow-up. Patient is agreeable.  Routing to provider for final review. Patient is agreeable to disposition. Will close encounter.

## 2017-02-11 NOTE — Telephone Encounter (Signed)
Prescription sent to pharmacy.

## 2017-02-11 NOTE — Assessment & Plan Note (Signed)
Blood pressure is controlled to change her medication. She is adapting to her new diet which she has lost 11 pounds within the past month. I'll not check her labs today is often on the ketogenic diet he had an increase in the triglycerides and other levels initially with this diet. We will recheck her fasting labs in 2 months

## 2017-02-11 NOTE — Telephone Encounter (Signed)
Ok to refill 

## 2017-02-11 NOTE — Telephone Encounter (Signed)
Pt wants to know if she can get a script for ibuprofen 800mg  sent to [facility] for her back pain. Please call back.

## 2017-02-25 ENCOUNTER — Encounter: Payer: Self-pay | Admitting: Family Medicine

## 2017-02-25 ENCOUNTER — Ambulatory Visit (INDEPENDENT_AMBULATORY_CARE_PROVIDER_SITE_OTHER): Payer: BC Managed Care – PPO | Admitting: Family Medicine

## 2017-02-25 VITALS — BP 130/72 | HR 68 | Temp 98.9°F | Resp 12 | Ht 61.5 in | Wt 179.0 lb

## 2017-02-25 DIAGNOSIS — M7062 Trochanteric bursitis, left hip: Secondary | ICD-10-CM

## 2017-02-25 DIAGNOSIS — M549 Dorsalgia, unspecified: Secondary | ICD-10-CM | POA: Diagnosis not present

## 2017-02-25 DIAGNOSIS — M5430 Sciatica, unspecified side: Secondary | ICD-10-CM

## 2017-02-25 MED ORDER — METHYLPREDNISOLONE 4 MG PO TBPK: ORAL_TABLET | ORAL | 0 refills | Status: DC

## 2017-02-25 MED ORDER — IBUPROFEN 800 MG PO TABS: 800.0000 mg | ORAL_TABLET | Freq: Four times a day (QID) | ORAL | 0 refills | Status: DC | PRN

## 2017-02-25 MED ORDER — HYDROCODONE-ACETAMINOPHEN 5-325 MG PO TABS: 1.0000 | ORAL_TABLET | Freq: Four times a day (QID) | ORAL | 0 refills | Status: DC | PRN

## 2017-02-25 MED ORDER — IBUPROFEN 800 MG PO TABS: 800.0000 mg | ORAL_TABLET | Freq: Four times a day (QID) | ORAL | 1 refills | Status: DC | PRN

## 2017-02-25 NOTE — Patient Instructions (Signed)
Referral to orthopedics  Take steroids as precribed Take pain medication at bedtime F/U as needed

## 2017-02-25 NOTE — Progress Notes (Signed)
   Subjective:    Patient ID: Lisa [name], female    DOB: [birth year], 56 y.o.   MRN: [id number]  Patient presents for L Hip Pain (L hip pain radiating down leg)  Pt here with continued left sided hip pain, last visit on 2/12 she states she sometimes gets back pain that radiates to her buttocks and hip. She uses lidoderm patches, takes tylenol.  At that time no change in bowel or bladder and this is still true.  My recommendation at that time with back pain with sciatica, she was actually evaluated for same thing back in 2013.  I recommended she get xray but she has not been able to do this.  The past week she's had increased pain on the left side when she rolls over at bedtime she has severe pain in her hip which extends all the way down to her calf. She has been taking ibuprofen which helps some. She states that she did discuss this with her orthopedist who did recommend that she come in to have her back evaluated and she would like to proceed with doing this.  Review Of Systems:  GEN- denies fatigue, fever, weight loss,weakness, recent illness HEENT- denies eye drainage, change in vision, nasal discharge, CVS- denies chest pain, palpitations RESP- denies SOB, cough, wheeze ABD- denies N/V, change in stools, abd pain GU- denies dysuria, hematuria, dribbling, incontinence MSK-+joint pain, muscle aches, injury Neuro- denies headache, dizziness, syncope, seizure activity       Objective:    BP 130/72   Pulse 68   Temp 98.9 F (37.2 C) (Oral)   Resp 12   Ht 5' 1.5" (1.562 m)   Wt 179 lb (81.2 kg)   LMP 12/31/2004 (Approximate)   SpO2 98%   BMI 33.27 kg/m  GEN- NAD, alert and oriented x3 MSK- Spine NT, mild TTP left paraspinals, neg SLR, TTP along left lateral hip/greater trochanter good ROM spine, hips , mild pain with ER of left hip EXT- No edema Pulses- Radial  2+        Assessment & Plan:      Problem List Items Addressed This Visit    None    Visit Diagnoses     Greater trochanteric bursitis of left hip    -  Primary   Back pain with sciatica       Back pain concern for some DDD, that could be causing some nerve impingment, but her worsneing hip pain, concern for bursitis as well. Will give MEDROL DOSE PAK, norco for bedtime Refer to orthopedics She can hold motrin while on steroids  Imaging can be done at ortho office    Relevant Medications   methylPREDNISolone (MEDROL DOSEPAK) 4 MG TBPK tablet   HYDROcodone-acetaminophen (NORCO) 5-325 MG tablet   ibuprofen (ADVIL,MOTRIN) 800 MG tablet      Note: This dictation was prepared with Dragon dictation along with smaller phrase technology. Any transcriptional errors that result from this process are unintentional.

## 2017-03-15 ENCOUNTER — Other Ambulatory Visit: Payer: Self-pay | Admitting: *Deleted

## 2017-03-15 MED ORDER — HYDROCHLOROTHIAZIDE 25 MG PO TABS: ORAL_TABLET | ORAL | 0 refills | Status: DC

## 2017-03-19 ENCOUNTER — Encounter: Payer: Self-pay | Admitting: Family Medicine

## 2017-03-19 ENCOUNTER — Encounter: Payer: Self-pay | Admitting: *Deleted

## 2017-03-22 ENCOUNTER — Encounter: Payer: Self-pay | Admitting: Family Medicine

## 2017-03-28 ENCOUNTER — Encounter: Payer: Self-pay | Admitting: Family Medicine

## 2017-04-03 ENCOUNTER — Telehealth: Payer: Self-pay | Admitting: Family Medicine

## 2017-04-03 NOTE — Telephone Encounter (Signed)
International aid/development worker in [place] for back/hip pain.  He can not Rx Flexeril that she has used in the past.  Wants to know if we can send order into [facility] [address]  10 mg TID prn  Says mostly takes only one a day as needed

## 2017-04-03 NOTE — Telephone Encounter (Signed)
Okay, flexeril 10mg  q 8hrs prn #30

## 2017-04-05 MED ORDER — CYCLOBENZAPRINE HCL 10 MG PO TABS: 10.0000 mg | ORAL_TABLET | Freq: Three times a day (TID) | ORAL | 0 refills | Status: DC | PRN

## 2017-04-05 NOTE — Telephone Encounter (Signed)
Rx called in and pt aware

## 2017-05-03 NOTE — Patient Instructions (Signed)
Lisa [name]  05/03/2017   Your procedure is scheduled on: 05/07/17  Report to [facility] Main  Entrance Take [place]  elevators to 3rd floor to  [place] at    1030 AM.    Call this number if you have problems the morning of surgery [phone]    Remember: ONLY 1 PERSON MAY GO WITH YOU TO SHORT STAY TO GET  READY MORNING OF [place].  Do not eat food or drink liquids :After Midnight.     Take these medicines the morning of surgery with A SIP OF WATER: eye drops as usual, hydrocodone if needed                                You may not have any metal on your body including hair pins and              piercings  Do not wear jewelry, make-up, lotions, powders or perfumes, deodorant             Do not wear nail polish.  Do not shave  48 hours prior to surgery.               Do not bring valuables to the hospital. [name].  Contacts, dentures or bridgework may not be worn into surgery.  Leave suitcase in the car. After surgery it may be brought to your room.                Please read over the following fact sheets you were given: _____________________________________________________________________           [facility] - Preparing for Surgery Before surgery, you can play an important role.  Because skin is not sterile, your skin needs to be as free of germs as possible.  You can reduce the number of germs on your skin by washing with CHG (chlorahexidine gluconate) soap before surgery.  CHG is an antiseptic cleaner which kills germs and bonds with the skin to continue killing germs even after washing. Please DO NOT use if you have an allergy to CHG or antibacterial soaps.  If your skin becomes reddened/irritated stop using the CHG and inform your nurse when you arrive at Short Stay. Do not shave (including legs and underarms) for at least 48 hours prior to the first CHG shower.  You may shave your  face/neck. Please follow these instructions carefully:  1.  Shower with CHG Soap the night before surgery and the  morning of Surgery.  2.  If you choose to wash your hair, wash your hair first as usual with your  normal  shampoo.  3.  After you shampoo, rinse your hair and body thoroughly to remove the  shampoo.                           4.  Use CHG as you would any other liquid soap.  You can apply chg directly  to the skin and wash                       Gently with a scrungie or clean washcloth.  5.  Apply the CHG  Soap to your body ONLY FROM THE NECK DOWN.   Do not use on face/ open                           Wound or open sores. Avoid contact with eyes, ears mouth and genitals (private parts).                       Wash face,  Genitals (private parts) with your normal soap.             6.  Wash thoroughly, paying special attention to the area where your surgery  will be performed.  7.  Thoroughly rinse your body with warm water from the neck down.  8.  DO NOT shower/wash with your normal soap after using and rinsing off  the CHG Soap.                9.  Pat yourself dry with a clean towel.            10.  Wear clean pajamas.            11.  Place clean sheets on your bed the night of your first shower and do not  sleep with pets. Day of Surgery : Do not apply any lotions/deodorants the morning of surgery.  Please wear clean clothes to the hospital/surgery center.  FAILURE TO FOLLOW THESE INSTRUCTIONS MAY RESULT IN THE CANCELLATION OF YOUR SURGERY PATIENT SIGNATURE_________________________________  NURSE SIGNATURE__________________________________  ________________________________________________________________________   Lisa [name]  An incentive spirometer is a tool that can help keep your lungs clear and active. This tool measures how well you are filling your lungs with each breath. Taking long deep breaths may help reverse or decrease the chance of developing breathing  (pulmonary) problems (especially infection) following:  A long period of time when you are unable to move or be active. BEFORE THE PROCEDURE   If the spirometer includes an indicator to show your best effort, your nurse or respiratory therapist will set it to a desired goal.  If possible, sit up straight or lean slightly forward. Try not to slouch.  Hold the incentive spirometer in an upright position. INSTRUCTIONS FOR USE  1. Sit on the edge of your bed if possible, or sit up as far as you can in bed or on a chair. 2. Hold the incentive spirometer in an upright position. 3. Breathe out normally. 4. Place the mouthpiece in your mouth and seal your lips tightly around it. 5. Breathe in slowly and as deeply as possible, raising the piston or the ball toward the top of the column. 6. Hold your breath for 3-5 seconds or for as long as possible. Allow the piston or ball to fall to the bottom of the column. 7. Remove the mouthpiece from your mouth and breathe out normally. 8. Rest for a few seconds and repeat Steps 1 through 7 at least 10 times every 1-2 hours when you are awake. Take your time and take a few normal breaths between deep breaths. 9. The spirometer may include an indicator to show your best effort. Use the indicator as a goal to work toward during each repetition. 10. After each set of 10 deep breaths, practice coughing to be sure your lungs are clear. If you have an incision (the cut made at the time of surgery), support your incision when coughing by placing a pillow or rolled up towels  firmly against it. Once you are able to get out of bed, walk around indoors and cough well. You may stop using the incentive spirometer when instructed by your caregiver.  RISKS AND COMPLICATIONS  Take your time so you do not get dizzy or light-headed.  If you are in pain, you may need to take or ask for pain medication before doing incentive spirometry. It is harder to take a deep breath if you  are having pain. AFTER USE  Rest and breathe slowly and easily.  It can be helpful to keep track of a log of your progress. Your caregiver can provide you with a simple table to help with this. If you are using the spirometer at home, follow these instructions: [place] IF:   You are having difficultly using the spirometer.  You have trouble using the spirometer as often as instructed.  Your pain medication is not giving enough relief while using the spirometer.  You develop fever of 100.5 F (38.1 C) or higher. SEEK IMMEDIATE MEDICAL CARE IF:   You cough up bloody sputum that had not been present before.  You develop fever of 102 F (38.9 C) or greater.  You develop worsening pain at or near the incision site. MAKE SURE YOU:   Understand these instructions.  Will watch your condition.  Will get help right away if you are not doing well or get worse. Document Released: 04/29/2007 Document Revised: 03/10/2012 Document Reviewed: 06/30/2007 [facility] Patient Information 2014 [place], [place].   ________________________________________________________________________

## 2017-05-05 ENCOUNTER — Encounter: Payer: Self-pay | Admitting: Family Medicine

## 2017-05-06 ENCOUNTER — Ambulatory Visit (HOSPITAL_COMMUNITY)
Admission: RE | Admit: 2017-05-06 | Discharge: 2017-05-06 | Disposition: A | Discharge status: Home / Self Care | Payer: BC Managed Care – PPO | Source: Ambulatory Visit | Attending: Surgical | Admitting: Surgical

## 2017-05-06 ENCOUNTER — Encounter (HOSPITAL_COMMUNITY)
Admission: RE | Admit: 2017-05-06 | Discharge: 2017-05-06 | Disposition: A | Discharge status: Home / Self Care | Payer: BC Managed Care – PPO | Source: Ambulatory Visit | Attending: Orthopedic Surgery | Admitting: Orthopedic Surgery

## 2017-05-06 ENCOUNTER — Encounter (HOSPITAL_COMMUNITY): Payer: Self-pay

## 2017-05-06 DIAGNOSIS — M7138 Other bursal cyst, other site: Secondary | ICD-10-CM | POA: Diagnosis not present

## 2017-05-06 DIAGNOSIS — Z01812 Encounter for preprocedural laboratory examination: Secondary | ICD-10-CM | POA: Diagnosis not present

## 2017-05-06 DIAGNOSIS — Z01818 Encounter for other preprocedural examination: Secondary | ICD-10-CM

## 2017-05-06 DIAGNOSIS — Z01810 Encounter for preprocedural cardiovascular examination: Secondary | ICD-10-CM | POA: Diagnosis not present

## 2017-05-06 DIAGNOSIS — M4316 Spondylolisthesis, lumbar region: Secondary | ICD-10-CM | POA: Insufficient documentation

## 2017-05-06 DIAGNOSIS — M48061 Spinal stenosis, lumbar region without neurogenic claudication: Secondary | ICD-10-CM | POA: Insufficient documentation

## 2017-05-06 HISTORY — DX: Essential (primary) hypertension: I10

## 2017-05-06 HISTORY — DX: Pneumonia, unspecified organism: J18.9

## 2017-05-06 HISTORY — DX: Family history of other specified conditions: Z84.89

## 2017-05-06 LAB — CBC WITH DIFFERENTIAL/PLATELET
Basophils Absolute: 0 10*3/uL (ref 0.0–0.1)
Basophils Relative: 0 %
Eosinophils Absolute: 0.1 10*3/uL (ref 0.0–0.7)
Eosinophils Relative: 1 %
HCT: 38.7 % (ref 36.0–46.0)
Hemoglobin: 13.1 g/dL (ref 12.0–15.0)
Lymphocytes Relative: 43 %
Lymphs Abs: 2.9 10*3/uL (ref 0.7–4.0)
MCH: 29.2 pg (ref 26.0–34.0)
MCHC: 33.9 g/dL (ref 30.0–36.0)
MCV: 86.4 fL (ref 78.0–100.0)
Monocytes Absolute: 0.6 10*3/uL (ref 0.1–1.0)
Monocytes Relative: 9 %
Neutro Abs: 3.3 10*3/uL (ref 1.7–7.7)
Neutrophils Relative %: 47 %
Platelets: 301 10*3/uL (ref 150–400)
RBC: 4.48 MIL/uL (ref 3.87–5.11)
RDW: 15.3 % (ref 11.5–15.5)
WBC: 6.8 10*3/uL (ref 4.0–10.5)

## 2017-05-06 LAB — COMPREHENSIVE METABOLIC PANEL
ALT: 77 U/L — ABNORMAL HIGH (ref 14–54)
AST: 43 U/L — ABNORMAL HIGH (ref 15–41)
Albumin: 4.3 g/dL (ref 3.5–5.0)
Alkaline Phosphatase: 77 U/L (ref 38–126)
Anion gap: 13 (ref 5–15)
BUN: 19 mg/dL (ref 6–20)
CO2: 28 mmol/L (ref 22–32)
Calcium: 8.9 mg/dL (ref 8.9–10.3)
Chloride: 96 mmol/L — ABNORMAL LOW (ref 101–111)
Creatinine, Ser: 0.97 mg/dL (ref 0.44–1.00)
GFR calc Af Amer: >60 mL/min (ref >60)
GFR calc non Af Amer: >60 mL/min (ref >60)
Glucose, Bld: 94 mg/dL (ref 65–99)
Potassium: 3.3 mmol/L — ABNORMAL LOW (ref 3.5–5.1)
Sodium: 137 mmol/L (ref 135–145)
Total Bilirubin: 1 mg/dL (ref 0.3–1.2)
Total Protein: 7.8 g/dL (ref 6.5–8.1)

## 2017-05-06 LAB — PROTIME-INR
INR: 0.92
Prothrombin Time: 12.3 seconds (ref 11.4–15.2)

## 2017-05-06 LAB — SURGICAL PCR SCREEN
MRSA, PCR: NEGATIVE
STAPHYLOCOCCUS AUREUS: NEGATIVE

## 2017-05-06 LAB — APTT: aPTT: 40 seconds — ABNORMAL HIGH (ref 24–36)

## 2017-05-06 NOTE — Progress Notes (Signed)
cmp and ptt done 05/06/17 routed to Dr. Gladstone Lighter via epic.

## 2017-05-07 ENCOUNTER — Ambulatory Visit (HOSPITAL_COMMUNITY): Payer: BC Managed Care – PPO

## 2017-05-07 ENCOUNTER — Observation Stay (HOSPITAL_COMMUNITY)
Admission: RE | Admit: 2017-05-07 | Discharge: 2017-05-08 | Disposition: A | Discharge status: Home / Self Care | Payer: BC Managed Care – PPO | Source: Ambulatory Visit | Attending: Orthopedic Surgery | Admitting: Orthopedic Surgery

## 2017-05-07 ENCOUNTER — Encounter (HOSPITAL_COMMUNITY)
Admission: RE | Disposition: A | Discharge status: Home / Self Care | Payer: Self-pay | Source: Ambulatory Visit | Attending: Orthopedic Surgery

## 2017-05-07 ENCOUNTER — Ambulatory Visit (HOSPITAL_COMMUNITY): Payer: BC Managed Care – PPO | Admitting: Anesthesiology

## 2017-05-07 ENCOUNTER — Encounter (HOSPITAL_COMMUNITY): Payer: Self-pay

## 2017-05-07 DIAGNOSIS — I10 Essential (primary) hypertension: Secondary | ICD-10-CM | POA: Diagnosis not present

## 2017-05-07 DIAGNOSIS — Z79899 Other long term (current) drug therapy: Secondary | ICD-10-CM | POA: Diagnosis not present

## 2017-05-07 DIAGNOSIS — M7138 Other bursal cyst, other site: Principal | ICD-10-CM | POA: Insufficient documentation

## 2017-05-07 DIAGNOSIS — Z7951 Long term (current) use of inhaled steroids: Secondary | ICD-10-CM | POA: Insufficient documentation

## 2017-05-07 DIAGNOSIS — R569 Unspecified convulsions: Secondary | ICD-10-CM | POA: Diagnosis not present

## 2017-05-07 DIAGNOSIS — M21372 Foot drop, left foot: Secondary | ICD-10-CM | POA: Diagnosis not present

## 2017-05-07 DIAGNOSIS — M48062 Spinal stenosis, lumbar region with neurogenic claudication: Secondary | ICD-10-CM | POA: Diagnosis present

## 2017-05-07 DIAGNOSIS — Z87891 Personal history of nicotine dependence: Secondary | ICD-10-CM | POA: Diagnosis not present

## 2017-05-07 DIAGNOSIS — M48061 Spinal stenosis, lumbar region without neurogenic claudication: Secondary | ICD-10-CM | POA: Diagnosis present

## 2017-05-07 DIAGNOSIS — M4316 Spondylolisthesis, lumbar region: Secondary | ICD-10-CM | POA: Diagnosis not present

## 2017-05-07 HISTORY — PX: LUMBAR LAMINECTOMY/DECOMPRESSION MICRODISCECTOMY: SHX5026

## 2017-05-07 SURGERY — LUMBAR LAMINECTOMY/DECOMPRESSION MICRODISCECTOMY
Anesthesia: General | Laterality: Left

## 2017-05-07 MED ORDER — METHOCARBAMOL 500 MG PO TABS
500.0000 mg | ORAL_TABLET | Freq: Four times a day (QID) | ORAL | Status: DC | PRN
  Administered 2017-05-08 (×2): 500 mg via ORAL
  Filled 2017-05-07 (×2): qty 1

## 2017-05-07 MED ORDER — POLYETHYLENE GLYCOL 3350 17 G PO PACK: 17.0000 g | PACK | Freq: Every day | ORAL | Status: DC | PRN

## 2017-05-07 MED ORDER — ROCURONIUM BROMIDE 50 MG/5ML IV SOSY
PREFILLED_SYRINGE | INTRAVENOUS | Status: AC
  Filled 2017-05-07: qty 5

## 2017-05-07 MED ORDER — HYDROCODONE-ACETAMINOPHEN 5-325 MG PO TABS
1.0000 | ORAL_TABLET | Freq: Four times a day (QID) | ORAL | Status: DC | PRN
  Administered 2017-05-08 (×2): 1 via ORAL
  Filled 2017-05-07 (×3): qty 1

## 2017-05-07 MED ORDER — CEFAZOLIN SODIUM-DEXTROSE 2-4 GM/100ML-% IV SOLN
2.0000 g | INTRAVENOUS | Status: AC
  Administered 2017-05-07: 2 g via INTRAVENOUS
  Filled 2017-05-07: qty 100

## 2017-05-07 MED ORDER — PROMETHAZINE HCL 25 MG PO TABS: 25.0000 mg | ORAL_TABLET | Freq: Four times a day (QID) | ORAL | Status: DC | PRN

## 2017-05-07 MED ORDER — SODIUM CHLORIDE 0.9 % IR SOLN
Status: DC | PRN
  Administered 2017-05-07: 500 mL

## 2017-05-07 MED ORDER — HYDROMORPHONE HCL 1 MG/ML IJ SOLN
INTRAMUSCULAR | Status: AC
Start: 2017-05-07 — End: 2017-05-08
  Filled 2017-05-07: qty 1.5

## 2017-05-07 MED ORDER — HYDROMORPHONE HCL 1 MG/ML IJ SOLN
0.2500 mg | INTRAMUSCULAR | Status: DC | PRN
  Administered 2017-05-07 (×4): 0.5 mg via INTRAVENOUS

## 2017-05-07 MED ORDER — PHENYTOIN SODIUM EXTENDED 100 MG PO CAPS
200.0000 mg | ORAL_CAPSULE | Freq: Every evening | ORAL | Status: DC
  Filled 2017-05-07 (×2): qty 2

## 2017-05-07 MED ORDER — ROCURONIUM BROMIDE 10 MG/ML (PF) SYRINGE
PREFILLED_SYRINGE | INTRAVENOUS | Status: DC | PRN
  Administered 2017-05-07: 40 mg via INTRAVENOUS
  Administered 2017-05-07: 10 mg via INTRAVENOUS

## 2017-05-07 MED ORDER — SUGAMMADEX SODIUM 200 MG/2ML IV SOLN
INTRAVENOUS | Status: DC | PRN
  Administered 2017-05-07: 200 mg via INTRAVENOUS

## 2017-05-07 MED ORDER — BISACODYL 5 MG PO TBEC: 5.0000 mg | DELAYED_RELEASE_TABLET | Freq: Every day | ORAL | Status: DC | PRN

## 2017-05-07 MED ORDER — ONDANSETRON HCL 4 MG/2ML IJ SOLN
INTRAMUSCULAR | Status: DC | PRN
  Administered 2017-05-07: 4 mg via INTRAVENOUS

## 2017-05-07 MED ORDER — HYDROMORPHONE HCL 1 MG/ML IJ SOLN
INTRAMUSCULAR | Status: AC
  Administered 2017-05-07: 0.5 mg
  Filled 2017-05-07: qty 0.5

## 2017-05-07 MED ORDER — BACITRACIN-NEOMYCIN-POLYMYXIN 400-5-5000 EX OINT
TOPICAL_OINTMENT | CUTANEOUS | Status: AC
  Filled 2017-05-07: qty 1

## 2017-05-07 MED ORDER — FLEET ENEMA 7-19 GM/118ML RE ENEM: 1.0000 | ENEMA | Freq: Once | RECTAL | Status: DC | PRN

## 2017-05-07 MED ORDER — CIPROFLOXACIN HCL 0.3 % OP SOLN
2.0000 [drp] | Freq: Two times a day (BID) | OPHTHALMIC | Status: DC
  Administered 2017-05-07 – 2017-05-08 (×2): 2 [drp] via OPHTHALMIC
  Filled 2017-05-07: qty 2.5

## 2017-05-07 MED ORDER — ONDANSETRON HCL 4 MG PO TABS: 4.0000 mg | ORAL_TABLET | Freq: Four times a day (QID) | ORAL | Status: DC | PRN

## 2017-05-07 MED ORDER — PROMETHAZINE HCL 25 MG/ML IJ SOLN: 6.2500 mg | INTRAMUSCULAR | Status: DC | PRN

## 2017-05-07 MED ORDER — FENTANYL CITRATE (PF) 100 MCG/2ML IJ SOLN
INTRAMUSCULAR | Status: DC | PRN
  Administered 2017-05-07 (×2): 50 ug via INTRAVENOUS

## 2017-05-07 MED ORDER — LIDOCAINE-EPINEPHRINE 1 %-1:100000 IJ SOLN
INTRAMUSCULAR | Status: AC
  Filled 2017-05-07: qty 1

## 2017-05-07 MED ORDER — EPHEDRINE SULFATE 50 MG/ML IJ SOLN
INTRAMUSCULAR | Status: DC | PRN
  Administered 2017-05-07 (×2): 10 mg via INTRAVENOUS

## 2017-05-07 MED ORDER — LACTATED RINGERS IV SOLN
INTRAVENOUS | Status: DC
  Administered 2017-05-07 (×2): via INTRAVENOUS

## 2017-05-07 MED ORDER — ONDANSETRON HCL 4 MG/2ML IJ SOLN
4.0000 mg | Freq: Four times a day (QID) | INTRAMUSCULAR | Status: DC | PRN
  Administered 2017-05-07: 17:00:00 4 mg via INTRAVENOUS
  Filled 2017-05-07: qty 2

## 2017-05-07 MED ORDER — ONDANSETRON HCL 4 MG/2ML IJ SOLN
INTRAMUSCULAR | Status: AC
  Filled 2017-05-07: qty 2

## 2017-05-07 MED ORDER — HYDROMORPHONE HCL 1 MG/ML IJ SOLN
INTRAMUSCULAR | Status: AC
  Filled 2017-05-07: qty 0.5

## 2017-05-07 MED ORDER — ACETAMINOPHEN 325 MG PO TABS: 650.0000 mg | ORAL_TABLET | ORAL | Status: DC | PRN

## 2017-05-07 MED ORDER — FENTANYL CITRATE (PF) 100 MCG/2ML IJ SOLN
INTRAMUSCULAR | Status: AC
  Filled 2017-05-07: qty 2

## 2017-05-07 MED ORDER — BUPIVACAINE LIPOSOME 1.3 % IJ SUSP
INTRAMUSCULAR | Status: DC | PRN
  Administered 2017-05-07: 20 mL

## 2017-05-07 MED ORDER — CHLORHEXIDINE GLUCONATE 4 % EX LIQD: 60.0000 mL | Freq: Once | CUTANEOUS | Status: DC

## 2017-05-07 MED ORDER — SUGAMMADEX SODIUM 200 MG/2ML IV SOLN
INTRAVENOUS | Status: AC
  Filled 2017-05-07: qty 2

## 2017-05-07 MED ORDER — MIDAZOLAM HCL 5 MG/5ML IJ SOLN
INTRAMUSCULAR | Status: DC | PRN
  Administered 2017-05-07: 2 mg via INTRAVENOUS

## 2017-05-07 MED ORDER — MIDAZOLAM HCL 2 MG/2ML IJ SOLN
INTRAMUSCULAR | Status: AC
  Filled 2017-05-07: qty 2

## 2017-05-07 MED ORDER — LIDOCAINE-EPINEPHRINE 1 %-1:100000 IJ SOLN
INTRAMUSCULAR | Status: DC | PRN
  Administered 2017-05-07: 20 mL

## 2017-05-07 MED ORDER — DEXAMETHASONE SODIUM PHOSPHATE 10 MG/ML IJ SOLN
INTRAMUSCULAR | Status: DC | PRN
  Administered 2017-05-07: 10 mg via INTRAVENOUS

## 2017-05-07 MED ORDER — PROPOFOL 10 MG/ML IV BOLUS
INTRAVENOUS | Status: AC
  Filled 2017-05-07: qty 20

## 2017-05-07 MED ORDER — MENTHOL 3 MG MT LOZG: 1.0000 | LOZENGE | OROMUCOSAL | Status: DC | PRN

## 2017-05-07 MED ORDER — HYDROMORPHONE HCL 1 MG/ML IJ SOLN: 1.0000 mg | INTRAMUSCULAR | Status: DC | PRN

## 2017-05-07 MED ORDER — ACETAMINOPHEN 650 MG RE SUPP: 650.0000 mg | RECTAL | Status: DC | PRN

## 2017-05-07 MED ORDER — CEFAZOLIN SODIUM-DEXTROSE 1-4 GM/50ML-% IV SOLN
1.0000 g | Freq: Three times a day (TID) | INTRAVENOUS | Status: AC
  Administered 2017-05-07 – 2017-05-08 (×3): 1 g via INTRAVENOUS
  Filled 2017-05-07 (×3): qty 50

## 2017-05-07 MED ORDER — BUPIVACAINE LIPOSOME 1.3 % IJ SUSP
20.0000 mL | Freq: Once | INTRAMUSCULAR | Status: DC
  Filled 2017-05-07: qty 20

## 2017-05-07 MED ORDER — PHENOL 1.4 % MT LIQD: 1.0000 | OROMUCOSAL | Status: DC | PRN

## 2017-05-07 MED ORDER — EPHEDRINE 5 MG/ML INJ
INTRAVENOUS | Status: AC
  Filled 2017-05-07: qty 10

## 2017-05-07 MED ORDER — HYDROCHLOROTHIAZIDE 25 MG PO TABS
25.0000 mg | ORAL_TABLET | Freq: Every day | ORAL | Status: DC
  Administered 2017-05-08: 25 mg via ORAL
  Filled 2017-05-07: qty 1

## 2017-05-07 MED ORDER — LIDOCAINE 2% (20 MG/ML) 5 ML SYRINGE
INTRAMUSCULAR | Status: DC | PRN
  Administered 2017-05-07: 100 mg via INTRAVENOUS

## 2017-05-07 MED ORDER — LIDOCAINE 2% (20 MG/ML) 5 ML SYRINGE
INTRAMUSCULAR | Status: AC
Start: 2017-05-07 — End: 2017-05-07
  Filled 2017-05-07: qty 5

## 2017-05-07 MED ORDER — METHOCARBAMOL 1000 MG/10ML IJ SOLN
500.0000 mg | Freq: Four times a day (QID) | INTRAVENOUS | Status: DC | PRN
  Administered 2017-05-07: 500 mg via INTRAVENOUS
  Filled 2017-05-07: qty 5
  Filled 2017-05-07: qty 550

## 2017-05-07 MED ORDER — SODIUM CHLORIDE 0.9 % IR SOLN
Status: AC
  Filled 2017-05-07: qty 500000

## 2017-05-07 MED ORDER — PROPOFOL 10 MG/ML IV BOLUS
INTRAVENOUS | Status: DC | PRN
  Administered 2017-05-07: 170 mg via INTRAVENOUS

## 2017-05-07 MED ORDER — LIP MEDEX EX OINT
TOPICAL_OINTMENT | CUTANEOUS | Status: AC
  Administered 2017-05-07: 17:00:00
  Filled 2017-05-07: qty 7

## 2017-05-07 MED ORDER — LACTATED RINGERS IV SOLN
INTRAVENOUS | Status: DC
  Administered 2017-05-07 – 2017-05-08 (×2): via INTRAVENOUS

## 2017-05-07 MED ORDER — DEXAMETHASONE SODIUM PHOSPHATE 10 MG/ML IJ SOLN
INTRAMUSCULAR | Status: AC
  Filled 2017-05-07: qty 1

## 2017-05-07 MED ORDER — SUCCINYLCHOLINE CHLORIDE 200 MG/10ML IV SOSY
PREFILLED_SYRINGE | INTRAVENOUS | Status: AC
  Filled 2017-05-07: qty 10

## 2017-05-07 MED ORDER — KETOROLAC TROMETHAMINE 30 MG/ML IJ SOLN
INTRAMUSCULAR | Status: AC
  Filled 2017-05-07: qty 1

## 2017-05-07 MED ORDER — KETOROLAC TROMETHAMINE 30 MG/ML IJ SOLN
30.0000 mg | Freq: Once | INTRAMUSCULAR | Status: DC | PRN
  Administered 2017-05-07: 30 mg via INTRAVENOUS

## 2017-05-07 MED ORDER — PROMETHAZINE HCL 25 MG/ML IJ SOLN
25.0000 mg | Freq: Four times a day (QID) | INTRAMUSCULAR | Status: DC | PRN
  Filled 2017-05-07: qty 1

## 2017-05-07 MED ORDER — OXYCODONE-ACETAMINOPHEN 5-325 MG PO TABS
2.0000 | ORAL_TABLET | ORAL | Status: DC | PRN
  Administered 2017-05-07 – 2017-05-08 (×4): 2 via ORAL
  Filled 2017-05-07 (×4): qty 2

## 2017-05-07 MED ORDER — SUCCINYLCHOLINE CHLORIDE 200 MG/10ML IV SOSY
PREFILLED_SYRINGE | INTRAVENOUS | Status: DC | PRN
  Administered 2017-05-07: 120 mg via INTRAVENOUS

## 2017-05-07 SURGICAL SUPPLY — 45 items
BAG ZIPLOCK 12X15 (MISCELLANEOUS) ×9 IMPLANT
BENZOIN TINCTURE PRP APPL 2/3 (GAUZE/BANDAGES/DRESSINGS) ×3 IMPLANT
CLEANER TIP ELECTROSURG 2X2 (MISCELLANEOUS) ×3 IMPLANT
COVER SURGICAL LIGHT HANDLE (MISCELLANEOUS) ×3 IMPLANT
DRAIN PENROSE 18X1/4 LTX STRL (WOUND CARE) IMPLANT
DRAPE MICROSCOPE LEICA (MISCELLANEOUS) ×3 IMPLANT
DRAPE POUCH INSTRU U-SHP 10X18 (DRAPES) ×3 IMPLANT
DRAPE SHEET LG 3/4 BI-LAMINATE (DRAPES) ×3 IMPLANT
DRAPE SURG 17X11 SM STRL (DRAPES) ×3 IMPLANT
DRSG ADAPTIC 3X8 NADH LF (GAUZE/BANDAGES/DRESSINGS) ×3 IMPLANT
DRSG PAD ABDOMINAL 8X10 ST (GAUZE/BANDAGES/DRESSINGS) ×6 IMPLANT
DURAPREP 26ML APPLICATOR (WOUND CARE) ×3 IMPLANT
ELECT BLADE TIP CTD 4 INCH (ELECTRODE) ×3 IMPLANT
ELECT REM PT RETURN 15FT ADLT (MISCELLANEOUS) ×3 IMPLANT
GAUZE SPONGE 4X4 12PLY STRL (GAUZE/BANDAGES/DRESSINGS) ×3 IMPLANT
GLOVE BIOGEL PI IND STRL 8.5 (GLOVE) ×1 IMPLANT
GLOVE BIOGEL PI INDICATOR 8.5 (GLOVE) ×2
GLOVE ECLIPSE 8.0 STRL XLNG CF (GLOVE) ×6 IMPLANT
GOWN STRL REUS W/TWL LRG LVL3 (GOWN DISPOSABLE) ×3 IMPLANT
GOWN STRL REUS W/TWL XL LVL3 (GOWN DISPOSABLE) ×6 IMPLANT
HEMOSTAT SPONGE AVITENE ULTRA (HEMOSTASIS) ×3 IMPLANT
KIT BASIN OR (CUSTOM PROCEDURE TRAY) ×3 IMPLANT
KIT POSITIONING SURG ANDREWS (MISCELLANEOUS) ×3 IMPLANT
MANIFOLD NEPTUNE II (INSTRUMENTS) ×3 IMPLANT
MARKER SKIN DUAL TIP RULER LAB (MISCELLANEOUS) ×3 IMPLANT
NEEDLE HYPO 22GX1.5 SAFETY (NEEDLE) ×6 IMPLANT
NEEDLE SPNL 18GX3.5 QUINCKE PK (NEEDLE) ×6 IMPLANT
PACK LAMINECTOMY ORTHO (CUSTOM PROCEDURE TRAY) ×3 IMPLANT
PAD ABD 8X10 STRL (GAUZE/BANDAGES/DRESSINGS) ×3 IMPLANT
PATTIES SURGICAL .5 X.5 (GAUZE/BANDAGES/DRESSINGS) IMPLANT
PATTIES SURGICAL .75X.75 (GAUZE/BANDAGES/DRESSINGS) ×3 IMPLANT
PATTIES SURGICAL 1X1 (DISPOSABLE) ×3 IMPLANT
PENCIL SMOKE EVAC W/HOLSTER (ELECTROSURGICAL) ×3 IMPLANT
PIN SAFETY NICK PLATE  2 MED (MISCELLANEOUS)
PIN SAFETY NICK PLATE 2 MED (MISCELLANEOUS) IMPLANT
SPONGE LAP 4X18 X RAY DECT (DISPOSABLE) ×3 IMPLANT
STAPLER VISISTAT 35W (STAPLE) ×3 IMPLANT
SUT BONE WAX W31G (SUTURE) ×3 IMPLANT
SUT VIC AB 0 CT1 27 (SUTURE) ×2
SUT VIC AB 0 CT1 27XBRD ANTBC (SUTURE) ×1 IMPLANT
SUT VIC AB 1 CT1 27 (SUTURE) ×6
SUT VIC AB 1 CT1 27XBRD ANTBC (SUTURE) ×3 IMPLANT
SYR 20CC LL (SYRINGE) ×6 IMPLANT
TAPE CLOTH SURG 6X10 WHT LF (GAUZE/BANDAGES/DRESSINGS) ×3 IMPLANT
TOWEL OR 17X26 10 PK STRL BLUE (TOWEL DISPOSABLE) ×3 IMPLANT

## 2017-05-07 NOTE — Anesthesia Preprocedure Evaluation (Signed)
Anesthesia Evaluation  Patient identified by MRN, date of birth, ID band Patient awake    Reviewed: Allergy & Precautions, NPO status , Patient's Chart, lab work & pertinent test results  Airway Mallampati: II  TM Distance: >3 FB Neck ROM: Full    Dental no notable dental hx.    Pulmonary former smoker,    Pulmonary exam normal breath sounds clear to auscultation       Cardiovascular hypertension, Normal cardiovascular exam Rhythm:Regular Rate:Normal     Neuro/Psych Seizures -, Well Controlled,  negative psych ROS   GI/Hepatic negative GI ROS, Neg liver ROS,   Endo/Other  negative endocrine ROS  Renal/GU negative Renal ROS  negative genitourinary   Musculoskeletal negative musculoskeletal ROS (+)   Abdominal   Peds negative pediatric ROS (+)  Hematology negative hematology ROS (+)   Anesthesia Other Findings   Reproductive/Obstetrics negative OB ROS                             Anesthesia Physical Anesthesia Plan  ASA: II  Anesthesia Plan: General   Post-op Pain Management:    Induction: Intravenous  Airway Management Planned: Oral ETT  Additional Equipment:   Intra-op Plan:   Post-operative Plan: Extubation in OR  Informed Consent: I have reviewed the patients History and Physical, chart, labs and discussed the procedure including the risks, benefits and alternatives for the proposed anesthesia with the patient or authorized representative who has indicated his/her understanding and acceptance.   Dental advisory given  Plan Discussed with: CRNA and Surgeon  Anesthesia Plan Comments:         Anesthesia Quick Evaluation

## 2017-05-07 NOTE — Brief Op Note (Signed)
05/07/2017  2:28 PM  PATIENT:  Aliviana P Topp  56 y.o. female  PRE-OPERATIVE DIAGNOSIS:  Synovial cyst and stenosis L4-L5 left  POST-OPERATIVE DIAGNOSIS:  Synovial cyst and Spinal Stenosis and Foraminal stenosis L4-L5 left  PROCEDURE:  Procedure(s): Decompression L4-L5 and excision of synovial cyst Left (Left) for Spinal Stenosis and foraminotomies for L-4 and L-5 nerve roots on the left.  SURGEON:  Surgeon(s) and Role:    * Latanya Maudlin, MD - Primary  PHYSICIAN ASSISTANT: Ardeen Jourdain PA  ASSISTANTS: Ardeen Jourdain PA   ANESTHESIA:   general  EBL:  Total I/O In: 1000 [I.V.:1000] Out: -   BLOOD ADMINISTERED:none  DRAINS: none   LOCAL MEDICATIONS USED:  LIDOCAINE 20cc of 1% with Epinephrine at the start of the case and Exparel 20cc at the end of the case.  SPECIMEN:  No Specimen  DISPOSITION OF SPECIMEN:  N/A  COUNTS:  YES  TOURNIQUET:  * No tourniquets in log *  DICTATION: .Other Dictation: Dictation Number [id number]  PLAN OF CARE: Admit for overnight observation  PATIENT DISPOSITION:  PACU - hemodynamically stable.   Delay start of Pharmacological VTE agent (>24hrs) due to surgical blood loss or risk of bleeding: yes

## 2017-05-07 NOTE — Anesthesia Postprocedure Evaluation (Signed)
Anesthesia Post Note  Patient: Lisa [name]  Procedure(s) Performed: Procedure(s) (LRB): Decompression L4-L5 and excision of synovial cyst Left (Left)  Patient location during evaluation: PACU Anesthesia Type: General Level of consciousness: awake and alert Pain management: pain level controlled Vital Signs Assessment: post-procedure vital signs reviewed and stable Respiratory status: spontaneous breathing, nonlabored ventilation, respiratory function stable and patient connected to nasal cannula oxygen Cardiovascular status: blood pressure returned to baseline and stable Postop Assessment: no signs of nausea or vomiting Anesthetic complications: no       Last Vitals:  Vitals:   05/07/17 1445 05/07/17 1500  BP: (!) 141/86 129/83  Pulse: 80 78  Resp: 15 14  Temp:      Last Pain:  Vitals:   05/07/17 1500  TempSrc:   PainSc: 4                  Glada Wickstrom S

## 2017-05-07 NOTE — Interval H&P Note (Signed)
History and Physical Interval Note:  05/07/2017 12:23 PM  Lisa [name]  has presented today for surgery, with the diagnosis of Synovial cyst and stenosis L4-L5 left  The various methods of treatment have been discussed with the patient and family. After consideration of risks, benefits and other options for treatment, the patient has consented to  Procedure(s): Decompression L4-L5 and excision of synovial cyst Left (Left) as a surgical intervention .  The patient's history has been reviewed, patient examined, no change in status, stable for surgery.  I have reviewed the patient's chart and labs.  Questions were answered to the patient's satisfaction.     Jonnae Fonseca A

## 2017-05-07 NOTE — Anesthesia Procedure Notes (Signed)
Procedure Name: Intubation Date/Time: 05/07/2017 12:31 PM Performed by: Lind Covert Pre-anesthesia Checklist: Patient identified, Emergency Drugs available, Suction available, Patient being monitored and Timeout performed Patient Re-evaluated:Patient Re-evaluated prior to inductionOxygen Delivery Method: Circle system utilized Preoxygenation: Pre-oxygenation with 100% oxygen Intubation Type: IV induction Ventilation: Mask ventilation without difficulty Laryngoscope Size: Mac and 3 Grade View: Grade I Tube type: Oral Tube size: 7.0 mm Number of attempts: 1 Airway Equipment and Method: Stylet Placement Confirmation: ETT inserted through vocal cords under direct vision,  positive ETCO2 and breath sounds checked- equal and bilateral Secured at: 22 cm Tube secured with: Tape Dental Injury: Teeth and Oropharynx as per pre-operative assessment

## 2017-05-07 NOTE — Transfer of Care (Signed)
Immediate Anesthesia Transfer of Care Note  Patient: Lisa [name]  Procedure(s) Performed: Procedure(s): Decompression L4-L5 and excision of synovial cyst Left (Left)  Patient Location: PACU  Anesthesia Type:General  Level of Consciousness: sedated  Airway & Oxygen Therapy: Patient Spontanous Breathing and Patient connected to face mask oxygen  Post-op Assessment: Report given to RN and Post -op Vital signs reviewed and stable  Post vital signs: Reviewed and stable  Last Vitals:  Vitals:   05/07/17 1033  BP: 125/82  Pulse: 73  Resp: 16  Temp: 37.3 C    Last Pain:  Vitals:   05/07/17 1106  TempSrc:   PainSc: 3       Patients Stated Pain Goal: 4 (53/61/44 3154)  Complications: No apparent anesthesia complications

## 2017-05-07 NOTE — Progress Notes (Signed)
Pt having a lot of nausea with one episode of emesis, unrelieved by zofran given at 17:03. Called answering service for [place]. Received a call back from [facility] and new orders for phenergan 25mg  PO OR IV Q6hrs PRN for N/V. New orders implemented.

## 2017-05-07 NOTE — Op Note (Signed)
NAMEJAIDIN, UGARTE             ACCOUNT NO.:  [id number]  MEDICAL RECORD NO.:  [id number]  LOCATION:                                 FACILITY:  PHYSICIAN:  Kipp Brood. Christie Copley, M.D.DATE OF BIRTH:  1961-03-02  DATE OF PROCEDURE:  05/07/2017 DATE OF DISCHARGE:                              OPERATIVE REPORT   SURGEON:  Kipp Brood. Gladstone Lighter, M.D.  ASSISTANT:  Ardeen Jourdain, [place].  PREOPERATIVE DIAGNOSES: 1. Grade 1 pseudospondylolisthesis, L4-L5. 2. Foraminal stenosis involving the L4 root and the L5 root on the     left. 3. Synovial cyst at L4-5 on the left. 4. Partial footdrop on the left.  POSTOPERATIVE DIAGNOSES: 1. Grade 1 pseudospondylolisthesis, L4-L5. 2. Foraminal stenosis involving the L4 root and the L5 root on the     left. 3. Synovial cyst at L4-5 on the left. 4. Partial footdrop on the left.  OPERATION: 1. Central decompressive lumbar laminectomy at L4-5 for severe spinal     stenosis. 2. Foraminotomy on the left for the L4 root. 3. Foraminotomy for the L5 root on the left. 4. Excision of synovial cyst with compression on the L5 nerve root.  DESCRIPTION OF PROCEDURE:  Under general anesthesia, routine orthopedic prep and draping of the lower back was carried out.  Appropriate time- out was first carried out.  I also marked the appropriate lower back on the left in the holding area.  After the time-out and prep, two needles were placed in the back for localization purposes and x-ray was taken. Incision then was made over the L4-5 interspace and the incision was extended proximally and distally.  At this time, the self-retaining retractors were inserted.  Note, the wound site was red as deep, so we took a great deal of time cauterizing the venous bleeders as well.  We stripped the muscle from the lamina and spinous process bilaterally.  We then took another x-ray with Kocher clamps in place.  Once we identified the L4-L5 space, I then went down and inserted the  McCullough retractors and cleared off the lateral gutters bilaterally.  Following that, I removed the spinous process at L4-5 and at this particular point, we then decompressed the central part of the L4-5 canal.  The canal was extremely tight because of the spondylolisthesis.  I went up proximally with the [place] rongeurs until we were now easily able to pass a hockey-stick proximally.  I then went on and preserved the right side as well as I could.  I went over to the affected left side, this partial facetectomy and decompressed the lateral recess.  We did a foraminotomy for the L4 root, we followed the L5 root out was extremely tight and this was due to the stenosis as well as the synovial cyst.  I gently cleared that off with a [place] #4.  I then went out and decompressed the lateral recess and did a nice foraminotomy for the L5 root.  Both roots now were extremely free.  I was able to easily move the root without any problem.  I did explore the disk.  There was no herniated disk.  I thoroughly irrigated out the area, loosely applied some Gelfoam.  Note, another x-ray was taken deep to verify we were at the proper position.  I then closed the wound layers in usual fashion.  I left a small deep distal and proximal part of the wound open for drainage purposes.  At the beginning of the case, I injected 20 mL of 1% lidocaine with epinephrine for hemostasis purposes.  At the end of the case prior to closing the skin, we injected 20 mL of Exparel subcu and skin was closed in usual fashion.  Sterile dressings were applied.  The patient had 2 g of IV Ancef.          ______________________________ Kipp Brood. Gladstone Lighter, M.D.     RAG/MEDQ  D:  05/07/2017  T:  05/07/2017  Job:  [id number]

## 2017-05-07 NOTE — H&P (Signed)
Lisa [name] is an 56 y.o. female.   Chief Complaint: Pain in [place]. HPI: Progressive pain in her left leg.  Past Medical History:  Diagnosis Date  . Carpal tunnel syndrome of right wrist   . Elevated cholesterol   . Family history of adverse reaction to anesthesia    sister has [place]  . Hypertension   . Increased risk of breast cancer 2018   27.7 lifetime risk.  Yearly MRI of breast.   . Low back pain   . OAB (overactive bladder)   . Pneumonia   . Seizures ([place])     Past Surgical History:  Procedure Laterality Date  . ABDOMINAL HYSTERECTOMY  06/23/2004   supracervical hysterectomy.  Ovaries remain.  Marland Kitchen CESAREAN SECTION    . ECTOPIC PREGNANCY SURGERY      Family History  Problem Relation Age of Onset  . Breast cancer Mother 69    breast, recurrence 5 ys later to other breast then mastectomy  . Hypertension Mother   . Lung cancer Father 27    lung   . Diabetes Father   . Hypertension Father   . Breast cancer Sister 61    breast wih recurrence 10 yrs later and bilateral mastectomy  . Hypertension Sister    Social History:  reports that she quit smoking about 20 years ago. Her smoking use included Cigarettes. She has never used smokeless tobacco. She reports that she drinks alcohol. She reports that she does not use drugs.  Allergies: No Known Allergies  Medications Prior to Admission  Medication Sig Dispense Refill  . cetirizine (ZYRTEC) 10 MG tablet Take 1 tablet (10 mg total) by mouth daily. (Patient taking differently: Take 10 mg by mouth daily as needed for allergies. ) 90 tablet 3  . Cholecalciferol (VITAMIN D3) 2000 units capsule TAKE 1 CAPSULE BY MOUTH DAILY (Patient taking differently: TAKE 1 CAPSULE BY MOUTH ONCE A WEEK ON SUNDAY'S) 30 capsule 0  . ciprofloxacin (CILOXAN) 0.3 % ophthalmic solution Place 2 drops into both eyes 2 (two) times daily.    . cyclobenzaprine (FLEXERIL) 10 MG tablet Take 1 tablet (10 mg total) by mouth every 8 (eight) hours as  needed for muscle spasms. 30 tablet 0  . fluticasone (FLONASE) 50 MCG/ACT nasal spray Place 2 sprays into both nostrils daily. (Patient taking differently: Place 2 sprays into both nostrils every other day. ) 48 g 3  . hydrochlorothiazide (HYDRODIURIL) 25 MG tablet TAKE 1 TABLET(25 MG) BY MOUTH DAILY 90 tablet 0  . HYDROcodone-acetaminophen (NORCO) 5-325 MG tablet Take 1 tablet by mouth every 6 (six) hours as needed for moderate pain. 30 tablet 0  . ibuprofen (ADVIL,MOTRIN) 200 MG tablet Take 200 mg by mouth 2 (two) times daily as needed for mild pain.    Marland Kitchen ibuprofen (ADVIL,MOTRIN) 800 MG tablet Take 1 tablet (800 mg total) by mouth every 6 (six) hours as needed. (Patient taking differently: Take 800 mg by mouth 2 (two) times daily as needed for mild pain. ) 45 tablet 1  . lidocaine (LIDODERM) 5 % Place 1 patch onto the skin daily. Remove & Discard patch within 12 hours or as directed by MD 90 patch 3  . phenytoin (DILANTIN) 100 MG ER capsule Take 2 capsules (200 mg total) by mouth every evening. 180 capsule 3  . predniSONE (DELTASONE) 10 MG tablet Take 10 mg by mouth 2 (two) times daily.  0  . atorvastatin (LIPITOR) 20 MG tablet Take 1 tablet (20 mg total) by  mouth at bedtime. (Patient not taking: Reported on 05/02/2017) 90 tablet 3  . methylPREDNISolone (MEDROL DOSEPAK) 4 MG TBPK tablet Take as directed on package (Patient not taking: Reported on 05/02/2017) 21 tablet 0  . OVER THE COUNTER MEDICATION Take 1 tablet by mouth 3 (three) times daily. CONNEXIN MULTIVITAMIN    . vitamin B-12 (CYANOCOBALAMIN) 100 MCG tablet Take 1 tablet (100 mcg total) by mouth daily. (Patient not taking: Reported on 05/02/2017) 90 tablet 3    Results for orders placed or performed during the hospital encounter of 05/06/17 (from the past 48 hour(s))  Surgical pcr screen     Status: None   Collection Time: 05/06/17  2:03 PM  Result Value Ref Range   MRSA, PCR NEGATIVE NEGATIVE   Staphylococcus aureus NEGATIVE NEGATIVE     Comment:        The Xpert SA Assay (FDA approved for NASAL specimens in patients over 80 years of age), is one component of a comprehensive surveillance program.  Test performance has been validated by [facility] for patients greater than or equal to 71 year old. It is not intended to diagnose infection nor to guide or monitor treatment.   APTT     Status: Abnormal   Collection Time: 05/06/17  2:30 PM  Result Value Ref Range   aPTT 40 (H) 24 - 36 seconds    Comment:        IF BASELINE aPTT IS ELEVATED, SUGGEST PATIENT RISK ASSESSMENT BE USED TO DETERMINE APPROPRIATE ANTICOAGULANT THERAPY.   CBC WITH DIFFERENTIAL     Status: None   Collection Time: 05/06/17  2:30 PM  Result Value Ref Range   WBC 6.8 4.0 - 10.5 K/uL   RBC 4.48 3.87 - 5.11 MIL/uL   Hemoglobin 13.1 12.0 - 15.0 g/dL   HCT 38.7 36.0 - 46.0 %   MCV 86.4 78.0 - 100.0 fL   MCH 29.2 26.0 - 34.0 pg   MCHC 33.9 30.0 - 36.0 g/dL   RDW 15.3 11.5 - 15.5 %   Platelets 301 150 - 400 K/uL   Neutrophils Relative % 47 %   Neutro Abs 3.3 1.7 - 7.7 K/uL   Lymphocytes Relative 43 %   Lymphs Abs 2.9 0.7 - 4.0 K/uL   Monocytes Relative 9 %   Monocytes Absolute 0.6 0.1 - 1.0 K/uL   Eosinophils Relative 1 %   Eosinophils Absolute 0.1 0.0 - 0.7 K/uL   Basophils Relative 0 %   Basophils Absolute 0.0 0.0 - 0.1 K/uL  Comprehensive metabolic panel     Status: Abnormal   Collection Time: 05/06/17  2:30 PM  Result Value Ref Range   Sodium 137 135 - 145 mmol/L   Potassium 3.3 (L) 3.5 - 5.1 mmol/L   Chloride 96 (L) 101 - 111 mmol/L   CO2 28 22 - 32 mmol/L   Glucose, Bld 94 65 - 99 mg/dL   BUN 19 6 - 20 mg/dL   Creatinine, Ser 0.97 0.44 - 1.00 mg/dL   Calcium 8.9 8.9 - 10.3 mg/dL   Total Protein 7.8 6.5 - 8.1 g/dL   Albumin 4.3 3.5 - 5.0 g/dL   AST 43 (H) 15 - 41 U/L   ALT 77 (H) 14 - 54 U/L   Alkaline Phosphatase 77 38 - 126 U/L   Total Bilirubin 1.0 0.3 - 1.2 mg/dL   GFR calc non Af Amer >60 >60 mL/min   GFR calc Af Amer  >60 >60 mL/min  Comment: (NOTE) The eGFR has been calculated using the CKD EPI equation. This calculation has not been validated in all clinical situations. eGFR's persistently <60 mL/min signify possible Chronic Kidney Disease.    Anion gap 13 5 - 15  Protime-INR     Status: None   Collection Time: 05/06/17  2:30 PM  Result Value Ref Range   Prothrombin Time 12.3 11.4 - 15.2 seconds   INR 0.92    Dg Chest 2 View  Result Date: 05/06/2017 CLINICAL DATA:  Chronic low back pain. Patient for lumbar surgery tomorrow. EXAM: CHEST  2 VIEW COMPARISON:  PA and lateral chest 05/01/2016. FINDINGS: Lungs are clear. Heart size is normal. No pneumothorax or pleural effusion. No acute bony abnormality. IMPRESSION: No acute disease. Electronically Signed   By: Inge Rise M.D.   On: 05/06/2017 17:11   Dg Lumbar Spine 2-3 Views  Result Date: 05/06/2017 CLINICAL DATA:  Low back pain.  Preop spine surgery. EXAM: LUMBAR SPINE - 2-3 VIEW COMPARISON:  None. FINDINGS: There are 5 nonrib bearing lumbar-type vertebral bodies. The vertebral body heights are maintained. There is grade 1 anterolisthesis of L4 on L5 secondary to facet disease. There is no spondylolysis. There is no acute fracture. The disc spaces are maintained. There is bilateral facet disease at L4-5 and L5-S1. The SI joints are unremarkable. IMPRESSION: Grade 1 anterolisthesis of L4 on L5 secondary to facet disease. Electronically Signed   By: Kathreen Devoid   On: 05/06/2017 17:14    Review of Systems  Constitutional: Negative.   HENT: Negative.   Eyes: Negative.   Respiratory: Negative.   Cardiovascular: Negative.   Gastrointestinal: Negative.   Genitourinary: Negative.   Musculoskeletal: Positive for back pain.  Skin: Negative.   Neurological: Positive for focal weakness.  Endo/Heme/Allergies: Negative.   Psychiatric/Behavioral: Negative.     Blood pressure 125/82, pulse 73, temperature 99.2 F (37.3 C), temperature source Oral,  resp. rate 16, height 5' 2"  (1.575 m), weight 76.2 kg (168 lb), last menstrual period 12/31/2004, SpO2 99 %. Physical Exam  Constitutional: She appears well-developed.  HENT:  Head: Normocephalic.  Eyes: Pupils are equal, round, and reactive to light.  Neck: Normal range of motion.  Cardiovascular: Normal rate.   Respiratory: Effort normal.  GI: Soft.  Musculoskeletal: She exhibits tenderness.  Neurological:  Slight weakness of her toe extensors on the left.  Skin: Skin is warm.     Assessment/Plan Central Decompressive lumbar laminectomy at L-4-L-L-5 and Excision of Synovial Cyst at L-4-L-5 om the left.  Tobi Bastos, MD 05/07/2017, 12:18 PM

## 2017-05-08 ENCOUNTER — Encounter (HOSPITAL_COMMUNITY): Payer: Self-pay | Admitting: Orthopedic Surgery

## 2017-05-08 DIAGNOSIS — M7138 Other bursal cyst, other site: Secondary | ICD-10-CM | POA: Diagnosis not present

## 2017-05-08 MED ORDER — OXYCODONE-ACETAMINOPHEN 5-325 MG PO TABS: 1.0000 | ORAL_TABLET | ORAL | 0 refills | Status: DC | PRN

## 2017-05-08 MED ORDER — METHOCARBAMOL 500 MG PO TABS: 500.0000 mg | ORAL_TABLET | Freq: Four times a day (QID) | ORAL | 0 refills | Status: DC | PRN

## 2017-05-08 MED ORDER — POLYETHYLENE GLYCOL 3350 17 G PO PACK: 17.0000 g | PACK | Freq: Every day | ORAL | 0 refills | Status: DC | PRN

## 2017-05-08 NOTE — Evaluation (Signed)
Occupational Therapy Evaluation Patient Details Name: Lisa [name] MRN: [id number] DOB: 1961-12-20 Today's Date: 05/08/2017    History of Present Illness Pt is s/p L4-5 decompression, foraminotomies and excision of synovial cyst   Clinical Impression   This 56 year old female was admitted for the above sx. She will benefit from continued OT in acute setting.  Reviewed all education for ADLs but pt has not practiced with AE.  Only tolerated up to chair at this time.  Goals in acute are for min guard to min A level. Pt needs mod A for bed mobility, min A for SPT and up to total A for LB dressing    Follow Up Recommendations  Supervision/Assistance - 24 hour    Equipment Recommendations  3 in 1 bedside commode    Recommendations for Other Services       Precautions / Restrictions Precautions Precautions: Back Restrictions Weight Bearing Restrictions: No      Mobility Bed Mobility Overal bed mobility: Needs Assistance Bed Mobility: Rolling;Sidelying to Sit Rolling: Min assist Sidelying to sit: Mod assist       General bed mobility comments: used chux during rolling  Transfers Overall transfer level: Needs assistance Equipment used: 1 person hand held assist Transfers: Sit to/from [facility] Sit to Stand: Min assist Stand pivot transfers: Min assist       General transfer comment: pt did not want to try RW for OOB    Balance                                           ADL either performed or assessed with clinical judgement   ADL Overall ADL's : Needs assistance/impaired     Grooming: Set up;Sitting   Upper Body Bathing: Set up;Sitting   Lower Body Bathing: Maximal assistance;Sit to/from stand   Upper Body Dressing : Sitting;Minimal assistance   Lower Body Dressing: Total assistance;Sit to/from stand   Toilet Transfer: Minimal assistance;Stand-pivot (to recliner)             General ADL Comments: ADL simulated  due to pain. When I arrived, pt wanted to get OOB. Assisted her to the chair and reviewed back education while she was eating breakfast. She requested more pain medication, which the RN was getting.  Showed AE. Will return later to allow pt to try this.     Vision         Perception     Praxis      Pertinent Vitals/Pain Pain Assessment: Faces Faces Pain Scale: Hurts whole lot Pain Location: back Pain Descriptors / Indicators: Aching Pain Intervention(s): Limited activity within patient's tolerance;Monitored during session;Premedicated before session;Repositioned;Patient requesting pain meds-RN notified     Hand Dominance     Extremity/Trunk Assessment Upper Extremity Assessment Upper Extremity Assessment: Overall WFL for tasks assessed           Communication Communication Communication: No difficulties   Cognition Arousal/Alertness: Awake/alert Behavior During Therapy: WFL for tasks assessed/performed Overall Cognitive Status: Within Functional Limits for tasks assessed                                     General Comments       Exercises     Shoulder Instructions      Home Living Family/patient expects to  be discharged to:: Private residence Living Arrangements: Spouse/significant other;Alone Available Help at Discharge: Family (college age son; will have more assistance Friday)                   Bathroom Toilet: Standard         Additional Comments: pt has ordered a toilet riser, but it won't be in until tomorrow      Prior Functioning/Environment Level of Independence: Independent                 OT Problem List: Decreased strength;Decreased activity tolerance;Pain;Decreased knowledge of use of DME or AE;Decreased knowledge of precautions      OT Treatment/Interventions: Self-care/ADL training;DME and/or AE instruction;Patient/family education    OT Goals(Current goals can be found in the care plan section) Acute Rehab  OT Goals Patient Stated Goal: less pain; return to independence OT Goal Formulation: With patient Time For Goal Achievement: 05/15/17 Potential to Achieve Goals: Good ADL Goals Pt Will Perform Grooming: with supervision;standing Pt Will Perform Lower Body Bathing: with min guard assist;sit to/from stand Pt Will Perform Lower Body Dressing: with adaptive equipment;sit to/from stand;with min assist Pt Will Transfer to Toilet: with min guard assist;bedside commode;ambulating Pt Will Perform Tub/Shower Transfer: Shower transfer;Tub transfer;with min guard assist;ambulating (vs verbalize sequence) Additional ADL Goal #1: pt will perform bed mobility at min A level in preparation for adls  OT Frequency: Min 2X/week   Barriers to D/C:            Co-evaluation              AM-PAC PT "6 Clicks" Daily Activity     Outcome Measure Help from another person eating meals?: None Help from another person taking care of personal grooming?: A Little Help from another person toileting, which includes using toliet, bedpan, or urinal?: A Lot Help from another person bathing (including washing, rinsing, drying)?: A Lot Help from another person to put on and taking off regular upper body clothing?: A Little Help from another person to put on and taking off regular lower body clothing?: Total 6 Click Score: 15   End of Session Nurse Communication:  (NT in room during mobility)  Activity Tolerance: Patient limited by pain Patient left: in chair;with call bell/phone within reach  OT Visit Diagnosis: Muscle weakness (generalized) (M62.81)                Time: 3276-1470 OT Time Calculation (min): 22 min Charges:  OT General Charges $OT Visit: 1 Procedure OT Evaluation $OT Eval Low Complexity: 1 Procedure G-Codes: OT G-codes **NOT FOR INPATIENT CLASS** Functional Assessment Tool Used: Clinical judgement Functional Limitation: Self care Self Care Current Status (L2957): At least 80 percent but  less than 100 percent impaired, limited or restricted Self Care Goal Status (M7340): At least 20 percent but less than 40 percent impaired, limited or restricted   Lesle Chris, OTR/L [phone] 05/08/2017  Lisa [name] 05/08/2017, 11:40 AM

## 2017-05-08 NOTE — Addendum Note (Signed)
Addendum  created 05/08/17 0915 by Myrtie Soman, MD   Sign clinical note

## 2017-05-08 NOTE — Progress Notes (Signed)
    Durable Medical Equipment        Start     Ordered   05/08/17 1200  For home use only DME 3 n 1  Once     05/08/17 1159    [phone]

## 2017-05-08 NOTE — Progress Notes (Signed)
   05/08/17 1300  OT Visit Information  Last OT Received On 05/08/17  Assistance Needed +1  History of Present Illness Pt is s/p L4-5 decompression, foraminotomies and excision of synovial cyst  Precautions  Precautions Back  Pain Assessment  Pain Assessment Faces  Faces Pain Scale 4  Pain Location back  Pain Descriptors / Indicators Aching  Pain Intervention(s) Limited activity within patient's tolerance;Monitored during session;Premedicated before session;Repositioned  Cognition  Arousal/Alertness Awake/alert  Behavior During Therapy WFL for tasks assessed/performed  Overall Cognitive Status Within Functional Limits for tasks assessed  ADL  General ADL Comments pt was already dressed.  Did not want to practice with AE. Reviewed toilet aide options. Pt feels comfortable with shower transfer.  Pt verbalizes all education.  3:1 delivered to room; educated to have family wipe legs off after use to avoid rusting  Bed Mobility  General bed mobility comments oob; verbalizes comfort with bed mobility  Restrictions  Weight Bearing Restrictions No  Transfers  Sit to Stand Min guard  General transfer comment for safety  OT - End of Session  Activity Tolerance Patient tolerated treatment well  Patient left in chair;with call bell/phone within reach;with family/visitor present  OT Assessment/Plan  Follow Up Recommendations Supervision/Assistance - 24 hour  OT Equipment 3 in 1 bedside commode  AM-PAC OT "6 Clicks" Daily Activity Outcome Measure  Help from another person eating meals? 4  Help from another person taking care of personal grooming? 3  Help from another person toileting, which includes using toliet, bedpan, or urinal? 3  Help from another person bathing (including washing, rinsing, drying)? 2  Help from another person to put on and taking off regular upper body clothing? 3  Help from another person to put on and taking off regular lower body clothing? 2  6 Click Score 17  ADL  G Code Conversion CK  OT Goal Progression  Progress towards OT goals Progressing toward goals (will not need further OT)  OT Time Calculation  OT Start Time (ACUTE ONLY) 1322  OT Stop Time (ACUTE ONLY) 1332  OT Time Calculation (min) 10 min  OT G-codes **NOT FOR INPATIENT CLASS**  Self Care Discharge Status (C0034) CK  OT General Charges  $OT Visit 1 Procedure  OT Treatments  $Self Care/Home Management  8-22 mins  Lesle Chris, OTR/L [phone] 05/08/2017

## 2017-05-08 NOTE — Evaluation (Signed)
Physical Therapy Evaluation Patient Details Name: Lisa [name] MRN: [id number] DOB: [birth year] Today's Date: 05/08/2017   History of Present Illness  Pt is s/p L4-5 decompression, foraminotomies and excision of synovial cyst  Clinical Impression  Patient evaluated by Physical Therapy with no further acute PT needs identified. All education has been completed and the patient has no further questions.  See below for any follow-up Physical Therapy or equipment needs. PT is signing off. Thank you for this referral. Reviewed back precautions and log rolling with pt; pt verbalizes 2/3 without prompting; feels more comfortable with bed mobility and log roll techniques after further instruction and practice; discussed using on e step to get in/out of high bed at home and pt verbalizes understanding     Follow Up Recommendations  (eventual OPPT per MD rec's)    Equipment Recommendations  None recommended by PT    Recommendations for Other Services       Precautions / Restrictions Precautions Precautions: Back Restrictions Weight Bearing Restrictions: No      Mobility  Bed Mobility Overal bed mobility: Needs Assistance Bed Mobility: Rolling;Sidelying to Sit;Sit to Sidelying Rolling: Min guard Sidelying to sit: Min guard     Sit to sidelying: Min guard General bed mobility comments: repeated x2 to right and left side  Transfers Overall transfer level: Needs assistance Equipment used: None Transfers: Sit to/from Stand Sit to Stand: Min guard Stand pivot transfers: Min assist       General transfer comment: for safety  Ambulation/Gait Ambulation/Gait assistance: Min guard;Supervision Ambulation Distance (Feet): 180 Feet Assistive device: None Gait Pattern/deviations: Step-through pattern;Decreased stride length     General Gait Details: decr arm swing, guarded gait initially d/t back pain  Stairs            Wheelchair Mobility    Modified Rankin (Stroke  Patients Only)       Balance                                             Pertinent Vitals/Pain Pain Assessment: Faces Faces Pain Scale: Hurts little more Pain Location: back Pain Descriptors / Indicators: Sore Pain Intervention(s): Limited activity within patient's tolerance;Monitored during session;Premedicated before session;Repositioned    Home Living Family/patient expects to be discharged to:: Private residence Living Arrangements: Spouse/significant other;Alone Available Help at Discharge: Family Type of Home: House       Home Layout: One level Home Equipment: None Additional Comments: pt has ordered a toilet riser, but it won't be in until tomorrow    Prior Function Level of Independence: Independent               Hand Dominance        Extremity/Trunk Assessment   Upper Extremity Assessment Upper Extremity Assessment: Defer to OT evaluation;Overall [facility] for tasks assessed    Lower Extremity Assessment Lower Extremity Assessment: Overall WFL for tasks assessed       Communication   Communication: No difficulties  Cognition Arousal/Alertness: Awake/alert Behavior During Therapy: WFL for tasks assessed/performed Overall Cognitive Status: Within Functional Limits for tasks assessed                                        General Comments      Exercises  Assessment/Plan    PT Assessment All further PT needs can be met in the next venue of care (eventual OPPT per MD)  PT Problem List         PT Treatment Interventions      PT Goals (Current goals can be found in the Care Plan section)  Acute Rehab PT Goals Patient Stated Goal: less pain; return to independence PT Goal Formulation: All assessment and education complete, DC therapy    Frequency     Barriers to discharge        Co-evaluation               AM-PAC PT "6 Clicks" Daily Activity  Outcome Measure Difficulty turning over in bed  (including adjusting bedclothes, sheets and blankets)?: A Little Difficulty moving from lying on back to sitting on the side of the bed? : A Little Difficulty sitting down on and standing up from a chair with arms (e.g., wheelchair, bedside commode, etc,.)?: A Little Help needed moving to and from a bed to chair (including a wheelchair)?: A Little Help needed walking in hospital room?: A Little Help needed climbing 3-5 steps with a railing? : A Little 6 Click Score: 18    End of Session   Activity Tolerance: Patient tolerated treatment well Patient left: with call bell/phone within reach;in bed   PT Visit Diagnosis: Pain Pain - Right/Left:  (both) Pain - part of body:  (back)    Time: 1028-1100 PT Time Calculation (min) (ACUTE ONLY): 32 min   Charges:   PT Evaluation $PT Eval Low Complexity: 1 Procedure PT Treatments $Gait Training: 8-22 mins   PT G Codes:   PT G-Codes **NOT FOR INPATIENT CLASS** Functional Assessment Tool Used: AM-PAC 6 Clicks Basic Mobility;Clinical judgement Functional Limitation: Mobility: Walking and moving around Mobility: Walking and Moving Around Current Status (Z1281): At least 1 percent but less than 20 percent impaired, limited or restricted Mobility: Walking and Moving Around Goal Status [phone]): At least 1 percent but less than 20 percent impaired, limited or restricted Mobility: Walking and Moving Around Discharge Status [phone]): At least 1 percent but less than 20 percent impaired, limited or restricted      [facility] 05/08/2017, 2:27 PM

## 2017-05-08 NOTE — Discharge Instructions (Signed)
For the first three days, remove your dressing, tape a piece of saran wrap over your incision Take your shower, then remove the saran wrap and put a clean dressing on. After three days you can shower without the saran wrap.  No lifting or bending. No driving while taking pain medications. Call Dr. Gladstone Lighter if any wound complications or temperature of 101 degrees F or over.  Call the office for an appointment to see Dr. Gladstone Lighter in two weeks: [phone].

## 2017-05-08 NOTE — Anesthesia Postprocedure Evaluation (Signed)
Anesthesia Post Note  Patient: Lisa [name]  Procedure(s) Performed: Procedure(s) (LRB): Decompression L4-L5 and excision of synovial cyst Left (Left)  Anesthesia Type: General       Last Vitals:  Vitals:   05/08/17 0134 05/08/17 0634  BP: 120/68 (!) 97/54  Pulse: 70 77  Resp: 16 16  Temp: 37.3 C 37.1 C    Last Pain:  Vitals:   05/08/17 0858  TempSrc:   PainSc: 10-Worst pain ever                 Tiffanee Mcnee S

## 2017-05-08 NOTE — Progress Notes (Signed)
CSW consulted for SNF placement. Nsg reports that pt is planning to return home at d/c. [facility] will assist with d/c planning needs. CSW will remain available in case SNF is required.  Werner Lean LCSW [phone]

## 2017-05-13 ENCOUNTER — Ambulatory Visit: Payer: BC Managed Care – PPO | Admitting: Family Medicine

## 2017-05-13 ENCOUNTER — Other Ambulatory Visit: Payer: BC Managed Care – PPO

## 2017-05-13 NOTE — Discharge Summary (Signed)
Physician Discharge Summary   Patient ID: Lisa [name] MRN: [id number] DOB/AGE: 06/18/1961 56 y.o.  Admit date: 05/07/2017 Discharge date: 05/08/2017  Primary Diagnosis: Lumbar spinal stenosis    Admission Diagnoses:  Past Medical History:  Diagnosis Date  . Carpal tunnel syndrome of right wrist   . Elevated cholesterol   . Family history of adverse reaction to anesthesia    sister has naseau  . Hypertension   . Increased risk of breast cancer 2018   27.7 lifetime risk.  Yearly MRI of breast.   . Low back pain   . OAB (overactive bladder)   . Pneumonia   . Seizures ([place])    Discharge Diagnoses:   Active Problems:   Spinal stenosis, lumbar region with neurogenic claudication  Estimated body mass index is 30.73 kg/m as calculated from the following:   Height as of this encounter: 5' 2"  (1.575 m).   Weight as of this encounter: 76.2 kg (168 lb).  Procedure:  Procedure(s) (LRB): Decompression L4-L5 and excision of synovial cyst Left (Left)   Consults: None  HPI: The patient presented with the chief complaint of low back pain with progressively worsening pain in the left leg. She developed weakness in the left leg as well. She was unresponsive to conservative treatments. MRI of the lumbar spine showed a disc herniation and synovial cyst at L4-L5 left.    Laboratory Data: Hospital Outpatient Visit on 05/06/2017  Component Date Value Ref Range Status  . aPTT 05/06/2017 40* 24 - 36 seconds Final   Comment:        IF BASELINE aPTT IS ELEVATED, SUGGEST PATIENT RISK ASSESSMENT BE USED TO DETERMINE APPROPRIATE ANTICOAGULANT THERAPY.   . WBC 05/06/2017 6.8  4.0 - 10.5 K/uL Final  . RBC 05/06/2017 4.48  3.87 - 5.11 MIL/uL Final  . Hemoglobin 05/06/2017 13.1  12.0 - 15.0 g/dL Final  . HCT 05/06/2017 38.7  36.0 - 46.0 % Final  . MCV 05/06/2017 86.4  78.0 - 100.0 fL Final  . MCH 05/06/2017 29.2  26.0 - 34.0 pg Final  . MCHC 05/06/2017 33.9  30.0 - 36.0 g/dL Final  . RDW  05/06/2017 15.3  11.5 - 15.5 % Final  . Platelets 05/06/2017 301  150 - 400 K/uL Final  . Neutrophils Relative % 05/06/2017 47  % Final  . Neutro Abs 05/06/2017 3.3  1.7 - 7.7 K/uL Final  . Lymphocytes Relative 05/06/2017 43  % Final  . Lymphs Abs 05/06/2017 2.9  0.7 - 4.0 K/uL Final  . Monocytes Relative 05/06/2017 9  % Final  . Monocytes Absolute 05/06/2017 0.6  0.1 - 1.0 K/uL Final  . Eosinophils Relative 05/06/2017 1  % Final  . Eosinophils Absolute 05/06/2017 0.1  0.0 - 0.7 K/uL Final  . Basophils Relative 05/06/2017 0  % Final  . Basophils Absolute 05/06/2017 0.0  0.0 - 0.1 K/uL Final  . Sodium 05/06/2017 137  135 - 145 mmol/L Final  . Potassium 05/06/2017 3.3* 3.5 - 5.1 mmol/L Final  . Chloride 05/06/2017 96* 101 - 111 mmol/L Final  . CO2 05/06/2017 28  22 - 32 mmol/L Final  . Glucose, Bld 05/06/2017 94  65 - 99 mg/dL Final  . BUN 05/06/2017 19  6 - 20 mg/dL Final  . Creatinine, Ser 05/06/2017 0.97  0.44 - 1.00 mg/dL Final  . Calcium 05/06/2017 8.9  8.9 - 10.3 mg/dL Final  . Total Protein 05/06/2017 7.8  6.5 - 8.1 g/dL Final  . Albumin 05/06/2017 4.3  3.5 - 5.0 g/dL Final  . AST 05/06/2017 43* 15 - 41 U/L Final  . ALT 05/06/2017 77* 14 - 54 U/L Final  . Alkaline Phosphatase 05/06/2017 77  38 - 126 U/L Final  . Total Bilirubin 05/06/2017 1.0  0.3 - 1.2 mg/dL Final  . GFR calc non Af Amer 05/06/2017 >60  >60 mL/min Final  . GFR calc Af Amer 05/06/2017 >60  >60 mL/min Final   Comment: (NOTE) The eGFR has been calculated using the CKD EPI equation. This calculation has not been validated in all clinical situations. eGFR's persistently <60 mL/min signify possible Chronic Kidney Disease.   . Anion gap 05/06/2017 13  5 - 15 Final  . Prothrombin Time 05/06/2017 12.3  11.4 - 15.2 seconds Final  . INR 05/06/2017 0.92   Final  . MRSA, PCR 05/06/2017 NEGATIVE  NEGATIVE Final  . Staphylococcus aureus 05/06/2017 NEGATIVE  NEGATIVE Final   Comment:        The Xpert SA Assay  (FDA approved for NASAL specimens in patients over 20 years of age), is one component of a comprehensive surveillance program.  Test performance has been validated by [facility] for patients greater than or equal to 70 year old. It is not intended to diagnose infection nor to guide or monitor treatment.      X-Rays:Dg Chest 2 View  Result Date: 05/06/2017 CLINICAL DATA:  Chronic low back pain. Patient for lumbar surgery tomorrow. EXAM: CHEST  2 VIEW COMPARISON:  PA and lateral chest 05/01/2016. FINDINGS: Lungs are clear. Heart size is normal. No pneumothorax or pleural effusion. No acute bony abnormality. IMPRESSION: No acute disease. Electronically Signed   By: Inge Rise M.D.   On: 05/06/2017 17:11   Dg Lumbar Spine 2-3 Views  Result Date: 05/06/2017 CLINICAL DATA:  Low back pain.  Preop spine surgery. EXAM: LUMBAR SPINE - 2-3 VIEW COMPARISON:  None. FINDINGS: There are 5 nonrib bearing lumbar-type vertebral bodies. The vertebral body heights are maintained. There is grade 1 anterolisthesis of L4 on L5 secondary to facet disease. There is no spondylolysis. There is no acute fracture. The disc spaces are maintained. There is bilateral facet disease at L4-5 and L5-S1. The SI joints are unremarkable. IMPRESSION: Grade 1 anterolisthesis of L4 on L5 secondary to facet disease. Electronically Signed   By: Kathreen Devoid   On: 05/06/2017 17:14   Dg Spine Portable 1 View  Result Date: 05/07/2017 CLINICAL DATA:  Intraoperative localization for spine surgery. EXAM: PORTABLE SPINE - 1 VIEW COMPARISON:  Earlier films, same date. FINDINGS: There are surgical retractors noted at L4-5 and L5-S1 with a surgical instrument marking the L4-5 disc space level. IMPRESSION: L4-5 marked intraoperatively. Electronically Signed   By: Marijo Sanes M.D.   On: 05/07/2017 13:51   Dg Spine Portable 1 View  Result Date: 05/07/2017 CLINICAL DATA:  Intraoperative film labed #2 EXAM: PORTABLE SPINE - 1 VIEW  COMPARISON:  Lumbar spine series of May 06, 2017 and intraoperative lumbar spine film #1 of today's date FINDINGS: The uppermost surgical instrument projects over the posterior aspect of the L3 spinous process. The lower instrument tip projects over the posterior aspect of the L4 spinous process. IMPRESSION: Intraoperative radiograph#2 with findings as described. Electronically Signed   By: David  Martinique M.D.   On: 05/07/2017 13:25   Dg Spine Portable 1 View  Result Date: 05/07/2017 CLINICAL DATA:  Lumbar decompression planned for L4-L5. Intraoperative film 1. EXAM: PORTABLE SPINE - 1 VIEW COMPARISON:  Lumbar  spine series of May 06, 2017 FINDINGS: The numbering scheme is in accordance with the May 7th study. The uppermost metallic needle lies just posterior to the tip of the L4 spinous process. This is approximately 6 cm posterior to the posterior aspect of the L4-5 disc space. The lower most needle projects over the posterior aspect of the L5 spinous process. Grade 1 anterolisthesis of L4 with respect L5 remains. IMPRESSION: Positioning of the metallic marker needles is as described. Electronically Signed   By: David  Martinique M.D.   On: 05/07/2017 13:12    EKG: Orders placed or performed during the hospital encounter of 05/06/17  . EKG  . EKG     Hospital Course: Lisa [name] is a 56 y.o. who was admitted to [facility]. They were brought to the operating room on 05/07/2017 and underwent Procedure(s): Decompression L4-L5 and excision of synovial cyst Left.  Patient tolerated the procedure well and was later transferred to the recovery room and then to the orthopaedic floor for postoperative care.  They were given PO and IV analgesics for pain control following their surgery.  They were given 24 hours of postoperative antibiotics of  Anti-infectives    Start     Dose/Rate Route Frequency Ordered Stop   05/07/17 1800  ceFAZolin (ANCEF) IVPB 1 g/50 mL premix     1 g 100 mL/hr over 30 Minutes  Intravenous Every 8 hours 05/07/17 1637 05/08/17 1127   05/07/17 1313  polymyxin B 500,000 Units, bacitracin 50,000 Units in sodium chloride irrigation 0.9 % 500 mL irrigation  Status:  Discontinued       As needed 05/07/17 1314 05/07/17 1438   05/07/17 1040  ceFAZolin (ANCEF) IVPB 2g/100 mL premix     2 g 200 mL/hr over 30 Minutes Intravenous On call to O.R. 05/07/17 1040 05/07/17 1235     and started on DVT prophylaxis in the form of Aspirin.   PT was ordered. Discharge planning consulted to help with postop disposition and equipment needs.  Patient had a fair night on the evening of surgery.  She initially had some issues with nausea but did see resolution with phenergan. They started to get up OOB with therapy on day one.  Dressing was changed and the incision was clean and dry. Patient was seen in rounds and was ready to go home.   Diet: Cardiac diet Activity:WBAT Follow-up:in 2 weeks Disposition - Home Discharged Condition: stable   Discharge Instructions    Call MD / Call 911    Complete by:  As directed    If you experience chest pain or shortness of breath, CALL 911 and be transported to the hospital emergency room.  If you develope a fever above 101 F, pus (white drainage) or increased drainage or redness at the wound, or calf pain, call your surgeon's office.   Constipation Prevention    Complete by:  As directed    Drink plenty of fluids.  Prune juice may be helpful.  You may use a stool softener, such as Colace (over the counter) 100 mg twice a day.  Use MiraLax (over the counter) for constipation as needed.   Diet - low sodium heart healthy    Complete by:  As directed    Discharge instructions    Complete by:  As directed    For the first three days, remove your dressing, tape a piece of saran wrap over your incision Take your shower, then remove the saran wrap and  put a clean dressing on. After three days you can shower without the saran wrap.  No lifting or  bending. No driving while taking pain medications. Call Dr. Gladstone Lighter if any wound complications or temperature of 101 degrees F or over.  Call the office for an appointment to see Dr. Gladstone Lighter in two weeks: [phone].   Increase activity slowly as tolerated    Complete by:  As directed      Allergies as of 05/08/2017   No Known Allergies     Medication List    STOP taking these medications   atorvastatin 20 MG tablet Commonly known as:  LIPITOR   cyclobenzaprine 10 MG tablet Commonly known as:  FLEXERIL   HYDROcodone-acetaminophen 5-325 MG tablet Commonly known as:  NORCO   methylPREDNISolone 4 MG Tbpk tablet Commonly known as:  MEDROL DOSEPAK   predniSONE 10 MG tablet Commonly known as:  DELTASONE   vitamin B-12 100 MCG tablet Commonly known as:  CYANOCOBALAMIN     TAKE these medications   cetirizine 10 MG tablet Commonly known as:  ZYRTEC Take 1 tablet (10 mg total) by mouth daily. What changed:  when to take this  reasons to take this   ciprofloxacin 0.3 % ophthalmic solution Commonly known as:  CILOXAN Place 2 drops into both eyes 2 (two) times daily.   fluticasone 50 MCG/ACT nasal spray Commonly known as:  FLONASE Place 2 sprays into both nostrils daily. What changed:  when to take this   hydrochlorothiazide 25 MG tablet Commonly known as:  HYDRODIURIL TAKE 1 TABLET(25 MG) BY MOUTH DAILY   ibuprofen 200 MG tablet Commonly known as:  ADVIL,MOTRIN Take 200 mg by mouth 2 (two) times daily as needed for mild pain. What changed:  Another medication with the same name was removed. Continue taking this medication, and follow the directions you see here.   lidocaine 5 % Commonly known as:  LIDODERM Place 1 patch onto the skin daily. Remove & Discard patch within 12 hours or as directed by MD   methocarbamol 500 MG tablet Commonly known as:  ROBAXIN Take 1 tablet (500 mg total) by mouth every 6 (six) hours as needed for muscle spasms.   OVER THE  COUNTER MEDICATION Take 1 tablet by mouth 3 (three) times daily. CONNEXIN MULTIVITAMIN   oxyCODONE-acetaminophen 5-325 MG tablet Commonly known as:  PERCOCET/ROXICET Take 1-2 tablets by mouth every 4 (four) hours as needed for moderate pain.   phenytoin 100 MG ER capsule Commonly known as:  DILANTIN Take 2 capsules (200 mg total) by mouth every evening.   polyethylene glycol packet Commonly known as:  MIRALAX / GLYCOLAX Take 17 g by mouth daily as needed for mild constipation.   Vitamin D3 2000 units capsule TAKE 1 CAPSULE BY MOUTH DAILY What changed:  See the new instructions.      Follow-up Information    Latanya Maudlin, MD Follow up in 2 week(s).   Specialty:  Orthopedic Surgery Contact information: [address] [place] [address] [phone]        [facility] - Dme Follow up.   Why:  3n1/bedside commode Contact information: [place] [address] [phone]           Signed: Ardeen Jourdain, PA-C Orthopaedic Surgery 05/13/2017, 10:27 AM

## 2017-05-20 ENCOUNTER — Ambulatory Visit: Payer: BC Managed Care – PPO | Admitting: Family Medicine

## 2017-05-30 ENCOUNTER — Other Ambulatory Visit: Payer: Self-pay | Admitting: *Deleted

## 2017-05-30 DIAGNOSIS — Z79899 Other long term (current) drug therapy: Secondary | ICD-10-CM

## 2017-05-30 DIAGNOSIS — E538 Deficiency of other specified B group vitamins: Secondary | ICD-10-CM

## 2017-05-30 DIAGNOSIS — I10 Essential (primary) hypertension: Secondary | ICD-10-CM

## 2017-05-30 DIAGNOSIS — E785 Hyperlipidemia, unspecified: Secondary | ICD-10-CM

## 2017-05-30 DIAGNOSIS — E559 Vitamin D deficiency, unspecified: Secondary | ICD-10-CM

## 2017-06-03 ENCOUNTER — Ambulatory Visit: Payer: BC Managed Care – PPO

## 2017-06-03 DIAGNOSIS — E559 Vitamin D deficiency, unspecified: Secondary | ICD-10-CM

## 2017-06-03 DIAGNOSIS — Z79899 Other long term (current) drug therapy: Secondary | ICD-10-CM

## 2017-06-03 DIAGNOSIS — E785 Hyperlipidemia, unspecified: Secondary | ICD-10-CM

## 2017-06-03 DIAGNOSIS — I10 Essential (primary) hypertension: Secondary | ICD-10-CM

## 2017-06-03 DIAGNOSIS — E538 Deficiency of other specified B group vitamins: Secondary | ICD-10-CM

## 2017-06-04 LAB — COMPLETE METABOLIC PANEL WITH GFR
ALT: 12 U/L (ref 6–29)
AST: 15 U/L (ref 10–35)
Albumin: 4.3 g/dL (ref 3.6–5.1)
Alkaline Phosphatase: 95 U/L (ref 33–130)
BUN: 13 mg/dL (ref 7–25)
CHLORIDE: 99 mmol/L (ref 98–110)
CO2: 24 mmol/L (ref 20–31)
Calcium: 9.4 mg/dL (ref 8.6–10.4)
Creat: 0.77 mg/dL (ref 0.50–1.05)
GFR, Est Non African American: 88 mL/min (ref >=60)
GLUCOSE: 91 mg/dL (ref 70–99)
Potassium: 3.7 mmol/L (ref 3.5–5.3)
SODIUM: 141 mmol/L (ref 135–146)
TOTAL PROTEIN: 7 g/dL (ref 6.1–8.1)
Total Bilirubin: 0.4 mg/dL (ref 0.2–1.2)

## 2017-06-04 LAB — CBC WITH DIFFERENTIAL/PLATELET
Basophils Absolute: 0 cells/uL (ref 0–200)
Basophils Relative: 0 %
EOS ABS: 164 {cells}/uL (ref 15–500)
EOS PCT: 4 %
HCT: 39.7 % (ref 35.0–45.0)
Hemoglobin: 12.8 g/dL (ref 12.0–15.0)
LYMPHS PCT: 41 %
Lymphs Abs: 1681 cells/uL (ref 850–3900)
MCH: 29.4 pg (ref 27.0–33.0)
MCHC: 32.2 g/dL (ref 32.0–36.0)
MCV: 91.3 fL (ref 80.0–100.0)
MONOS PCT: 7 %
MPV: 9.7 fL (ref 7.5–12.5)
Monocytes Absolute: 287 cells/uL (ref 200–950)
NEUTROS ABS: 1968 {cells}/uL (ref 1500–7800)
Neutrophils Relative %: 48 %
PLATELETS: 299 10*3/uL (ref 140–400)
RBC: 4.35 MIL/uL (ref 3.80–5.10)
RDW: 15.7 % — AB (ref 11.0–15.0)
WBC: 4.1 10*3/uL (ref 3.8–10.8)

## 2017-06-04 LAB — LIPID PANEL
Cholesterol: 348 mg/dL — ABNORMAL HIGH (ref <200)
HDL: 65 mg/dL (ref >50)
LDL CALC: 250 mg/dL — AB (ref <100)
TRIGLYCERIDES: 164 mg/dL — AB (ref <150)
Total CHOL/HDL Ratio: 5.4 Ratio — ABNORMAL HIGH (ref <5.0)
VLDL: 33 mg/dL — AB (ref <30)

## 2017-06-04 LAB — VITAMIN B12: Vitamin B-12: 440 pg/mL (ref 200–1100)

## 2017-06-04 LAB — VITAMIN D 25 HYDROXY (VIT D DEFICIENCY, FRACTURES): Vit D, 25-Hydroxy: 26 ng/mL — ABNORMAL LOW (ref 30–100)

## 2017-06-06 LAB — PHENYTOIN LEVEL, FREE AND TOTAL

## 2017-06-07 ENCOUNTER — Ambulatory Visit (INDEPENDENT_AMBULATORY_CARE_PROVIDER_SITE_OTHER): Payer: BC Managed Care – PPO | Admitting: Family Medicine

## 2017-06-07 ENCOUNTER — Encounter: Payer: Self-pay | Admitting: Family Medicine

## 2017-06-07 VITALS — BP 128/74 | HR 72 | Temp 98.2°F | Resp 16 | Ht 61.5 in | Wt 168.0 lb

## 2017-06-07 DIAGNOSIS — E785 Hyperlipidemia, unspecified: Secondary | ICD-10-CM | POA: Diagnosis not present

## 2017-06-07 DIAGNOSIS — E559 Vitamin D deficiency, unspecified: Secondary | ICD-10-CM | POA: Insufficient documentation

## 2017-06-07 DIAGNOSIS — Z6833 Body mass index (BMI) 33.0-33.9, adult: Secondary | ICD-10-CM

## 2017-06-07 DIAGNOSIS — G40909 Epilepsy, unspecified, not intractable, without status epilepticus: Secondary | ICD-10-CM

## 2017-06-07 DIAGNOSIS — I10 Essential (primary) hypertension: Secondary | ICD-10-CM | POA: Diagnosis not present

## 2017-06-07 DIAGNOSIS — E6609 Other obesity due to excess calories: Secondary | ICD-10-CM

## 2017-06-07 MED ORDER — ATORVASTATIN CALCIUM 20 MG PO TABS: 20.0000 mg | ORAL_TABLET | Freq: Every day | ORAL | 1 refills | Status: DC

## 2017-06-07 NOTE — Progress Notes (Signed)
   Subjective:    Patient ID: Lisa [name], female    DOB: 05-27-1961, 56 y.o.   MRN: [id number]  Patient presents for 6 month F/U (has had labs)  S/P decompression and synovial cyst removal, 5/8, now in therapy 2x /week- [facility]   Pain much improved since surgery. Currently on robaxin, has not required pain medication in past few weeks, taking ibuprofen 800mg  daily, takes tylenol for other breakthrough pain.  Occasionally using lidocaine patches    Seizure prophlaxis on dilantin , last seizure 10+ years ago  Hyperlipidemia- in past on lipitor, she stopped to see if she could control with diet, has been on ketogenic diet for past 6 months, labs showed significantly elevated cholesterol 348 LDL 250.  She has maintained 22lbs of weight loss      Review Of Systems:  GEN- denies fatigue, fever, weight loss,weakness, recent illness HEENT- denies eye drainage, change in vision, nasal discharge, CVS- denies chest pain, palpitations RESP- denies SOB, cough, wheeze ABD- denies N/V, change in stools, abd pain GU- denies dysuria, hematuria, dribbling, incontinence MSK- + joint pain, muscle aches, injury Neuro- denies headache, dizziness, syncope, seizure activity       Objective:    BP 128/74   Pulse 72   Temp 98.2 F (36.8 C) (Oral)   Resp 16   Ht 5' 1.5" (1.562 m)   Wt 168 lb (76.2 kg)   LMP 12/31/2004 (Approximate)   SpO2 99%   BMI 31.23 kg/m  GEN- NAD, alert and oriented x3 HEENT- PERRL, EOMI, non injected sclera, pink conjunctiva, MMM, oropharynx clear CVS- RRR, no murmur RESP-CTAB EXT- No edema Pulses- Radial, DP- 2+        Assessment & Plan:      Problem List Items Addressed This Visit    Vitamin D deficiency    Advised to increase vitamin D, Take at least twice a week 2000IU      Seizure disorder (HCC)    No seizure in greater than 10 years But maintains on dilantin for prophylaxsis  So will not adjust levels      Obese   Hyperlipidemia - Primary    Her Cholesterol is quite high, but also on a High Fat diet which does obscur her levels, Her Cardiovascular risk calculated at 6.7% which is more intermediate zone, though she does have HTN so has some risk for CAD in the future. She wants to continue the current diet as she has been able to keep the weight off. Reminded to stick with the healthier fats and watch the red meats/bacon consumption.  After further discussion she wants to go back on her lipitor 20mg  and we will recheck labs in 3 months       Relevant Medications   atorvastatin (LIPITOR) 20 MG tablet   Essential hypertension    Well controlled       Relevant Medications   atorvastatin (LIPITOR) 20 MG tablet      Note: This dictation was prepared with Dragon dictation along with smaller phrase technology. Any transcriptional errors that result from this process are unintentional.

## 2017-06-07 NOTE — Patient Instructions (Signed)
Labs visit in 3 months F/U 6 months

## 2017-06-09 NOTE — Assessment & Plan Note (Signed)
Advised to increase vitamin D, Take at least twice a week 2000IU

## 2017-06-09 NOTE — Assessment & Plan Note (Signed)
No seizure in greater than 10 years But maintains on dilantin for prophylaxsis  So will not adjust levels

## 2017-06-09 NOTE — Assessment & Plan Note (Signed)
Her Cholesterol is quite high, but also on a High Fat diet which does obscur her levels, Her Cardiovascular risk calculated at 6.7% which is more intermediate zone, though she does have HTN so has some risk for CAD in the future. She wants to continue the current diet as she has been able to keep the weight off. Reminded to stick with the healthier fats and watch the red meats/bacon consumption.  After further discussion she wants to go back on her lipitor 20mg  and we will recheck labs in 3 months

## 2017-06-09 NOTE — Assessment & Plan Note (Signed)
Well controlled 

## 2017-07-05 ENCOUNTER — Other Ambulatory Visit: Payer: Self-pay | Admitting: *Deleted

## 2017-07-05 MED ORDER — FLUTICASONE PROPIONATE 50 MCG/ACT NA SUSP: 2.0000 | Freq: Every day | NASAL | 3 refills | Status: DC

## 2017-07-05 MED ORDER — HYDROCHLOROTHIAZIDE 25 MG PO TABS: ORAL_TABLET | ORAL | 3 refills | Status: DC

## 2017-07-15 ENCOUNTER — Telehealth: Payer: Self-pay | Admitting: Certified Nurse Midwife

## 2017-07-15 NOTE — Telephone Encounter (Signed)
Patient came in today for a visit with Melvia Heaps, CNM for "vaginal itching" but her appointment was actually for tomorrow. Patient declined to reschedule. Appointment cancelled for 07/16/17. Patient aware to call back and reschedule as time permits.

## 2017-07-15 NOTE — Telephone Encounter (Signed)
Patie

## 2017-07-16 ENCOUNTER — Ambulatory Visit: Payer: BC Managed Care – PPO | Admitting: Certified Nurse Midwife

## 2017-07-24 ENCOUNTER — Ambulatory Visit (INDEPENDENT_AMBULATORY_CARE_PROVIDER_SITE_OTHER): Payer: BC Managed Care – PPO | Admitting: Obstetrics and Gynecology

## 2017-07-24 ENCOUNTER — Telehealth: Payer: Self-pay | Admitting: Obstetrics and Gynecology

## 2017-07-24 ENCOUNTER — Encounter: Payer: Self-pay | Admitting: Obstetrics and Gynecology

## 2017-07-24 VITALS — BP 112/64 | HR 76 | Resp 16 | Ht 61.5 in | Wt 171.0 lb

## 2017-07-24 DIAGNOSIS — N9089 Other specified noninflammatory disorders of vulva and perineum: Secondary | ICD-10-CM | POA: Diagnosis not present

## 2017-07-24 DIAGNOSIS — Z113 Encounter for screening for infections with a predominantly sexual mode of transmission: Secondary | ICD-10-CM

## 2017-07-24 DIAGNOSIS — Z803 Family history of malignant neoplasm of breast: Secondary | ICD-10-CM

## 2017-07-24 LAB — POCT URINALYSIS DIPSTICK
Bilirubin, UA: NEGATIVE
Glucose, UA: NEGATIVE
KETONES UA: NEGATIVE
Leukocytes, UA: NEGATIVE
Nitrite, UA: NEGATIVE
PH UA: 5 (ref 5.0–8.0)
PROTEIN UA: NEGATIVE
UROBILINOGEN UA: 0.2 U/dL

## 2017-07-24 NOTE — Patient Instructions (Signed)

## 2017-07-24 NOTE — Telephone Encounter (Signed)
Patient states she has a vaginal infection and wants to speak with the nurse.

## 2017-07-24 NOTE — Addendum Note (Signed)
Addended by: Polly Cobia on: 07/24/2017 02:16 PM   Modules accepted: Orders

## 2017-07-24 NOTE — Telephone Encounter (Signed)
Patient states she has a vaginal infection and would like to speak with the nurse

## 2017-07-24 NOTE — Telephone Encounter (Signed)
Spoke with patient. Reports vaginal burning that started end of June. Patient states she has had vaginal infections in the past and feels like that now. Denies vaginal itching, odor, discharge, pain or bleeding. Recommended OV for further evaluation. Patient scheduled with Dr. Quincy Simmonds today at 12:45pm. Patient is agreeable to date and time.  Routing to provider for final review. Patient is agreeable to disposition. Will close encounter.

## 2017-07-24 NOTE — Progress Notes (Signed)
GYNECOLOGY  VISIT   HPI: 56 y.o.   Widowed  [place] American  female   [phone] with Patient's last menstrual period was 12/31/2004 (approximate).   here for vaginal irritation.  She reports vaginal itching after intercourse.  Feels pressure.  States she has recurrent infections. Usually has bacterial vaginosis.   Water based lubricants are not helpful.    Had abx with back surgery in May.  Has benign cyst removed.   No wet bathing suits or working.   Choosing to not treat with vaginal estrogens due to sister and mother with breast cancer.  Patient did not have her genetic testing done.   Urine - trace RBCs.   GYNECOLOGIC HISTORY: Patient's last menstrual period was 12/31/2004 (approximate). Contraception:  Hysterectomy Menopausal hormone therapy:  none Last mammogram:  11-20-16 Density A/Neg/BiRads1:TBC. Bil.MRI of breasts 01-09-17 negative for malignancy/BiRads1/screening yearly:[facility] Last pap smear:    01-04-16 Neg:Neg HR HPV        OB History    Gravida Para Term Preterm AB Living   4 3     1 3    SAB TAB Ectopic Multiple Live Births       1   3         Patient Active Problem List   Diagnosis Date Noted  . Vitamin D deficiency 06/07/2017  . Spinal stenosis, lumbar region with neurogenic claudication 05/07/2017  . Allergic rhinitis 11/09/2015  . Essential hypertension 01/09/2015  . Grief reaction 01/09/2015  . Anxiety state 01/09/2015  . Adjustment disorder with mixed anxiety and depressed mood 01/09/2015  . Lumbago 01/09/2015  . B12 deficiency 05/10/2013  . Hyperlipidemia 09/15/2012  . Obese 05/22/2012  . Sciatica 05/22/2012  . Family history of breast cancer 05/01/2012  . 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 Seizure disorder ([place]) 01/21/2012    Past Medical History:  Diagnosis Date  . Carpal tunnel syndrome of right wrist   . Elevated cholesterol   . Family history of adverse reaction to anesthesia    sister has naseau  .  Hypertension   . Increased risk of breast cancer 2018   27.7 lifetime risk.  Yearly MRI of breast.   . Low back pain   . OAB (overactive bladder)   . Pneumonia   . Seizures ([place])     Past Surgical History:  Procedure Laterality Date  . ABDOMINAL HYSTERECTOMY  06/23/2004   supracervical hysterectomy.  Ovaries remain.  Marland Kitchen CESAREAN SECTION    . ECTOPIC PREGNANCY SURGERY    . LUMBAR LAMINECTOMY/DECOMPRESSION MICRODISCECTOMY Left 05/07/2017   Procedure: Decompression L4-L5 and excision of synovial cyst Left;  Surgeon: Latanya Maudlin, MD;  Location: [facility] ORS;  Service: Orthopedics;  Laterality: Left;    Current Outpatient Prescriptions  Medication Sig Dispense Refill  . atorvastatin (LIPITOR) 20 MG tablet Take 1 tablet (20 mg total) by mouth daily. 90 tablet 1  . cetirizine (ZYRTEC) 10 MG tablet Take 1 tablet (10 mg total) by mouth daily. (Patient taking differently: Take 10 mg by mouth daily as needed for allergies. ) 90 tablet 3  . Cholecalciferol (VITAMIN D3) 2000 units capsule TAKE 1 CAPSULE BY MOUTH DAILY (Patient taking differently: TAKE 1 CAPSULE BY MOUTH ONCE A WEEK ON SUNDAY'S) 30 capsule 0  . ciprofloxacin (CILOXAN) 0.3 % ophthalmic solution Place 2 drops into both eyes 2 (two) times daily. As needed for irritation with contacts    . fluticasone (FLONASE) 50 MCG/ACT nasal spray Place 2 sprays into both nostrils daily. 48 g  3  . hydrochlorothiazide (HYDRODIURIL) 25 MG tablet TAKE 1 TABLET(25 MG) BY MOUTH DAILY 90 tablet 3  . Ibuprofen-Famotidine (DUEXIS) 800-26.6 MG TABS Take by mouth.    . lidocaine (LIDODERM) 5 % Place 1 patch onto the skin daily. Remove & Discard patch within 12 hours or as directed by MD 90 patch 3  . Magnesium 250 MG TABS Take by mouth.    . methocarbamol (ROBAXIN) 500 MG tablet Take 1 tablet (500 mg total) by mouth every 6 (six) hours as needed for muscle spasms. 60 tablet 0  . OVER THE COUNTER MEDICATION Take 1 tablet by mouth 3 (three) times daily. CONNEXIN  MULTIVITAMIN    . phenytoin (DILANTIN) 100 MG ER capsule Take 2 capsules (200 mg total) by mouth every evening. 180 capsule 3   No current facility-administered medications for this visit.      ALLERGIES: Patient has no known allergies.  Family History  Problem Relation Age of Onset  . Breast cancer Mother 39       breast, recurrence 5 ys later to other breast then mastectomy  . Hypertension Mother   . Lung cancer Father 87       lung   . Diabetes Father   . Hypertension Father   . Breast cancer Sister 37       breast wih recurrence 10 yrs later and bilateral mastectomy  . Hypertension Sister     Social History   Social History  . Marital status: Widowed    Spouse name: N/A  . Number of children: N/A  . Years of education: N/A   Occupational History  . Not on file.   Social History Main Topics  . Smoking status: Former Smoker    Types: Cigarettes    Quit date: 12/31/1996  . Smokeless tobacco: Never Used  . Alcohol use Yes     Comment: Rare  . Drug use: No  . Sexual activity: Not Currently    Birth control/ protection: Surgical, Post-menopausal   Other Topics Concern  . Not on file   Social History Narrative  . No narrative on file    ROS:  Pertinent items are noted in HPI.  PHYSICAL EXAMINATION:    BP 112/64 (BP Location: Right Arm, Patient Position: Sitting, Cuff Size: Large)   Pulse 76   Resp 16   Ht 5' 1.5" (1.562 m)   Wt 171 lb (77.6 kg)   LMP 12/31/2004 (Approximate)   BMI 31.79 kg/m     General appearance: alert, cooperative and appears stated age    Pelvic: External genitalia:  no lesions              Urethra:  normal appearing urethra with no masses, tenderness or lesions              Bartholins and Skenes: normal                 Vagina: normal appearing vagina with normal color and discharge, no lesions              Cervix: no lesions                Bimanual Exam:  Uterus:  normal size, contour, position, consistency, mobility,  non-tender              Adnexa: no mass, fullness, tenderness           Chaperone was present for exam.  ASSESSMENT  FH of breast cancer.  Pelvic pressure.  Vaginitis.  PLAN  Referral for genetic counseling and testing.  I really encouraged her to complete this process.  Affirm.  GC/CT. I discussed potential for long term suppressive regimen for bacterial vaginitis if this is positive.  She would need to tx the acute infection first.    An After Visit Summary was printed and given to the patient.  __15____ minutes face to face time of which over 50% was spent in counseling.

## 2017-07-25 LAB — URINALYSIS, MICROSCOPIC ONLY: CASTS: NONE SEEN /LPF

## 2017-07-25 LAB — URINE CULTURE

## 2017-07-25 LAB — GC/CHLAMYDIA PROBE AMP
Chlamydia trachomatis, NAA: NEGATIVE
Neisseria gonorrhoeae by PCR: NEGATIVE

## 2017-07-26 ENCOUNTER — Encounter: Payer: Self-pay | Admitting: *Deleted

## 2017-07-26 ENCOUNTER — Encounter: Payer: Self-pay | Admitting: Family Medicine

## 2017-07-26 ENCOUNTER — Telehealth: Payer: Self-pay | Admitting: Obstetrics and Gynecology

## 2017-07-26 LAB — VAGINITIS/VAGINOSIS, DNA PROBE
CANDIDA SPECIES: NEGATIVE
Gardnerella vaginalis: POSITIVE — AB
Trichomonas vaginosis: NEGATIVE

## 2017-07-26 MED ORDER — METRONIDAZOLE 500 MG PO TABS: 500.0000 mg | ORAL_TABLET | Freq: Two times a day (BID) | ORAL | 0 refills | Status: DC

## 2017-07-26 NOTE — Telephone Encounter (Signed)
Phone call to discuss Affirm results showing bacterial vaginosis.  Patient having recurrent infections.  Will treat with Flagyl 500 mg po bid x 7 days.  ETOH precautions given.  Will come back in for retesting in 10 - 14 days to be sure infection is gone.  If repeat Affirm is negative, then start Metrogel pv twice a week for 4 months.   We discussed risk factors for bacterial vaginosis.

## 2017-08-14 ENCOUNTER — Ambulatory Visit (INDEPENDENT_AMBULATORY_CARE_PROVIDER_SITE_OTHER): Payer: BC Managed Care – PPO | Admitting: Obstetrics and Gynecology

## 2017-08-14 ENCOUNTER — Encounter: Payer: Self-pay | Admitting: Obstetrics and Gynecology

## 2017-08-14 VITALS — BP 124/66 | HR 60 | Ht 61.5 in | Wt 166.2 lb

## 2017-08-14 DIAGNOSIS — N760 Acute vaginitis: Secondary | ICD-10-CM | POA: Diagnosis not present

## 2017-08-14 NOTE — Progress Notes (Signed)
GYNECOLOGY  VISIT   HPI: 56 y.o.   Widowed  [place] American  female   [phone] with Patient's last menstrual period was 12/31/2004 (approximate).   here for follow up.  Affirm testing on 07/24/17 showed BV.  Patient took Flagyl 500 mg po bid x 1 week.   Not feeling pressure or odor.   She has had recurrent BV.  Did not schedule genetics counseling yet.  GYNECOLOGIC HISTORY: Patient's last menstrual period was 12/31/2004 (approximate). Contraception:  Hysterectomy Menopausal hormone therapy:  none Last mammogram:  11-20-16 Density A/Neg/BiRads1:TBC. Bil.MRI of breasts 01-09-17 negative for malignancy/BiRads1/screening yearly:[facility]  Last pap smear: 01-04-16 Neg:Neg HR HPV        OB History    Gravida Para Term Preterm AB Living   4 3     1 3    SAB TAB Ectopic Multiple Live Births       1   3         Patient Active Problem List   Diagnosis Date Noted  . Vitamin D deficiency 06/07/2017  . Spinal stenosis, lumbar region with neurogenic claudication 05/07/2017  . Allergic rhinitis 11/09/2015  . Essential hypertension 01/09/2015  . Grief reaction 01/09/2015  . Anxiety state 01/09/2015  . Adjustment disorder with mixed anxiety and depressed mood 01/09/2015  . Lumbago 01/09/2015  . B12 deficiency 05/10/2013  . Hyperlipidemia 09/15/2012  . Obese 05/22/2012  . Sciatica 05/22/2012  . Family history of breast cancer 05/01/2012  . 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 Seizure disorder ([place]) 01/21/2012    Past Medical History:  Diagnosis Date  . Carpal tunnel syndrome of right wrist   . Elevated cholesterol   . Family history of adverse reaction to anesthesia    sister has [place]  . Hypertension   . Increased risk of breast cancer 2018   27.7 lifetime risk.  Yearly MRI of breast.   . Low back pain   . OAB (overactive bladder)   . Pneumonia   . Seizures ([place])     Past Surgical History:  Procedure Laterality Date  . ABDOMINAL HYSTERECTOMY  06/23/2004   supracervical hysterectomy.  Ovaries remain.  Marland Kitchen CESAREAN SECTION    . ECTOPIC PREGNANCY SURGERY    . LUMBAR LAMINECTOMY/DECOMPRESSION MICRODISCECTOMY Left 05/07/2017   Procedure: Decompression L4-L5 and excision of synovial cyst Left;  Surgeon: Latanya Maudlin, MD;  Location: [facility];  Service: Orthopedics;  Laterality: Left;    Current Outpatient Prescriptions  Medication Sig Dispense Refill  . atorvastatin (LIPITOR) 20 MG tablet Take 1 tablet (20 mg total) by mouth daily. 90 tablet 1  . cetirizine (ZYRTEC) 10 MG tablet Take 1 tablet (10 mg total) by mouth daily. (Patient taking differently: Take 10 mg by mouth daily as needed for allergies. ) 90 tablet 3  . Cholecalciferol (VITAMIN D3) 2000 units capsule TAKE 1 CAPSULE BY MOUTH DAILY (Patient taking differently: TAKE 1 CAPSULE BY MOUTH ONCE A WEEK ON SUNDAY'S) 30 capsule 0  . ciprofloxacin (CILOXAN) 0.3 % ophthalmic solution Place 2 drops into both eyes 2 (two) times daily. As needed for irritation with contacts    . fluticasone (FLONASE) 50 MCG/ACT nasal spray Place 2 sprays into both nostrils daily. 48 g 3  . hydrochlorothiazide (HYDRODIURIL) 25 MG tablet TAKE 1 TABLET(25 MG) BY MOUTH DAILY 90 tablet 3  . Ibuprofen-Famotidine (DUEXIS) 800-26.6 MG TABS Take by mouth.    . lidocaine (LIDODERM) 5 % Place 1 patch onto the skin daily. Remove & Discard patch within  12 hours or as directed by MD 90 patch 3  . Magnesium 250 MG TABS Take by mouth.    . methocarbamol (ROBAXIN) 500 MG tablet Take 1 tablet (500 mg total) by mouth every 6 (six) hours as needed for muscle spasms. 60 tablet 0  . OVER THE COUNTER MEDICATION Take 1 tablet by mouth 3 (three) times daily. CONNEXIN MULTIVITAMIN    . phenytoin (DILANTIN) 100 MG ER capsule Take 2 capsules (200 mg total) by mouth every evening. 180 capsule 3   No current facility-administered medications for this visit.      ALLERGIES: Patient has no known allergies.  Family History  Problem Relation Age of  Onset  . Breast cancer Mother 93       breast, recurrence 5 ys later to other breast then mastectomy  . Hypertension Mother   . Lung cancer Father 62       lung   . Diabetes Father   . Hypertension Father   . Breast cancer Sister 58       breast wih recurrence 10 yrs later and bilateral mastectomy  . Hypertension Sister     Social History   Social History  . Marital status: Widowed    Spouse name: N/A  . Number of children: N/A  . Years of education: N/A   Occupational History  . Not on file.   Social History Main Topics  . Smoking status: Former Smoker    Types: Cigarettes    Quit date: 12/31/1996  . Smokeless tobacco: Never Used  . Alcohol use Yes     Comment: Rare  . Drug use: No  . Sexual activity: Not Currently    Birth control/ protection: Surgical, Post-menopausal   Other Topics Concern  . Not on file   Social History Narrative  . No narrative on file    ROS:  Pertinent items are noted in HPI.  PHYSICAL EXAMINATION:    BP 124/66 (BP Location: Right Arm, Patient Position: Sitting, Cuff Size: Large)   Pulse 60   Ht 5' 1.5" (1.562 m)   Wt 166 lb 3.2 oz (75.4 kg)   LMP 12/31/2004 (Approximate)   BMI 30.89 kg/m     General appearance: alert, cooperative and appears stated age   Pelvic: External genitalia:  no lesions              Urethra:  normal appearing urethra with no masses, tenderness or lesions              Bartholins and Skenes: normal                 Vagina: normal appearing vagina with normal color and discharge, no lesions              Cervix: no lesions                Bimanual Exam:  Uterus:  normal size, contour, position, consistency, mobility, non-tender              Adnexa: no mass, fullness, tenderness             Chaperone was present for exam.  ASSESSMENT  Hx recurrent BV. FH breast cancer.   PLAN  Affirm.  Will tx with Metrogel pv 2x per week for 4 months if this Affirm is negative.  Discussed avoidance of ETOH while using  metronidazole. Will provide her with phone number of [facility] for her to schedule her genetics counseling/testing.  An After Visit Summary was printed and given to the patient.  __15____ minutes face to face time of which over 50% was spent in counseling.

## 2017-08-15 ENCOUNTER — Encounter: Payer: Self-pay | Admitting: Family Medicine

## 2017-08-15 LAB — VAGINITIS/VAGINOSIS, DNA PROBE
CANDIDA SPECIES: NEGATIVE
Gardnerella vaginalis: POSITIVE — AB
TRICHOMONAS VAG: NEGATIVE

## 2017-08-16 ENCOUNTER — Telehealth: Payer: Self-pay

## 2017-08-16 MED ORDER — METRONIDAZOLE 0.75 % VA GEL: 1.0000 | Freq: Every day | VAGINAL | 2 refills | Status: DC

## 2017-08-16 NOTE — Telephone Encounter (Signed)
-----   Message from Nunzio Cobbs, MD sent at 08/15/2017  1:00 PM EDT ----- Please contact the patient regarding her Affirm results. She just completed a course of Flagyl for BV and came in for retesting prior to starting suppression therapy.  Current testing shows she still is positive for BV, but she is not symptomatic.  I recommend she start Metrogel 0.75% pv at hs for 7 nights.  Then continue Metrogel pv at hs twice per week for 4 months.

## 2017-08-16 NOTE — Telephone Encounter (Signed)
Spoke with patient and notified her of Affirm results and Dr.Silva's recommendations as per message below. Rx for Metrogel 0.75% pv at hs for 7 nights. Then continue at hs twice weekly for 4 months, 2refills.

## 2017-08-26 ENCOUNTER — Encounter: Payer: Self-pay | Admitting: Family Medicine

## 2017-09-06 ENCOUNTER — Other Ambulatory Visit: Payer: BC Managed Care – PPO

## 2017-09-06 DIAGNOSIS — E785 Hyperlipidemia, unspecified: Secondary | ICD-10-CM

## 2017-09-06 LAB — COMPREHENSIVE METABOLIC PANEL
AG RATIO: 1.6 (calc) (ref 1.0–2.5)
ALBUMIN MSPROF: 4.1 g/dL (ref 3.6–5.1)
ALT: 10 U/L (ref 6–29)
AST: 11 U/L (ref 10–35)
Alkaline phosphatase (APISO): 83 U/L (ref 33–130)
BUN / CREAT RATIO: 29 (calc) — AB (ref 6–22)
BUN: 28 mg/dL — ABNORMAL HIGH (ref 7–25)
CHLORIDE: 101 mmol/L (ref 98–110)
CO2: 28 mmol/L (ref 20–32)
CREATININE: 0.97 mg/dL (ref 0.50–1.05)
Calcium: 9.2 mg/dL (ref 8.6–10.4)
GLOBULIN: 2.6 g/dL (ref 1.9–3.7)
GLUCOSE: 115 mg/dL — AB (ref 65–99)
POTASSIUM: 3.4 mmol/L — AB (ref 3.5–5.3)
SODIUM: 139 mmol/L (ref 135–146)
TOTAL PROTEIN: 6.7 g/dL (ref 6.1–8.1)
Total Bilirubin: 0.3 mg/dL (ref 0.2–1.2)

## 2017-09-06 LAB — LIPID PANEL
CHOL/HDL RATIO: 3.7 (calc) (ref <5.0)
Cholesterol: 294 mg/dL — ABNORMAL HIGH (ref <200)
HDL: 79 mg/dL (ref >50)
LDL Cholesterol (Calc): 191 mg/dL (calc) — ABNORMAL HIGH
NON-HDL CHOLESTEROL (CALC): 215 mg/dL — AB (ref <130)
Triglycerides: 110 mg/dL (ref <150)

## 2017-09-09 ENCOUNTER — Telehealth: Payer: Self-pay

## 2017-09-09 ENCOUNTER — Encounter: Payer: Self-pay | Admitting: Family Medicine

## 2017-09-09 NOTE — Telephone Encounter (Signed)
Let pt know, we will continue to watch She will continue to hold the liptor Her blood sugar was up so I will check a A1C and send her results via mychart

## 2017-09-09 NOTE — Telephone Encounter (Signed)
-----   Message from Alycia Rossetti, MD sent at 09/08/2017  8:18 PM EDT ----- Glucose was elevated at 115 if she was not fasting then add on A1C LDL and TC have come down A LITTLE over 50points Verify if she is still taking the lipitor and is she still on KETOGENIC diet, as this has caused her cholesterol to go up.

## 2017-09-09 NOTE — Telephone Encounter (Signed)
Spoke with patient regarding lab results. She states she does not take liptior  and she is still on the ketogenic diet and states that in the beginning it will cause the cholesterol to go up, but then after being on it for awhile it will come back down.

## 2017-09-12 ENCOUNTER — Other Ambulatory Visit: Payer: Self-pay

## 2017-09-12 ENCOUNTER — Encounter: Payer: Self-pay | Admitting: Family Medicine

## 2017-09-12 MED ORDER — LIDOCAINE 5 % EX PTCH: 1.0000 | MEDICATED_PATCH | CUTANEOUS | 3 refills | Status: DC

## 2017-09-12 NOTE — Telephone Encounter (Signed)
Refill appropriate 

## 2017-09-13 ENCOUNTER — Other Ambulatory Visit: Payer: Self-pay

## 2017-09-13 ENCOUNTER — Telehealth: Payer: Self-pay | Admitting: Family Medicine

## 2017-09-13 NOTE — Telephone Encounter (Signed)
Spoke with Shirlee Limerick at [facility] she will process the request

## 2017-09-13 NOTE — Telephone Encounter (Signed)
New Message  Pt voiced when she called the drug store they did not have it even though it shows it was sent yesterday and confirmed with pt it was sent yesterday with the correct pharmacy on file.  Please resend.  *STAT* If patient is at the pharmacy, call can be transferred to refill team.   1. Which medications need to be refilled? (please list name of each medication and dose if known)   lidocaine (LIDODERM) 5 % 90 patch 3 09/12/2017    Sig - Route: Place 1 patch onto the skin daily. Remove & Discard patch within 12 hours or as directed by MD - Transdermal     2. Which pharmacy/location (including street and city if local pharmacy) is medication to be sent to? [id number] - [place], [place] - [address] AT [address]  3. Do they need a 30 day or 90 day supply? 90 day supply

## 2017-09-16 ENCOUNTER — Telehealth: Payer: Self-pay

## 2017-09-16 MED ORDER — SCOPOLAMINE 1 MG/3DAYS TD PT72: 1.0000 | MEDICATED_PATCH | TRANSDERMAL | 1 refills | Status: DC

## 2017-09-16 NOTE — Telephone Encounter (Signed)
okay

## 2017-09-16 NOTE — Telephone Encounter (Signed)
Patient was requesting the motion sickness patches change has been made. Pt aware

## 2017-09-16 NOTE — Telephone Encounter (Signed)
Patient requesting a refill on Transderm scop 1mg /3days patch okay to refill?

## 2017-09-19 ENCOUNTER — Telehealth: Payer: Self-pay | Admitting: *Deleted

## 2017-09-19 NOTE — Telephone Encounter (Signed)
Received request from pharmacy for PA on Lidoderm Patches.  PA submitted.   Dx: M70.62- L Hip Pain

## 2017-09-19 NOTE — Telephone Encounter (Signed)
Call pt I think she was asking for Scopalamine patches and lidoderm was sent in orginally? If she is asking for them, her insurance is not going to cover as she does not have those diagnosis  But she could try a flector patch which is an NSAID patch

## 2017-09-19 NOTE — Telephone Encounter (Signed)
Your information has been submitted to [employer]. To check for an updated outcome later, reopen this PA request from your dashboard.  If [employer] has not responded to your request within 24 hours, contact [employer] at [phone] 

## 2017-09-19 NOTE — Telephone Encounter (Signed)
Received PA determination.   PA denied.   Lidoderm is covered if the patient has one of the following: 1) pain associated with post herpatic neuralgia 2) pain associated with diabetic neuropathy 3) pain associated with cancer related neuropathy  MD please advise.

## 2017-09-20 NOTE — Telephone Encounter (Signed)
Call placed to patient. LMTRC.  

## 2017-09-24 NOTE — Telephone Encounter (Signed)
Call placed to patient.   Reports that she was trying to gedrt Trans Scop patches for cruise on  09/27/2017. States that [employer] base fills lidoderm patches with no issues.

## 2017-11-01 ENCOUNTER — Telehealth: Payer: Self-pay | Admitting: Obstetrics and Gynecology

## 2017-11-01 NOTE — Telephone Encounter (Signed)
Spoke with patient. Patient would like to know if she can drink while using Metrogel as she will be using it 2 times a week for 4 months. Advised to avoid alcohol during treatment and 24 hours after completing medication. Don't mix with alcohol if mixed can cause severe nausea, vomiting and abdominal cramping. Patient is agreeable.  Routing to provider for final review. Patient agreeable to disposition. Will close encounter.

## 2017-11-01 NOTE — Telephone Encounter (Signed)
Patient has a question for nurse regarding her medication and drinking alcohol.

## 2017-11-15 ENCOUNTER — Encounter: Payer: Self-pay | Admitting: Family Medicine

## 2017-11-26 ENCOUNTER — Other Ambulatory Visit: Payer: Self-pay | Admitting: Obstetrics and Gynecology

## 2017-11-26 DIAGNOSIS — Z1231 Encounter for screening mammogram for malignant neoplasm of breast: Secondary | ICD-10-CM

## 2017-12-02 ENCOUNTER — Telehealth: Payer: Self-pay | Admitting: Obstetrics and Gynecology

## 2017-12-02 NOTE — Telephone Encounter (Signed)
Patient called requesting to speak with the nurse about needing an appointment with Dr. Quincy Simmonds.

## 2017-12-02 NOTE — Telephone Encounter (Signed)
Spoke with patient. Patient requesting OV for recheck of BV after using metrogel for 4 months. Started metrogel 08/16/17. Denies any current symptoms. Patient asking when to stop metrogel?   OV scheduled for 12/11/17 at 10:45am with Dr. Quincy Simmonds. Will review with Dr. Quincy Simmonds and return call with recommendations of when to stop metrogel, patient is agreeable.   Dr. Quincy Simmonds -please advise when to stop metrogel prior to appt?

## 2017-12-03 NOTE — Telephone Encounter (Signed)
Spoke with patient, advised as seen below per Dr. Quincy Simmonds. Patient verbalizes understanding and is agreeable. Will close encounter.

## 2017-12-03 NOTE — Telephone Encounter (Signed)
Please stop Metrogel now.

## 2017-12-09 ENCOUNTER — Ambulatory Visit: Payer: BC Managed Care – PPO | Admitting: Family Medicine

## 2017-12-11 ENCOUNTER — Ambulatory Visit: Payer: Self-pay | Admitting: Obstetrics and Gynecology

## 2017-12-13 ENCOUNTER — Other Ambulatory Visit: Payer: Self-pay

## 2017-12-13 ENCOUNTER — Encounter: Payer: Self-pay | Admitting: Family Medicine

## 2017-12-13 ENCOUNTER — Ambulatory Visit (INDEPENDENT_AMBULATORY_CARE_PROVIDER_SITE_OTHER): Payer: BC Managed Care – PPO | Admitting: Family Medicine

## 2017-12-13 VITALS — BP 122/64 | HR 72 | Temp 98.1°F | Resp 14 | Ht 61.5 in | Wt 170.0 lb

## 2017-12-13 DIAGNOSIS — E785 Hyperlipidemia, unspecified: Secondary | ICD-10-CM | POA: Diagnosis not present

## 2017-12-13 DIAGNOSIS — I10 Essential (primary) hypertension: Secondary | ICD-10-CM | POA: Diagnosis not present

## 2017-12-13 DIAGNOSIS — E559 Vitamin D deficiency, unspecified: Secondary | ICD-10-CM | POA: Diagnosis not present

## 2017-12-13 DIAGNOSIS — G40909 Epilepsy, unspecified, not intractable, without status epilepticus: Secondary | ICD-10-CM | POA: Diagnosis not present

## 2017-12-13 DIAGNOSIS — Z23 Encounter for immunization: Secondary | ICD-10-CM | POA: Diagnosis not present

## 2017-12-13 MED ORDER — AMOXICILLIN 875 MG PO TABS: 875.0000 mg | ORAL_TABLET | Freq: Two times a day (BID) | ORAL | 0 refills | Status: DC

## 2017-12-13 MED ORDER — HYDROCHLOROTHIAZIDE 25 MG PO TABS: ORAL_TABLET | ORAL | 3 refills | Status: DC

## 2017-12-13 NOTE — Patient Instructions (Addendum)
Take 1/2 tablet of HCTZ  Try the xyzal Try the nasacort Start amoxicillin if not improved F/U 6 months

## 2017-12-13 NOTE — Assessment & Plan Note (Signed)
Check lipids, no current meds, but now more stable on ketogenic diet

## 2017-12-13 NOTE — Progress Notes (Signed)
   Subjective:    Patient ID: Lisa [name], female    DOB: 1961/04/13, 56 y.o.   MRN: [id number]  Patient presents for Follow-up (is fasting) and Medication Management (would like to switch allergy medications)  Patient here to follow-up chronic medical problems. He has been following a ketogenic diet for weight loss.  Her cholesterol has rebounded up which can occur as this is a high fat diet.  Her LDL was at 191. Ultimate goal 155  40lbs off   Vitamins  Hypertension taking blood pressure medicine as prescribed  History of seizure disorder she has not had any seizure activity in many years but is maintained on Dilantin for prophylaxis  Review Of Systems:  GEN- denies fatigue, fever, weight loss,weakness, recent illness HEENT- denies eye drainage, change in vision, nasal discharge, CVS- denies chest pain, palpitations RESP- denies SOB, cough, wheeze ABD- denies N/V, change in stools, abd pain GU- denies dysuria, hematuria, dribbling, incontinence MSK- denies joint pain, muscle aches, injury Neuro- denies headache, dizziness, syncope, seizure activity       Objective:    BP 122/64   Pulse 72   Temp 98.1 F (36.7 C) (Oral)   Resp 14   Ht 5' 1.5" (1.562 m)   Wt 170 lb (77.1 kg)   LMP 12/31/2004 (Approximate)   SpO2 96%   BMI 31.60 kg/m  GEN- NAD, alert and oriented x3 HEENT- PERRL, EOMI, non injected sclera, pink conjunctiva, MMM, oropharynx clear Neck- Supple, no thyromegaly CVS- RRR, no murmur RESP-CTAB ABD-NABS,soft,NT,ND EXT- No edema Pulses- Radial, DP- 2+        Assessment & Plan:      Problem List Items Addressed This Visit      Unprioritized   Vitamin D deficiency    Check Vitamin D stores      Relevant Orders   Vitamin D, 25-hydroxy   Seizure disorder (HCC)    No seizure activity On Dilantin      Hyperlipidemia    Check lipids, no current meds, but now more stable on ketogenic diet      Relevant Medications   hydrochlorothiazide  (HYDRODIURIL) 25 MG tablet   Other Relevant Orders   Lipid panel   Essential hypertension - Primary    With weight loss, try decreasing to 12.5mg  daily      Relevant Medications   hydrochlorothiazide (HYDRODIURIL) 25 MG tablet   Other Relevant Orders   CBC with Differential/Platelet   Comprehensive metabolic panel    Other Visit Diagnoses    Need for diphtheria-tetanus-pertussis (Tdap) vaccine       Relevant Orders   Tdap vaccine greater than or equal to 7yo IM (Completed)      Note: This dictation was prepared with Dragon dictation along with smaller phrase technology. Any transcriptional errors that result from this process are unintentional.

## 2017-12-13 NOTE — Assessment & Plan Note (Signed)
Check Vitamin D stores

## 2017-12-13 NOTE — Assessment & Plan Note (Signed)
No seizure activity On Dilantin

## 2017-12-13 NOTE — Assessment & Plan Note (Signed)
With weight loss, try decreasing to 12.5mg  daily

## 2017-12-14 LAB — LIPID PANEL
CHOLESTEROL: 341 mg/dL — AB (ref <200)
HDL: 90 mg/dL (ref >50)
LDL Cholesterol (Calc): 231 mg/dL (calc) — ABNORMAL HIGH
Non-HDL Cholesterol (Calc): 251 mg/dL (calc) — ABNORMAL HIGH (ref <130)
Total CHOL/HDL Ratio: 3.8 (calc) (ref <5.0)
Triglycerides: 88 mg/dL (ref <150)

## 2017-12-14 LAB — COMPREHENSIVE METABOLIC PANEL
AG Ratio: 1.7 (calc) (ref 1.0–2.5)
ALBUMIN MSPROF: 4.3 g/dL (ref 3.6–5.1)
ALKALINE PHOSPHATASE (APISO): 76 U/L (ref 33–130)
ALT: 32 U/L — ABNORMAL HIGH (ref 6–29)
AST: 30 U/L (ref 10–35)
BILIRUBIN TOTAL: 0.4 mg/dL (ref 0.2–1.2)
BUN: 12 mg/dL (ref 7–25)
CALCIUM: 9.4 mg/dL (ref 8.6–10.4)
CHLORIDE: 98 mmol/L (ref 98–110)
CO2: 27 mmol/L (ref 20–32)
Creat: 0.86 mg/dL (ref 0.50–1.05)
GLOBULIN: 2.5 g/dL (ref 1.9–3.7)
Glucose, Bld: 85 mg/dL (ref 65–99)
POTASSIUM: 3.9 mmol/L (ref 3.5–5.3)
Sodium: 139 mmol/L (ref 135–146)
Total Protein: 6.8 g/dL (ref 6.1–8.1)

## 2017-12-14 LAB — CBC WITH DIFFERENTIAL/PLATELET
BASOS PCT: 0.4 %
Basophils Absolute: 20 cells/uL (ref 0–200)
EOS ABS: 61 {cells}/uL (ref 15–500)
Eosinophils Relative: 1.2 %
HEMATOCRIT: 39.8 % (ref 35.0–45.0)
Hemoglobin: 13.7 g/dL (ref 11.7–15.5)
LYMPHS ABS: 2428 {cells}/uL (ref 850–3900)
MCH: 29 pg (ref 27.0–33.0)
MCHC: 34.4 g/dL (ref 32.0–36.0)
MCV: 84.1 fL (ref 80.0–100.0)
MPV: 10.6 fL (ref 7.5–12.5)
Monocytes Relative: 6.9 %
NEUTROS PCT: 43.9 %
Neutro Abs: 2239 cells/uL (ref 1500–7800)
Platelets: 282 10*3/uL (ref 140–400)
RBC: 4.73 10*6/uL (ref 3.80–5.10)
RDW: 13.7 % (ref 11.0–15.0)
TOTAL LYMPHOCYTE: 47.6 %
WBC: 5.1 10*3/uL (ref 3.8–10.8)
WBCMIX: 352 {cells}/uL (ref 200–950)

## 2017-12-14 LAB — VITAMIN D 25 HYDROXY (VIT D DEFICIENCY, FRACTURES): Vit D, 25-Hydroxy: 42 ng/mL (ref 30–100)

## 2017-12-17 ENCOUNTER — Other Ambulatory Visit: Payer: Self-pay

## 2017-12-17 ENCOUNTER — Encounter: Payer: Self-pay | Admitting: Certified Nurse Midwife

## 2017-12-17 ENCOUNTER — Ambulatory Visit (INDEPENDENT_AMBULATORY_CARE_PROVIDER_SITE_OTHER): Payer: BC Managed Care – PPO | Admitting: Certified Nurse Midwife

## 2017-12-17 VITALS — BP 108/62 | HR 60 | Resp 16 | Ht 61.5 in | Wt 171.0 lb

## 2017-12-17 DIAGNOSIS — B9689 Other specified bacterial agents as the cause of diseases classified elsewhere: Secondary | ICD-10-CM

## 2017-12-17 DIAGNOSIS — N760 Acute vaginitis: Secondary | ICD-10-CM | POA: Diagnosis not present

## 2017-12-17 DIAGNOSIS — N898 Other specified noninflammatory disorders of vagina: Secondary | ICD-10-CM | POA: Diagnosis not present

## 2017-12-17 NOTE — Progress Notes (Signed)
56 y.o. Widowed [place] American female [phone] here for follow up of chronic BV. Has been treating with Metrogel twice weekly for 4 months, per Dr. Quincy Simmonds. Denies any vaginal itching and odor. Has noted slight burning today when urine touched skin and entrance to vagina only.Has not noted odor since use. No new personal products. No thong underwear use. Not sexually active recently. No other health issues today  ROS Pertinent to above.  O:Healthy female WDWN Affect: normal, orientation x 3  Exam: Skin warm and dry Abdomen: not  tender Lymph node: no enlargement or tenderness Pelvic exam: External genital: normal female BUS: negative Vagina: small tear noted at fourchette, scant non odorous discharge noted.  Affirm taken Cervix: absent Uterus: absent Adnexa:normal, non tender, no masses or fullness noted Lab: Affirm  A:Normal pelvic exam History of chronic BV with Metrogel treatment for 4 months Vaginal dryness   P:Discussed findings of normal pelvic exam. Discussed will advise once lab in if BV present. Aware normal flora of vagina also and etiology. Discussed vaginal dryness finding and treatment with OTC coconut oil. Patient interested in trying to see if symptoms improve.  Discussed Aveeno or baking soda sitz bath for comfort also. Avoid moist clothes or pads for extended period of time. If working out in gym clothes for long periods of time change underweari if possible. Coconut Oil use for skin protection prior to activity can be used to external skin for protection.   Lab Affirm  Rv prn

## 2017-12-17 NOTE — Patient Instructions (Signed)
Atrophic Vaginitis Atrophic vaginitis is when the tissues that line the vagina become dry and thin. This is caused by a drop in estrogen. Estrogen helps:  To keep the vagina moist.  To make a clear fluid that helps: ? To lubricate the vagina for sex. ? To protect the vagina from infection.  If the lining of the vagina is dry and thin, it may:  Make sex painful. It may also cause bleeding.  Cause a feeling of: ? Burning. ? Irritation. ? Itchiness.  Make an exam of your vagina painful. It may also cause bleeding.  Make you lose interest in sex.  Cause a burning feeling when you pee.  Make your vaginal fluid (discharge) brown or yellow.  For some women, there are no symptoms. This condition is most common in women who do not get their regular menstrual periods anymore (menopause). This often starts when a woman is 45-55 years old. Follow these instructions at home:  Take medicines only as told by your doctor. Do not use any herbal or alternative medicines unless your doctor says it is okay.  Use over-the-counter products for dryness only as told by your doctor. These include: ? Creams. ? Lubricants. ? Moisturizers.  Do not douche.  Do not use products that can make your vagina dry. These include: ? Scented feminine sprays. ? Scented tampons. ? Scented soaps.  If it hurts to have sex, tell your sexual partner. Contact a doctor if:  Your discharge looks different than normal.  Your vagina has an unusual smell.  You have new symptoms.  Your symptoms do not get better with treatment.  Your symptoms get worse. This information is not intended to replace advice given to you by your health care provider. Make sure you discuss any questions you have with your health care provider. Document Released: 06/04/2008 Document Revised: 05/24/2016 Document Reviewed: 12/08/2014 Elsevier Interactive Patient Education  2018 [employer]  

## 2017-12-19 LAB — VAGINITIS/VAGINOSIS, DNA PROBE
Candida Species: NEGATIVE
Gardnerella vaginalis: NEGATIVE
Trichomonas vaginosis: NEGATIVE

## 2017-12-23 ENCOUNTER — Encounter: Payer: Self-pay | Admitting: *Deleted

## 2017-12-27 ENCOUNTER — Ambulatory Visit
Admission: RE | Admit: 2017-12-27 | Discharge: 2017-12-27 | Disposition: A | Discharge status: Home / Self Care | Payer: BC Managed Care – PPO | Source: Ambulatory Visit | Attending: Obstetrics and Gynecology | Admitting: Obstetrics and Gynecology

## 2017-12-27 DIAGNOSIS — Z1231 Encounter for screening mammogram for malignant neoplasm of breast: Secondary | ICD-10-CM

## 2017-12-28 ENCOUNTER — Encounter: Payer: Self-pay | Admitting: Family Medicine

## 2017-12-30 MED ORDER — LEVOCETIRIZINE DIHYDROCHLORIDE 5 MG PO TABS: 5.0000 mg | ORAL_TABLET | Freq: Every evening | ORAL | 3 refills | Status: DC

## 2017-12-30 MED ORDER — TRIAMCINOLONE ACETONIDE 55 MCG/ACT NA AERO: 2.0000 | INHALATION_SPRAY | Freq: Every day | NASAL | 3 refills | Status: DC

## 2018-01-23 ENCOUNTER — Ambulatory Visit: Payer: BC Managed Care – PPO | Admitting: Obstetrics and Gynecology

## 2018-02-05 ENCOUNTER — Encounter: Payer: Self-pay | Admitting: Certified Nurse Midwife

## 2018-02-05 ENCOUNTER — Other Ambulatory Visit: Payer: Self-pay

## 2018-02-05 ENCOUNTER — Ambulatory Visit (INDEPENDENT_AMBULATORY_CARE_PROVIDER_SITE_OTHER): Payer: BC Managed Care – PPO | Admitting: Certified Nurse Midwife

## 2018-02-05 VITALS — BP 130/86 | HR 70 | Resp 16 | Ht 61.75 in | Wt 170.0 lb

## 2018-02-05 DIAGNOSIS — N952 Postmenopausal atrophic vaginitis: Secondary | ICD-10-CM | POA: Diagnosis not present

## 2018-02-05 DIAGNOSIS — Z9189 Other specified personal risk factors, not elsewhere classified: Secondary | ICD-10-CM | POA: Diagnosis not present

## 2018-02-05 DIAGNOSIS — Z01419 Encounter for gynecological examination (general) (routine) without abnormal findings: Secondary | ICD-10-CM | POA: Diagnosis not present

## 2018-02-05 DIAGNOSIS — N951 Menopausal and female climacteric states: Secondary | ICD-10-CM | POA: Diagnosis not present

## 2018-02-05 NOTE — Progress Notes (Signed)
57 y.o. [id number] Widowed  African American Fe here for annual exam. Menopausal no HRT. Vaginal dryness is resolved with coconut oil use! Feeling much better. Having no issues with sexual activity. Sees PCP for hypertension/Dilantin,Vitamin D management. No other health issues today. [place]  Patient's last menstrual period was 12/31/2004 (approximate).          Sexually active: Yes.    The current method of family planning is status post hysterectomy.    Exercising: No.  exercise Smoker:  no  Health Maintenance: Pap:  01-04-16 neg HPV HR neg History of Abnormal Pap: yes MMG:  12-27-17 category b density birads 1:neg Self Breast exams: no Colonoscopy:  2017 f/u 63yrs BMD:   none TDaP:  2018 Shingles: no Pneumonia: no Hep C and HIV: both neg 2017 Labs: no   reports that she quit smoking about 21 years ago. Her smoking use included cigarettes. she has never used smokeless tobacco. She reports that she drinks about 0.6 - 1.2 oz of alcohol per week. She reports that she does not use drugs.  Past Medical History:  Diagnosis Date  . Carpal tunnel syndrome of right wrist   . Elevated cholesterol   . Family history of adverse reaction to anesthesia    sister has naseau  . Hypertension   . Increased risk of breast cancer 2018   27.7 lifetime risk.  Yearly MRI of breast.   . Low back pain   . OAB (overactive bladder)   . Pneumonia   . Seizures ([place])     Past Surgical History:  Procedure Laterality Date  . ABDOMINAL HYSTERECTOMY  06/23/2004   supracervical hysterectomy.  Ovaries remain.  Marland Kitchen CESAREAN SECTION    . ECTOPIC PREGNANCY SURGERY    . LUMBAR LAMINECTOMY/DECOMPRESSION MICRODISCECTOMY Left 05/07/2017   Procedure: Decompression L4-L5 and excision of synovial cyst Left;  Surgeon: Latanya Maudlin, MD;  Location: [facility];  Service: Orthopedics;  Laterality: Left;    Current Outpatient Medications  Medication Sig Dispense Refill  . Cholecalciferol (VITAMIN  D3) 2000 units capsule TAKE 1 CAPSULE BY MOUTH DAILY (Patient taking differently: twice weekly) 30 capsule 0  . ciprofloxacin (CILOXAN) 0.3 % ophthalmic solution Place 2 drops into both eyes 2 (two) times daily. As needed for irritation with contacts    . hydrochlorothiazide (HYDRODIURIL) 25 MG tablet Take 1/2 tablet once a day 90 tablet 3  . levocetirizine (XYZAL) 5 MG tablet Take 1 tablet (5 mg total) by mouth every evening. 90 tablet 3  . methocarbamol (ROBAXIN) 500 MG tablet Take 1 tablet (500 mg total) by mouth every 6 (six) hours as needed for muscle spasms. 60 tablet 0  . phenytoin (DILANTIN) 100 MG ER capsule Take 2 capsules (200 mg total) by mouth every evening. 180 capsule 3  . triamcinolone (NASACORT) 55 MCG/ACT AERO nasal inhaler Place 2 sprays into the nose daily. 3 Inhaler 3  . scopolamine (TRANSDERM-SCOP, 1.5 MG,) 1 MG/3DAYS Place 1 patch (1.5 mg total) onto the skin every 3 (three) days. (Patient not taking: Reported on 02/05/2018) 10 patch 1   No current facility-administered medications for this visit.     Family History  Problem Relation Age of Onset  . Breast cancer Mother 55       breast, recurrence 5 ys later to other breast then mastectomy  . Hypertension Mother   . Lung cancer Father 9       lung   . Diabetes Father   . Hypertension  Father   . Breast cancer Sister 27       breast wih recurrence 10 yrs later and bilateral mastectomy  . Hypertension Sister     ROS:  Pertinent items are noted in HPI.  Otherwise, a comprehensive ROS was negative.  Exam:   BP 130/86   Pulse 70   Resp 16   Ht 5' 1.75" (1.568 m)   Wt 170 lb (77.1 kg)   LMP 12/31/2004 (Approximate)   BMI 31.35 kg/m  Height: 5' 1.75" (156.8 cm) Ht Readings from Last 3 Encounters:  02/05/18 5' 1.75" (1.568 m)  12/17/17 5' 1.5" (1.562 m)  12/13/17 5' 1.5" (1.562 m)    General appearance: alert, cooperative and appears stated age Head: Normocephalic, without obvious abnormality,  atraumatic Neck: no adenopathy, supple, symmetrical, trachea midline and thyroid normal to inspection and palpation Lungs: clear to auscultation bilaterally Breasts: normal appearance, no masses or tenderness, No nipple retraction or dimpling, No nipple discharge or bleeding, No axillary or supraclavicular adenopathy Heart: regular rate and rhythm Abdomen: soft, non-tender; no masses,  no organomegaly Extremities: extremities normal, atraumatic, no cyanosis or edema Skin: Skin color, texture, turgor normal. No rashes or lesions Lymph nodes: Cervical, supraclavicular, and axillary nodes normal. No abnormal inguinal nodes palpated Neurologic: Grossly normal   Pelvic: External genitalia:  no lesions              Urethra:  normal appearing urethra with no masses, tenderness or lesions              Bartholin's and Skene's: normal                 Vagina: normal appearing vagina with normal color and discharge, no lesions              Cervix: no cervical motion tenderness and no lesions              Pap taken: No. Bimanual Exam:  Uterus:  uterus absent              Adnexa: normal adnexa and no mass, fullness, tenderness               Rectovaginal: Confirms               Anus:  normal sphincter tone, no lesions  Chaperone present: yes  A:  Well Woman with normal exam  Menopausal no HRT  Atrophic vaginitis, coconut oil working well   MD management of Dilantin, hypertension,Vitamin D  Increase risk of breast cancer,  Needs MRI yearly    P:   Reviewed health and wellness pertinent to exam  Aware if vaginal bleeding to advise  Discussed continued use and advise if problems  Continue MD management as indicated  Discussed importance of SBE and mammogram and MRI  Pap smear: no   counseled on breast self exam, mammography screening, feminine hygiene, adequate intake of calcium and vitamin D, diet and exercise, Kegel's exercises  return annually or prn  An After Visit Summary was printed  and given to the patient.

## 2018-02-05 NOTE — Patient Instructions (Signed)

## 2018-02-26 ENCOUNTER — Ambulatory Visit: Payer: BC Managed Care – PPO | Admitting: Certified Nurse Midwife

## 2018-04-04 ENCOUNTER — Encounter: Payer: Self-pay | Admitting: Family Medicine

## 2018-04-07 MED ORDER — METHOCARBAMOL 500 MG PO TABS: 500.0000 mg | ORAL_TABLET | Freq: Four times a day (QID) | ORAL | 0 refills | Status: DC | PRN

## 2018-04-07 MED ORDER — LEVOCETIRIZINE DIHYDROCHLORIDE 5 MG PO TABS: 5.0000 mg | ORAL_TABLET | Freq: Every evening | ORAL | 3 refills | Status: DC

## 2018-04-07 MED ORDER — TRIAMCINOLONE ACETONIDE 55 MCG/ACT NA AERO: 2.0000 | INHALATION_SPRAY | Freq: Every day | NASAL | 3 refills | Status: DC

## 2018-04-07 MED ORDER — PHENYTOIN SODIUM EXTENDED 100 MG PO CAPS: 200.0000 mg | ORAL_CAPSULE | Freq: Every evening | ORAL | 3 refills | Status: DC

## 2018-04-07 MED ORDER — HYDROCHLOROTHIAZIDE 25 MG PO TABS: ORAL_TABLET | ORAL | 3 refills | Status: DC

## 2018-04-08 ENCOUNTER — Other Ambulatory Visit: Payer: Self-pay | Admitting: *Deleted

## 2018-04-08 MED ORDER — TRIAMCINOLONE ACETONIDE 55 MCG/ACT NA AERO: 2.0000 | INHALATION_SPRAY | Freq: Every day | NASAL | 3 refills | Status: DC

## 2018-04-08 MED ORDER — PHENYTOIN SODIUM EXTENDED 100 MG PO CAPS: 200.0000 mg | ORAL_CAPSULE | Freq: Every evening | ORAL | 3 refills | Status: DC

## 2018-04-08 MED ORDER — METHOCARBAMOL 500 MG PO TABS: 500.0000 mg | ORAL_TABLET | Freq: Four times a day (QID) | ORAL | 0 refills | Status: DC | PRN

## 2018-04-08 MED ORDER — LEVOCETIRIZINE DIHYDROCHLORIDE 5 MG PO TABS: 5.0000 mg | ORAL_TABLET | Freq: Every evening | ORAL | 3 refills | Status: DC

## 2018-04-08 MED ORDER — HYDROCHLOROTHIAZIDE 25 MG PO TABS: ORAL_TABLET | ORAL | 3 refills | Status: DC

## 2018-05-05 ENCOUNTER — Encounter: Payer: Self-pay | Admitting: Obstetrics and Gynecology

## 2018-05-05 ENCOUNTER — Other Ambulatory Visit: Payer: Self-pay

## 2018-05-05 ENCOUNTER — Telehealth: Payer: Self-pay | Admitting: Certified Nurse Midwife

## 2018-05-05 ENCOUNTER — Ambulatory Visit (INDEPENDENT_AMBULATORY_CARE_PROVIDER_SITE_OTHER): Payer: BC Managed Care – PPO | Admitting: Obstetrics and Gynecology

## 2018-05-05 ENCOUNTER — Telehealth: Payer: Self-pay | Admitting: Obstetrics and Gynecology

## 2018-05-05 VITALS — BP 128/70 | HR 66 | Resp 14 | Ht 62.0 in | Wt 166.4 lb

## 2018-05-05 DIAGNOSIS — Z113 Encounter for screening for infections with a predominantly sexual mode of transmission: Secondary | ICD-10-CM

## 2018-05-05 DIAGNOSIS — N766 Ulceration of vulva: Secondary | ICD-10-CM

## 2018-05-05 MED ORDER — LIDOCAINE 5 % EX OINT: 1.0000 "application " | TOPICAL_OINTMENT | Freq: Four times a day (QID) | CUTANEOUS | 0 refills | Status: DC | PRN

## 2018-05-05 MED ORDER — VALACYCLOVIR HCL 1 G PO TABS: 1000.0000 mg | ORAL_TABLET | Freq: Two times a day (BID) | ORAL | 0 refills | Status: DC

## 2018-05-05 NOTE — Telephone Encounter (Signed)
Patient has a few painful bumps on vaginal area.

## 2018-05-05 NOTE — Telephone Encounter (Signed)
Patient complaining of painful vaginal bumps for 4 days. She states they do not look like blisters but are very uncomfortable. No new partner, denies any fever. Made appointment with Dr.Jertson today at 1:45pm for evaluation.

## 2018-05-05 NOTE — Telephone Encounter (Signed)
Patient called asking if the medication that Dr. Talbert Nan prescribed for her could be put everywhere on her vagina or to only put it on the sores.

## 2018-05-05 NOTE — Progress Notes (Signed)
GYNECOLOGY  VISIT   HPI: 57 y.o.   Widowed  [place] American  female   [phone] with Patient's last menstrual period was 12/31/2004 (approximate).   here for c/o painful vaginal bumps. She started feeling itchy on Thursday of last week, she self treated for yeast. Then on Saturday she noticed a lump, very painful. Now has several painful lumps. Hurts to sit or wipe.  Today she noticed a tender lump under her right arm. Sexually active, same partner x 3.5 years. Neither have a h/o hsv.    GYNECOLOGIC HISTORY: Patient's last menstrual period was 12/31/2004 (approximate). Contraception: Hysterectomy  Menopausal hormone therapy: none         OB History    Gravida  4   Para  3   Term      Preterm      AB  1   Living  3     SAB      TAB      Ectopic  1   Multiple      Live Births  3              Patient Active Problem List   Diagnosis Date Noted  . Vitamin D deficiency 06/07/2017  . Spinal stenosis, lumbar region with neurogenic claudication 05/07/2017  . Allergic rhinitis 11/09/2015  . Essential hypertension 01/09/2015  . Grief reaction 01/09/2015  . Anxiety state 01/09/2015  . Adjustment disorder with mixed anxiety and depressed mood 01/09/2015  . Lumbago 01/09/2015  . B12 deficiency 05/10/2013  . Hyperlipidemia 09/15/2012  . Obese 05/22/2012  . Sciatica 05/22/2012  . Family history of breast cancer 05/01/2012  . History of convulsions 04/08/2012  . 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 Seizure disorder ([place]) 01/21/2012    Past Medical History:  Diagnosis Date  . Abnormal Pap smear of cervix   . Carpal tunnel syndrome of right wrist   . Elevated cholesterol   . Family history of adverse reaction to anesthesia    sister has naseau  . Hypertension   . Increased risk of breast cancer 2018   27.7 lifetime risk.  Yearly MRI of breast.   . Low back pain   . OAB (overactive bladder)   . Pneumonia   . Seizures ([place])     Past Surgical  History:  Procedure Laterality Date  . ABDOMINAL HYSTERECTOMY  06/23/2004   supracervical hysterectomy.  Ovaries remain.  Marland Kitchen CESAREAN SECTION    . CRYOTHERAPY    . ECTOPIC PREGNANCY SURGERY    . LUMBAR LAMINECTOMY/DECOMPRESSION MICRODISCECTOMY Left 05/07/2017   Procedure: Decompression L4-L5 and excision of synovial cyst Left;  Surgeon: Latanya Maudlin, MD;  Location: [facility];  Service: Orthopedics;  Laterality: Left;    Current Outpatient Medications  Medication Sig Dispense Refill  . BIOTIN PO Take by mouth.    . Cholecalciferol (VITAMIN D3) 2000 units capsule TAKE 1 CAPSULE BY MOUTH DAILY (Patient taking differently: twice weekly) 30 capsule 0  . ciprofloxacin (CILOXAN) 0.3 % ophthalmic solution Place 2 drops into both eyes 2 (two) times daily. As needed for irritation with contacts    . COLLAGEN PO Take by mouth.    . fluticasone (FLONASE) 50 MCG/ACT nasal spray fluticasone propionate 50 mcg/actuation nasal spray,suspension    . hydrochlorothiazide (HYDRODIURIL) 25 MG tablet Take 1/2 tablet once a day 45 tablet 3  . levocetirizine (XYZAL) 5 MG tablet Take 1 tablet (5 mg total) by mouth every evening. 90 tablet 3  . methocarbamol (ROBAXIN) 500  MG tablet Take 1 tablet (500 mg total) by mouth every 6 (six) hours as needed for muscle spasms. 180 tablet 0  . phenytoin (DILANTIN) 100 MG ER capsule Take 2 capsules (200 mg total) by mouth every evening. 180 capsule 3  . scopolamine (TRANSDERM-SCOP, 1.5 MG,) 1 MG/3DAYS Place 1 patch (1.5 mg total) onto the skin every 3 (three) days. 10 patch 1  . triamcinolone (NASACORT) 55 MCG/ACT AERO nasal inhaler Place 2 sprays into the nose daily. 3 Inhaler 3  . Sod Fluoride-Potassium Nitrate (PREVIDENT 5000 SENSITIVE) 1.1-5 % PSTE PreviDent 5000 Sensitive 1.1 %-5 % dental paste     No current facility-administered medications for this visit.      ALLERGIES: Patient has no known allergies.  Family History  Problem Relation Age of Onset  . Breast cancer  Mother 56       breast, recurrence 5 ys later to other breast then mastectomy  . Hypertension Mother   . Lung cancer Father 35       lung   . Diabetes Father   . Hypertension Father   . Breast cancer Sister 19       breast wih recurrence 10 yrs later and bilateral mastectomy  . Hypertension Sister     Social History   Socioeconomic History  . Marital status: Widowed    Spouse name: Not on file  . Number of children: Not on file  . Years of education: Not on file  . Highest education level: Not on file  Occupational History  . Not on file  Social Needs  . Financial resource strain: Not on file  . Food insecurity:    Worry: Not on file    Inability: Not on file  . Transportation needs:    Medical: Not on file    Non-medical: Not on file  Tobacco Use  . Smoking status: Former Smoker    Types: Cigarettes    Last attempt to quit: 12/31/1996    Years since quitting: 21.3  . Smokeless tobacco: Never Used  Substance and Sexual Activity  . Alcohol use: Yes    Alcohol/week: 0.6 - 1.2 oz    Types: 1 - 2 Standard drinks or equivalent per week  . Drug use: No  . Sexual activity: Yes    Birth control/protection: Surgical, Post-menopausal    Comment: hysterectomy  Lifestyle  . Physical activity:    Days per week: Not on file    Minutes per session: Not on file  . Stress: Not on file  Relationships  . Social connections:    Talks on phone: Not on file    Gets together: Not on file    Attends religious service: Not on file    Active member of club or organization: Not on file    Attends meetings of clubs or organizations: Not on file    Relationship status: Not on file  . Intimate partner violence:    Fear of current or ex partner: Not on file    Emotionally abused: Not on file    Physically abused: Not on file    Forced sexual activity: Not on file  Other Topics Concern  . Not on file  Social History Narrative  . Not on file    Review of Systems  Constitutional:  Negative.   HENT: Negative.   Eyes: Negative.   Respiratory: Negative.   Cardiovascular: Negative.   Gastrointestinal: Negative.   Genitourinary:       Vulvar/vaginal lumps  Musculoskeletal: Negative.   Skin: Negative.   Neurological: Negative.   Endo/Heme/Allergies: Negative.   Psychiatric/Behavioral: Negative.     PHYSICAL EXAMINATION:    BP 128/70 (BP Location: Right Arm, Patient Position: Sitting, Cuff Size: Normal)   Pulse 66   Resp 14   Ht 5\' 2"  (1.575 m)   Wt 166 lb 6.4 oz (75.5 kg)   LMP 12/31/2004 (Approximate)   BMI 30.43 kg/m     General appearance: alert, cooperative and appears stated age Right axilla: small tender subcutaneous lump  Pelvic: External genitalia: multiple vulvar ulcers, one on the clitoris, a few on either labia majora.               Urethra:  normal appearing urethra with no masses, tenderness or lesions              Bartholins and Skenes: normal                 Vagina: small ulcer noted posteriorly a few cm from the introitus              Cervix: no lesions               Chaperone was present for exam.  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  CC: Evalee Mutton, CNM

## 2018-05-05 NOTE — Telephone Encounter (Signed)
Patient called wanting to know if okay to use Lidocaine ointment on ulcer at clitoris? Advised patient okay to use, but if she noticed worsening irritation from the ointment in that area, I wouldn't continue applying on clitoral ulcer. But hopefully this will give her some relief.She thanked me for returning her call.

## 2018-05-05 NOTE — Patient Instructions (Signed)

## 2018-05-06 LAB — HEP, RPR, HIV PANEL
HIV Screen 4th Generation wRfx: NONREACTIVE
Hepatitis B Surface Ag: NEGATIVE
RPR Ser Ql: NONREACTIVE

## 2018-05-06 LAB — HEPATITIS C ANTIBODY

## 2018-05-06 LAB — GC/CHLAMYDIA PROBE AMP
Chlamydia trachomatis, NAA: NEGATIVE
Neisseria gonorrhoeae by PCR: NEGATIVE

## 2018-05-07 LAB — HERPES SIMPLEX VIRUS CULTURE

## 2018-05-08 ENCOUNTER — Encounter: Payer: Self-pay | Admitting: Obstetrics and Gynecology

## 2018-05-08 ENCOUNTER — Telehealth: Payer: Self-pay | Admitting: Certified Nurse Midwife

## 2018-05-08 NOTE — Telephone Encounter (Signed)
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 HSV 1 is more commonly associated with oral HSV, but can be in the genital region as well. See how she is feeling and check if she has any questions.

## 2018-05-08 NOTE — Telephone Encounter (Signed)
Spoke with patient, advised of results as seen below per Dr. Talbert Nan. Patient reports symptoms are improving, not as painful.  Patient has additional questions regarding HSV, testing, future treatment, prevention. Recommended OV with provider for further discussion. OV scheduled for 5/14 at 2:15pm with Dr. Talbert Nan.  Routing to provider for final review. Patient is agreeable to disposition. Will close encounter.

## 2018-05-08 NOTE — Telephone Encounter (Signed)
Patient called requesting results.  

## 2018-05-09 ENCOUNTER — Telehealth: Payer: Self-pay | Admitting: Obstetrics and Gynecology

## 2018-05-09 NOTE — Telephone Encounter (Signed)
Patient sent the following correspondence through [facility]. Routing to triage to assist patient with request.  ----- Message from [place], Generic sent at 05/08/2018 10:52 PM EDT -----    I forgot to show you this spot on my hand. Its red & tender. Is it related?

## 2018-05-09 NOTE — Telephone Encounter (Signed)
Spoke with patient. Patient states that she has a red, tender, fluid filled bump on her hand. Asking if this could be related to HSV. Advised this could be related if her hand came in contact with the lesions. Patient is taking Valtrex as directed. Advised to continue taking rx to clear lesions. Advised to be very careful with contact to the area. Wash hands well. Patient is coming to the office to discuss results on 5/14. Advised we can assess the area at that time as well. Patient is agreeable.  Routing to provider for final review. Patient agreeable to disposition. Will close encounter.

## 2018-05-13 ENCOUNTER — Ambulatory Visit (INDEPENDENT_AMBULATORY_CARE_PROVIDER_SITE_OTHER): Payer: BC Managed Care – PPO | Admitting: Obstetrics and Gynecology

## 2018-05-13 ENCOUNTER — Other Ambulatory Visit: Payer: Self-pay

## 2018-05-13 ENCOUNTER — Encounter: Payer: Self-pay | Admitting: Obstetrics and Gynecology

## 2018-05-13 VITALS — BP 150/86 | HR 60 | Resp 14 | Wt 165.0 lb

## 2018-05-13 DIAGNOSIS — S60521A Blister (nonthermal) of right hand, initial encounter: Secondary | ICD-10-CM | POA: Diagnosis not present

## 2018-05-13 DIAGNOSIS — A6000 Herpesviral infection of urogenital system, unspecified: Secondary | ICD-10-CM | POA: Diagnosis not present

## 2018-05-13 MED ORDER — VALACYCLOVIR HCL 500 MG PO TABS: ORAL_TABLET | ORAL | 1 refills | Status: DC

## 2018-05-13 MED ORDER — LIDOCAINE 5 % EX OINT: 1.0000 "application " | TOPICAL_OINTMENT | Freq: Four times a day (QID) | CUTANEOUS | 0 refills | Status: DC | PRN

## 2018-05-13 NOTE — Progress Notes (Signed)
GYNECOLOGY  VISIT   HPI: 57 y.o.   Widowed  [place] American  female   [phone] with Patient's last menstrual period was 12/31/2004 (approximate).   here to discuss her recent lab result. She notices a red blister on her right hand she questions rather it could be related to HSV. The patient was seen last week with vulvar ulcers. The culture returned with HSV 1.  She was treated with Valtrex.  She still feels a little sore between her vagina and rectum, no lesions in that area. She noticed a blister on her right hand, it was very sore, she popped it   GYNECOLOGIC HISTORY: Patient's last menstrual period was 12/31/2004 (approximate). Contraception: hysterectomy  Menopausal hormone therapy: none        OB History    Gravida  4   Para  3   Term      Preterm      AB  1   Living  3     SAB      TAB      Ectopic  1   Multiple      Live Births  3              Patient Active Problem List   Diagnosis Date Noted  . Vitamin D deficiency 06/07/2017  . Spinal stenosis, lumbar region with neurogenic claudication 05/07/2017  . Allergic rhinitis 11/09/2015  . Essential hypertension 01/09/2015  . Grief reaction 01/09/2015  . Anxiety state 01/09/2015  . Adjustment disorder with mixed anxiety and depressed mood 01/09/2015  . Lumbago 01/09/2015  . B12 deficiency 05/10/2013  . Hyperlipidemia 09/15/2012  . Obese 05/22/2012  . Sciatica 05/22/2012  . Family history of breast cancer 05/01/2012  . History of convulsions 04/08/2012  . 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 Seizure disorder ([place]) 01/21/2012    Past Medical History:  Diagnosis Date  . Abnormal Pap smear of cervix   . Carpal tunnel syndrome of right wrist   . Elevated cholesterol   . Family history of adverse reaction to anesthesia    sister has naseau  . Hypertension   . Increased risk of breast cancer 2018   27.7 lifetime risk.  Yearly MRI of breast.   . Low back pain   . OAB (overactive  bladder)   . Pneumonia   . Seizures ([place])     Past Surgical History:  Procedure Laterality Date  . ABDOMINAL HYSTERECTOMY  06/23/2004   supracervical hysterectomy.  Ovaries remain.  Marland Kitchen CESAREAN SECTION    . CRYOTHERAPY    . ECTOPIC PREGNANCY SURGERY    . LUMBAR LAMINECTOMY/DECOMPRESSION MICRODISCECTOMY Left 05/07/2017   Procedure: Decompression L4-L5 and excision of synovial cyst Left;  Surgeon: Latanya Maudlin, MD;  Location: [facility];  Service: Orthopedics;  Laterality: Left;    Current Outpatient Medications  Medication Sig Dispense Refill  . BIOTIN PO Take by mouth.    . Cholecalciferol (VITAMIN D3) 2000 units capsule TAKE 1 CAPSULE BY MOUTH DAILY (Patient taking differently: twice weekly) 30 capsule 0  . ciprofloxacin (CILOXAN) 0.3 % ophthalmic solution Place 2 drops into both eyes 2 (two) times daily. As needed for irritation with contacts    . COLLAGEN PO Take by mouth.    . fluticasone (FLONASE) 50 MCG/ACT nasal spray fluticasone propionate 50 mcg/actuation nasal spray,suspension    . hydrochlorothiazide (HYDRODIURIL) 25 MG tablet Take 1/2 tablet once a day 45 tablet 3  . levocetirizine (XYZAL) 5 MG tablet Take 1 tablet (5  mg total) by mouth every evening. 90 tablet 3  . lidocaine (XYLOCAINE) 5 % ointment Apply 1 application topically 4 (four) times daily as needed. 30 g 0  . methocarbamol (ROBAXIN) 500 MG tablet Take 1 tablet (500 mg total) by mouth every 6 (six) hours as needed for muscle spasms. 180 tablet 0  . phenytoin (DILANTIN) 100 MG ER capsule Take 2 capsules (200 mg total) by mouth every evening. 180 capsule 3  . scopolamine (TRANSDERM-SCOP, 1.5 MG,) 1 MG/3DAYS Place 1 patch (1.5 mg total) onto the skin every 3 (three) days. 10 patch 1  . Sod Fluoride-Potassium Nitrate (PREVIDENT 5000 SENSITIVE) 1.1-5 % PSTE PreviDent 5000 Sensitive 1.1 %-5 % dental paste    . triamcinolone (NASACORT) 55 MCG/ACT AERO nasal inhaler Place 2 sprays into the nose daily. 3 Inhaler 3  .  valACYclovir (VALTREX) 1000 MG tablet Take 1 tablet (1,000 mg total) by mouth 2 (two) times daily. Take for 10 days 20 tablet 0   No current facility-administered medications for this visit.      ALLERGIES: Patient has no known allergies.  Family History  Problem Relation Age of Onset  . Breast cancer Mother 5       breast, recurrence 5 ys later to other breast then mastectomy  . Hypertension Mother   . Lung cancer Father 51       lung   . Diabetes Father   . Hypertension Father   . Breast cancer Sister 62       breast wih recurrence 10 yrs later and bilateral mastectomy  . Hypertension Sister     Social History   Socioeconomic History  . Marital status: Widowed    Spouse name: Not on file  . Number of children: Not on file  . Years of education: Not on file  . Highest education level: Not on file  Occupational History  . Not on file  Social Needs  . Financial resource strain: Not on file  . Food insecurity:    Worry: Not on file    Inability: Not on file  . Transportation needs:    Medical: Not on file    Non-medical: Not on file  Tobacco Use  . Smoking status: Former Smoker    Types: Cigarettes    Last attempt to quit: 12/31/1996    Years since quitting: 21.3  . Smokeless tobacco: Never Used  Substance and Sexual Activity  . Alcohol use: Yes    Alcohol/week: 0.6 - 1.2 oz    Types: 1 - 2 Standard drinks or equivalent per week  . Drug use: No  . Sexual activity: Yes    Birth control/protection: Surgical, Post-menopausal    Comment: hysterectomy  Lifestyle  . Physical activity:    Days per week: Not on file    Minutes per session: Not on file  . Stress: Not on file  Relationships  . Social connections:    Talks on phone: Not on file    Gets together: Not on file    Attends religious service: Not on file    Active member of club or organization: Not on file    Attends meetings of clubs or organizations: Not on file    Relationship status: Not on file  .  Intimate partner violence:    Fear of current or ex partner: Not on file    Emotionally abused: Not on file    Physically abused: Not on file    Forced sexual activity: Not on file  Other Topics Concern  . Not on file  Social History Narrative  . Not on file    Review of Systems  HENT: Negative.   Eyes: Negative.   Respiratory: Negative.   Cardiovascular: Negative.   Gastrointestinal: Negative.   Genitourinary:       Vaginal itching   Musculoskeletal: Negative.   Skin:       Red area on right hand   Neurological: Negative.   Endo/Heme/Allergies: Negative.   Psychiatric/Behavioral: Negative.     PHYSICAL EXAMINATION:    BP (!) 150/86 (BP Location: Right Arm, Patient Position: Sitting, Cuff Size: Normal)   Pulse 60   Resp 14   Wt 165 lb (74.8 kg)   LMP 12/31/2004 (Approximate)   BMI 30.18 kg/m     General appearance: alert, cooperative and appears stated age Right hand: healing blister.  Pelvic: External genitalia:  no lesions, ulcerations are all healing              Urethra:  normal appearing urethra with no masses, tenderness or lesions              Bartholins and Skenes: normal                  Chaperone was present for exam.  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 One more day of Valtrex, feeling so much better Healing blister on her right hand (picture looked like HSV blister)    PLAN Discussed HSV, answered her questions Will send a PCR probe for HSV from the lesion on her hand Valtrex for recurrent outbreaks was sent to the pharmacy Her partner is going to get tested for HSV We discussed the option of suppression Minimal focal vulvar soreness, normal exam, she can use Vaseline   An After Visit Summary was printed and given to the patient.  ~15 minutes face to face time of which over 50% was spent in counseling.

## 2018-05-19 ENCOUNTER — Telehealth: Payer: Self-pay | Admitting: Obstetrics and Gynecology

## 2018-05-19 NOTE — Telephone Encounter (Signed)
Spoke with patient. Advised lab is still being processed and she will be contacted as soon as it returns. Patient verbalizes understanding.  Routing to provider for final review. Patient agreeable to disposition. Will close encounter.

## 2018-05-19 NOTE — Telephone Encounter (Signed)
Patient checking on the status of test results.

## 2018-05-20 ENCOUNTER — Encounter: Payer: Self-pay | Admitting: Obstetrics and Gynecology

## 2018-05-20 ENCOUNTER — Telehealth: Payer: Self-pay | Admitting: *Deleted

## 2018-05-20 LAB — HSV NAA
HSV 1 NAA: POSITIVE — AB
HSV 2 NAA: POSITIVE — AB

## 2018-05-20 NOTE — Telephone Encounter (Signed)
Spoke with patient. Reports external vulvar itching. 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 Denies vaginal d/c, odor, bleeding. No HSV lesions present.   Coconut oil previously recommended, just started using last night. Patient unsure if providing relief yet.  Offered OV for further evaluation. Patient request to schedule on 5/24 and continue using coconut oil to see if it resolves the itching. OV scheduled for 5/24 at 12:45pm with Melvia Heaps, CNM. Advised patient to return call for earlier OV if new symptoms develop or symptoms worsen.   Routing to provider for final review. Patient is agreeable to disposition. Will close encounter.  Cc: Melvia Heaps, CNM

## 2018-05-20 NOTE — Telephone Encounter (Signed)
See telephone encounter dated 05/20/18.

## 2018-05-20 NOTE — Telephone Encounter (Signed)
Patient sent the following message through [facility]. Routing to triage to assist patient with request.   ----- Message from [place], Generic sent at 05/20/2018 6:29 AM EDT -----    My vagina still feels itchy or not right. Should I take more medicine?

## 2018-05-22 ENCOUNTER — Telehealth: Payer: Self-pay

## 2018-05-22 NOTE — Telephone Encounter (Signed)
Spoke with patient. Advised of results as seen below from [place]. Patient states that she still has the area on her hand. Is smaller and started to dry out. Previously took Valtrex for 10 days with initial signs of vaginal outbreak. Asking if Dr.Jertson think she should take Valtrex BID x 3 days at this time to help completely clear lesion on hand.

## 2018-05-22 NOTE — Telephone Encounter (Signed)
I don't think it would probably help at this time.

## 2018-05-22 NOTE — Telephone Encounter (Signed)
-----   Message from Salvadore Dom, MD sent at 05/20/2018  7:55 PM EDT ----- Can you please confirm this with the lab. 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 This is a sample from her hand and returned + for both. Please inform the patient that the HSV culture from her hand returned as + for both strains of the virus. Advise her to be careful if she gets another blister on her hand to try not to transmit the virus. Cover it, avoid direct contact of the blister with another persons skin.

## 2018-05-23 ENCOUNTER — Ambulatory Visit: Payer: Self-pay | Admitting: Certified Nurse Midwife

## 2018-05-23 NOTE — Telephone Encounter (Signed)
Spoke with patient. Advised of message as seen below from Dr.Jertson. Patient verbalizes understanding. Encounter closed. 

## 2018-06-13 ENCOUNTER — Other Ambulatory Visit: Payer: Self-pay

## 2018-06-13 ENCOUNTER — Encounter: Payer: Self-pay | Admitting: Family Medicine

## 2018-06-13 ENCOUNTER — Ambulatory Visit (INDEPENDENT_AMBULATORY_CARE_PROVIDER_SITE_OTHER): Payer: BC Managed Care – PPO | Admitting: Family Medicine

## 2018-06-13 VITALS — BP 132/68 | HR 64 | Temp 98.7°F | Resp 14 | Ht 61.75 in | Wt 167.0 lb

## 2018-06-13 DIAGNOSIS — I10 Essential (primary) hypertension: Secondary | ICD-10-CM | POA: Diagnosis not present

## 2018-06-13 DIAGNOSIS — E785 Hyperlipidemia, unspecified: Secondary | ICD-10-CM | POA: Diagnosis not present

## 2018-06-13 DIAGNOSIS — E559 Vitamin D deficiency, unspecified: Secondary | ICD-10-CM | POA: Diagnosis not present

## 2018-06-13 DIAGNOSIS — E538 Deficiency of other specified B group vitamins: Secondary | ICD-10-CM

## 2018-06-13 LAB — LIPID PANEL
CHOL/HDL RATIO: 4 (calc) (ref <5.0)
CHOLESTEROL: 281 mg/dL — AB (ref <200)
HDL: 71 mg/dL (ref >50)
LDL CHOLESTEROL (CALC): 190 mg/dL — AB
Non-HDL Cholesterol (Calc): 210 mg/dL (calc) — ABNORMAL HIGH (ref <130)
Triglycerides: 87 mg/dL (ref <150)

## 2018-06-13 NOTE — Patient Instructions (Addendum)
Take B12 1064mcg once a day  Continue other vitamins  F/u 6 MONTH

## 2018-06-13 NOTE — Progress Notes (Signed)
   Subjective:    Patient ID: Lisa [name], female    DOB: 08-18-1961, 57 y.o.   MRN: [id number]  Patient presents for Follow-up (is fasting)  Pt here to f/u chronic medical   Has been doing some Ketogenic, but has been off a little. Has a cruise next week, will return to strict keto afterwards  Lowest weight with this diet to 161lbs  Had bloodwork with neurology, had medications done due to hair loss, B12 and B6 were very low  now on Super B complex and Biotin  Has appt with Mr. 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 Has new contact brand  Dr. Jorja Loa   CMET- normal   CBC- Normal 5/21 Vitamin D normal B12 defeu Due for repeat Lipid    Review Of Systems:  GEN- denies fatigue, fever, weight loss,weakness, recent illness HEENT- denies eye drainage, change in vision, nasal discharge, CVS- denies chest pain, palpitations RESP- denies SOB, cough, wheeze ABD- denies N/V, change in stools, abd pain GU- denies dysuria, hematuria, dribbling, incontinence MSK- denies joint pain, muscle aches, injury Neuro- denies headache, dizziness, syncope, seizure activity       Objective:    BP 132/68   Pulse 64   Temp 98.7 F (37.1 C) (Oral)   Resp 14   Ht 5' 1.75" (1.568 m)   Wt 167 lb (75.8 kg)   LMP 12/31/2004 (Approximate)   SpO2 99%   BMI 30.79 kg/m  GEN- NAD, alert and oriented x3 HEENT- PERRL, EOMI, non injected sclera, pink conjunctiva, MMM, oropharynx clear Neck- Supple, no thyromegaly CVS- RRR, no murmur RESP-CTAB ABD-NABS,soft,NT,ND EXT- No edema Pulses- Radial, DP- 2+        Assessment & Plan:      Problem List Items Addressed This Visit      Unprioritized   B12 deficiency    Reviewed recently levels, can continue OTC supplement at goal       Essential  hypertension - Primary    Well controlled, continues to work on diet and weight loss, she is at least maintaining the weight she did lose Recheck lipids, if still quite elevated recommend restarting statin drug       Hyperlipidemia   Relevant Orders   Lipid panel (Completed)   Vitamin D deficiency      Note: This dictation was prepared with Dragon dictation along with smaller phrase technology. Any transcriptional errors that result from this process are unintentional.

## 2018-06-16 ENCOUNTER — Other Ambulatory Visit: Payer: Self-pay | Admitting: *Deleted

## 2018-06-16 ENCOUNTER — Encounter: Payer: Self-pay | Admitting: Family Medicine

## 2018-06-16 MED ORDER — ATORVASTATIN CALCIUM 10 MG PO TABS: 10.0000 mg | ORAL_TABLET | Freq: Every day | ORAL | 3 refills | Status: DC

## 2018-06-16 NOTE — Assessment & Plan Note (Signed)
Well controlled, continues to work on diet and weight loss, she is at least maintaining the weight she did lose Recheck lipids, if still quite elevated recommend restarting statin drug

## 2018-06-16 NOTE — Assessment & Plan Note (Signed)
Reviewed recently levels, can continue OTC supplement at goal

## 2018-07-15 ENCOUNTER — Encounter: Payer: Self-pay | Admitting: Family Medicine

## 2018-07-15 MED ORDER — HYDROCHLOROTHIAZIDE 25 MG PO TABS: ORAL_TABLET | ORAL | 3 refills | Status: DC

## 2018-07-16 MED ORDER — HYDROCHLOROTHIAZIDE 25 MG PO TABS: ORAL_TABLET | ORAL | 3 refills | Status: DC

## 2018-07-16 NOTE — Addendum Note (Signed)
Addended by: Sheral Flow on: 07/16/2018 08:23 AM   Modules accepted: Orders

## 2018-08-28 DIAGNOSIS — G5602 Carpal tunnel syndrome, left upper limb: Secondary | ICD-10-CM | POA: Insufficient documentation

## 2018-09-02 ENCOUNTER — Encounter: Payer: Self-pay | Admitting: Obstetrics and Gynecology

## 2018-09-02 ENCOUNTER — Telehealth: Payer: Self-pay | Admitting: Obstetrics and Gynecology

## 2018-09-02 NOTE — Telephone Encounter (Signed)
Please let the patient know that it is possible to have the HSV virus for many years without ever having an outbreak. She could have had this for 20 + years. If she has more questions, please have her set up an appointment to discuss.

## 2018-09-02 NOTE — Telephone Encounter (Signed)
Dr. Talbert Nan,  Can you review this message. Does she need an office visit to discuss this further?

## 2018-09-02 NOTE — Telephone Encounter (Signed)
Good morning. 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 My partner was tested & received a negative diagnosis. Is that possible? If so how could have I contracted it?

## 2018-09-03 NOTE — Telephone Encounter (Signed)
Return call to pt and message from Dr. Talbert Nan given.  Pt verbalized understanding and will call back with any further questions.   Encounter closed.

## 2018-09-23 ENCOUNTER — Encounter: Payer: Self-pay | Admitting: Family Medicine

## 2018-09-23 MED ORDER — PHENYTOIN SODIUM EXTENDED 100 MG PO CAPS: 200.0000 mg | ORAL_CAPSULE | Freq: Every evening | ORAL | 3 refills | Status: DC

## 2018-10-23 ENCOUNTER — Encounter: Payer: Self-pay | Admitting: Family Medicine

## 2018-10-23 ENCOUNTER — Ambulatory Visit (INDEPENDENT_AMBULATORY_CARE_PROVIDER_SITE_OTHER): Payer: BC Managed Care – PPO | Admitting: Family Medicine

## 2018-10-23 VITALS — BP 132/80 | HR 64 | Temp 98.5°F | Resp 16 | Ht 61.75 in | Wt 168.0 lb

## 2018-10-23 DIAGNOSIS — G5601 Carpal tunnel syndrome, right upper limb: Secondary | ICD-10-CM | POA: Diagnosis not present

## 2018-10-23 MED ORDER — DICLOFENAC SODIUM 75 MG PO TBEC: 75.0000 mg | DELAYED_RELEASE_TABLET | Freq: Two times a day (BID) | ORAL | 0 refills | Status: DC

## 2018-10-23 NOTE — Progress Notes (Signed)
Subjective:    Patient ID: Lisa [name], female    DOB: 1961-02-18, 57 y.o.   MRN: [id number]  HPI  Patient presents today requesting a cortisone injection for carpal tunnel syndrome.  Apparently she has been seeing orthopedics, Dr.Ortman, who has been performing cortisone injections for carpal tunnel syndrome.  She states that her last cortisone injection in her right wrist was approximately 2 months ago.  It is not been a full 3 months.  She reports pain in her hand radiating up and down her arm.  She reports numbness and tingling in her hand.  It affects primarily the first second and third digits.  She states the pain is severe.  I have asked the patient why she has not had surgery and she states that there is no guarantee surgery would work and therefore she is reluctant to have surgery.  I explained to the patient that I feel uncomfortable injecting the carpal tunnel frequently due to the risk of tendon damage.  She has had a cortisone injection just 2 months ago.  It has not been a full 3 months.  Furthermore it sounds like she has had multiple injections in this area per her report.  Therefore explained to the patient my concerns about possible tendon injury.  Also explained to the patient that I feel this is not her best long-term option.  I recommended that she discuss with Dr. Apolonio Schneiders possibly proceeding with surgery.   Past Medical History:  Diagnosis Date  . Abnormal Pap smear of cervix   . Carpal tunnel syndrome of right wrist   . Elevated cholesterol   . Family history of adverse reaction to anesthesia    sister has naseau  . Hypertension   . Increased risk of breast cancer 2018   27.7 lifetime risk.  Yearly MRI of breast.   . Low back pain   . OAB (overactive bladder)   . Pneumonia   . Seizures ([place])    Past Surgical History:  Procedure Laterality Date  . ABDOMINAL HYSTERECTOMY  06/23/2004   supracervical hysterectomy.  Ovaries remain.  Marland Kitchen CESAREAN SECTION    .  CRYOTHERAPY    . ECTOPIC PREGNANCY SURGERY    . LUMBAR LAMINECTOMY/DECOMPRESSION MICRODISCECTOMY Left 05/07/2017   Procedure: Decompression L4-L5 and excision of synovial cyst Left;  Surgeon: Latanya Maudlin, MD;  Location: [facility];  Service: Orthopedics;  Laterality: Left;   Current Outpatient Medications on File Prior to Visit  Medication Sig Dispense Refill  . atorvastatin (LIPITOR) 10 MG tablet Take 1 tablet (10 mg total) by mouth daily. 90 tablet 3  . BIOTIN PO Take by mouth.    . Cholecalciferol (VITAMIN D3) 2000 units capsule TAKE 1 CAPSULE BY MOUTH DAILY (Patient taking differently: twice weekly) 30 capsule 0  . ciprofloxacin (CILOXAN) 0.3 % ophthalmic solution Place 2 drops into both eyes 2 (two) times daily. As needed for irritation with contacts    . COLLAGEN PO Take by mouth.    . fluticasone (FLONASE) 50 MCG/ACT nasal spray fluticasone propionate 50 mcg/actuation nasal spray,suspension    . hydrochlorothiazide (HYDRODIURIL) 25 MG tablet Take 1/2 tablet once a day 45 tablet 3  . levocetirizine (XYZAL) 5 MG tablet Take 1 tablet (5 mg total) by mouth every evening. 90 tablet 3  . lidocaine (XYLOCAINE) 5 % ointment Apply 1 application topically 4 (four) times daily as needed. 30 g 0  . phenytoin (DILANTIN) 100 MG ER capsule Take 2 capsules (200 mg total) by mouth  every evening. 180 capsule 3  . scopolamine (TRANSDERM-SCOP, 1.5 MG,) 1 MG/3DAYS Place 1 patch (1.5 mg total) onto the skin every 3 (three) days. 10 patch 1  . Sod Fluoride-Potassium Nitrate (PREVIDENT 5000 SENSITIVE) 1.1-5 % PSTE PreviDent 5000 Sensitive 1.1 %-5 % dental paste    . triamcinolone (NASACORT) 55 MCG/ACT AERO nasal inhaler Place 2 sprays into the nose daily. 3 Inhaler 3  . valACYclovir (VALTREX) 500 MG tablet 1 tablet BID x 3 days as needed. 30 tablet 1   No current facility-administered medications on file prior to visit.    No Known Allergies Social History   Socioeconomic History  . Marital status: Widowed      Spouse name: Not on file  . Number of children: Not on file  . Years of education: Not on file  . Highest education level: Not on file  Occupational History  . Not on file  Social Needs  . Financial resource strain: Not on file  . Food insecurity:    Worry: Not on file    Inability: Not on file  . Transportation needs:    Medical: Not on file    Non-medical: Not on file  Tobacco Use  . Smoking status: Former Smoker    Types: Cigarettes    Last attempt to quit: 12/31/1996    Years since quitting: 21.8  . Smokeless tobacco: Never Used  Substance and Sexual Activity  . Alcohol use: Yes    Alcohol/week: 1.0 - 2.0 standard drinks    Types: 1 - 2 Standard drinks or equivalent per week  . Drug use: No  . Sexual activity: Yes    Birth control/protection: Surgical, Post-menopausal    Comment: hysterectomy  Lifestyle  . Physical activity:    Days per week: Not on file    Minutes per session: Not on file  . Stress: Not on file  Relationships  . Social connections:    Talks on phone: Not on file    Gets together: Not on file    Attends religious service: Not on file    Active member of club or organization: Not on file    Attends meetings of clubs or organizations: Not on file    Relationship status: Not on file  . Intimate partner violence:    Fear of current or ex partner: Not on file    Emotionally abused: Not on file    Physically abused: Not on file    Forced sexual activity: Not on file  Other Topics Concern  . Not on file  Social History Narrative  . Not on file     Review of Systems  All other systems reviewed and are negative.      Objective:   Physical Exam  Musculoskeletal:       Right wrist: She exhibits normal range of motion, no tenderness, no bony tenderness, no swelling and no deformity.       Right hand: She exhibits normal range of motion and no deformity. Decreased sensation noted. Decreased sensation is present in the medial distribution.   Vitals reviewed.         Assessment & Plan:  Carpal tunnel syndrome, right wrist.  Very limited exam was performed today.  Patient already knows her diagnosis and requested a cortisone injection.  Instead I spent 15 minutes with the patient discussing the risk and benefits of repeated cortisone injections adjacent to tendons.  I explained that there is the risk of tendon rupture due to repeated  cortisone injections.  I was concerned given that it has been less than 3 months and she has received a couple of injections in that area already.  I recommended that she follow back up with her orthopedist to discuss surgical correction.  Patient became tearful.  She states that the pain is bad.  Therefore I recommended an anti-inflammatory Voltaren 75 mg p.o. twice daily.  I offered to perform the cortisone injection today however I have reservations as I explained to her and therefore she elects not to proceed with a cortisone injection

## 2018-11-11 ENCOUNTER — Encounter: Payer: Self-pay | Admitting: Family Medicine

## 2018-11-11 MED ORDER — PHENYTOIN SODIUM EXTENDED 100 MG PO CAPS: 200.0000 mg | ORAL_CAPSULE | Freq: Every evening | ORAL | 3 refills | Status: DC

## 2018-11-11 MED ORDER — HYDROCHLOROTHIAZIDE 25 MG PO TABS: ORAL_TABLET | ORAL | 3 refills | Status: DC

## 2018-11-11 MED ORDER — CETIRIZINE HCL 10 MG PO TABS: 10.0000 mg | ORAL_TABLET | Freq: Every day | ORAL | 3 refills | Status: DC

## 2018-12-12 ENCOUNTER — Other Ambulatory Visit: Payer: Self-pay

## 2018-12-12 ENCOUNTER — Encounter: Payer: Self-pay | Admitting: Family Medicine

## 2018-12-12 ENCOUNTER — Ambulatory Visit (INDEPENDENT_AMBULATORY_CARE_PROVIDER_SITE_OTHER): Payer: BC Managed Care – PPO | Admitting: Family Medicine

## 2018-12-12 VITALS — BP 128/64 | HR 82 | Temp 98.5°F | Resp 12 | Ht 61.5 in | Wt 170.0 lb

## 2018-12-12 DIAGNOSIS — E785 Hyperlipidemia, unspecified: Secondary | ICD-10-CM | POA: Diagnosis not present

## 2018-12-12 DIAGNOSIS — I10 Essential (primary) hypertension: Secondary | ICD-10-CM

## 2018-12-12 DIAGNOSIS — Z23 Encounter for immunization: Secondary | ICD-10-CM | POA: Diagnosis not present

## 2018-12-12 DIAGNOSIS — E559 Vitamin D deficiency, unspecified: Secondary | ICD-10-CM

## 2018-12-12 DIAGNOSIS — Z6831 Body mass index (BMI) 31.0-31.9, adult: Secondary | ICD-10-CM

## 2018-12-12 DIAGNOSIS — E6609 Other obesity due to excess calories: Secondary | ICD-10-CM

## 2018-12-12 DIAGNOSIS — E538 Deficiency of other specified B group vitamins: Secondary | ICD-10-CM

## 2018-12-12 NOTE — Assessment & Plan Note (Signed)
Check her B12 level if she is still borderline low then we will start B12 injections to boost her.

## 2018-12-12 NOTE — Assessment & Plan Note (Signed)
Lipids as well as liver function test.  Continue the Lipitor

## 2018-12-12 NOTE — Progress Notes (Signed)
   Subjective:    Patient ID: Lisa [name], female    DOB: 1961-08-29, 57 y.o.   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  She has been maintaining her weight loss.  She does have hyperlipidemia which did improve as her ketogenesis settled.  She is due for repeat.  She is on the fish oil and the Lipitor 10 mg once a day.  Hypertension she is taking her blood pressure medicine as prescribed without any side effects of the medication.  B12 deficiency she is due for repeat B12 level she is currently taking at thousand milligrams by mouth.  Of note her hair follicles have started to grow back in.  She is still awaiting her dermatology visit with [facility] in April.     Review Of Systems:  GEN- denies fatigue, fever, weight loss,weakness, recent illness HEENT- denies eye drainage, change in vision, nasal discharge, CVS- denies chest pain, palpitations RESP- denies SOB, cough, wheeze ABD- denies N/V, change in stools, abd pain GU- denies dysuria, hematuria, dribbling, incontinence MSK- denies joint pain, muscle aches, injury Neuro- denies headache, dizziness, syncope, seizure activity       Objective:    BP 128/64   Pulse 82   Temp 98.5 F (36.9 C) (Oral)   Resp 12   Ht 5' 1.5" (1.562 m)   Wt 170 lb (77.1 kg)   LMP 12/31/2004 (Approximate)   SpO2 95%   BMI 31.60 kg/m  GEN- NAD, alert and oriented x3 HEENT- PERRL, EOMI, non injected sclera, pink conjunctiva, MMM, oropharynx clear Neck- Supple, no thyromegaly CVS- RRR, no murmur RESP-CTAB ABD-NABS,soft,NT,ND EXT- No edema Pulses- Radial, DP- 2+        Assessment & Plan:      Problem List Items Addressed This Visit      Unprioritized   B12 deficiency    Check her B12 level if she is still borderline low then we will  start B12 injections to boost her.      Relevant Orders   Vitamin B12   Essential hypertension - Primary    Blood pressures well controlled no change in medications.      Relevant Orders   CBC with Differential/Platelet   Comprehensive metabolic panel   Hyperlipidemia    Lipids as well as liver function test.  Continue the Lipitor      Relevant Orders   Lipid panel   Obese    He is continue with her ketogenic diet which she felt she is able to maintain.  We also discussed adding aerobic exercise to help move her weight loss along further.      Vitamin D deficiency   Relevant Orders   Vitamin D, 25-hydroxy    Other Visit Diagnoses    Need for immunization against influenza       Relevant Orders   Flu Vaccine QUAD 36+ mos IM (Completed)      Note: This dictation was prepared with Dragon dictation along with smaller phrase technology. Any transcriptional errors that result from this process are unintentional.

## 2018-12-12 NOTE — Patient Instructions (Signed)
F/U 6 months Physical  

## 2018-12-12 NOTE — Assessment & Plan Note (Signed)
Blood pressures well controlled no change in medications.

## 2018-12-12 NOTE — Assessment & Plan Note (Signed)
He is continue with her ketogenic diet which she felt she is able to maintain.  We also discussed adding aerobic exercise to help move her weight loss along further.

## 2018-12-13 LAB — CBC WITH DIFFERENTIAL/PLATELET
BASOS ABS: 32 {cells}/uL (ref 0–200)
Basophils Relative: 0.6 %
EOS ABS: 101 {cells}/uL (ref 15–500)
Eosinophils Relative: 1.9 %
HEMATOCRIT: 41.7 % (ref 35.0–45.0)
Hemoglobin: 14.3 g/dL (ref 11.7–15.5)
Lymphs Abs: 2592 cells/uL (ref 850–3900)
MCH: 29.4 pg (ref 27.0–33.0)
MCHC: 34.3 g/dL (ref 32.0–36.0)
MCV: 85.8 fL (ref 80.0–100.0)
MPV: 10.7 fL (ref 7.5–12.5)
Monocytes Relative: 6.3 %
NEUTROS PCT: 42.3 %
Neutro Abs: 2242 cells/uL (ref 1500–7800)
Platelets: 279 10*3/uL (ref 140–400)
RBC: 4.86 10*6/uL (ref 3.80–5.10)
RDW: 14.1 % (ref 11.0–15.0)
Total Lymphocyte: 48.9 %
WBC: 5.3 10*3/uL (ref 3.8–10.8)
WBCMIX: 334 {cells}/uL (ref 200–950)

## 2018-12-13 LAB — LIPID PANEL
CHOLESTEROL: 335 mg/dL — AB (ref <200)
HDL: 70 mg/dL (ref >50)
LDL Cholesterol (Calc): 242 mg/dL (calc) — ABNORMAL HIGH
Non-HDL Cholesterol (Calc): 265 mg/dL (calc) — ABNORMAL HIGH (ref <130)
Total CHOL/HDL Ratio: 4.8 (calc) (ref <5.0)
Triglycerides: 99 mg/dL (ref <150)

## 2018-12-13 LAB — COMPREHENSIVE METABOLIC PANEL
AG Ratio: 1.7 (calc) (ref 1.0–2.5)
ALBUMIN MSPROF: 4.6 g/dL (ref 3.6–5.1)
ALKALINE PHOSPHATASE (APISO): 74 U/L (ref 33–130)
ALT: 12 U/L (ref 6–29)
AST: 17 U/L (ref 10–35)
BILIRUBIN TOTAL: 0.4 mg/dL (ref 0.2–1.2)
BUN: 20 mg/dL (ref 7–25)
CALCIUM: 9.6 mg/dL (ref 8.6–10.4)
CO2: 26 mmol/L (ref 20–32)
Chloride: 101 mmol/L (ref 98–110)
Creat: 0.92 mg/dL (ref 0.50–1.05)
Globulin: 2.7 g/dL (calc) (ref 1.9–3.7)
Glucose, Bld: 87 mg/dL (ref 65–99)
POTASSIUM: 3.8 mmol/L (ref 3.5–5.3)
Sodium: 139 mmol/L (ref 135–146)
Total Protein: 7.3 g/dL (ref 6.1–8.1)

## 2018-12-13 LAB — VITAMIN D 25 HYDROXY (VIT D DEFICIENCY, FRACTURES): Vit D, 25-Hydroxy: 49 ng/mL (ref 30–100)

## 2018-12-13 LAB — VITAMIN B12: Vitamin B-12: >2000 pg/mL — ABNORMAL HIGH (ref 200–1100)

## 2018-12-22 ENCOUNTER — Other Ambulatory Visit: Payer: Self-pay | Admitting: *Deleted

## 2018-12-22 MED ORDER — VITAMIN B 12 500 MCG PO TABS: 1.0000 | ORAL_TABLET | Freq: Every day | ORAL | Status: DC

## 2018-12-26 ENCOUNTER — Encounter: Payer: Self-pay | Admitting: Family Medicine

## 2019-01-07 ENCOUNTER — Other Ambulatory Visit: Payer: Self-pay | Admitting: Obstetrics and Gynecology

## 2019-01-07 DIAGNOSIS — Z1231 Encounter for screening mammogram for malignant neoplasm of breast: Secondary | ICD-10-CM

## 2019-01-09 ENCOUNTER — Ambulatory Visit
Admission: RE | Admit: 2019-01-09 | Discharge: 2019-01-09 | Disposition: A | Discharge status: Home / Self Care | Payer: BC Managed Care – PPO | Source: Ambulatory Visit | Attending: Obstetrics and Gynecology | Admitting: Obstetrics and Gynecology

## 2019-01-09 DIAGNOSIS — Z1231 Encounter for screening mammogram for malignant neoplasm of breast: Secondary | ICD-10-CM

## 2019-01-19 ENCOUNTER — Other Ambulatory Visit: Payer: Self-pay | Admitting: Family Medicine

## 2019-02-17 ENCOUNTER — Other Ambulatory Visit (HOSPITAL_COMMUNITY)
Admission: RE | Admit: 2019-02-17 | Discharge: 2019-02-17 | Disposition: A | Discharge status: Home / Self Care | Payer: BC Managed Care – PPO | Source: Ambulatory Visit | Attending: Certified Nurse Midwife | Admitting: Certified Nurse Midwife

## 2019-02-17 ENCOUNTER — Encounter: Payer: Self-pay | Admitting: Certified Nurse Midwife

## 2019-02-17 ENCOUNTER — Ambulatory Visit (INDEPENDENT_AMBULATORY_CARE_PROVIDER_SITE_OTHER): Payer: BC Managed Care – PPO | Admitting: Certified Nurse Midwife

## 2019-02-17 ENCOUNTER — Other Ambulatory Visit: Payer: Self-pay

## 2019-02-17 VITALS — BP 120/74 | HR 68 | Resp 16 | Ht 61.5 in | Wt 169.0 lb

## 2019-02-17 DIAGNOSIS — Z8619 Personal history of other infectious and parasitic diseases: Secondary | ICD-10-CM

## 2019-02-17 DIAGNOSIS — Z124 Encounter for screening for malignant neoplasm of cervix: Secondary | ICD-10-CM | POA: Diagnosis present

## 2019-02-17 DIAGNOSIS — Z01419 Encounter for gynecological examination (general) (routine) without abnormal findings: Secondary | ICD-10-CM

## 2019-02-17 MED ORDER — VALACYCLOVIR HCL 500 MG PO TABS: ORAL_TABLET | ORAL | 12 refills | Status: DC

## 2019-02-17 NOTE — Progress Notes (Addendum)
58 y.o. [id number] Widowed  African American Fe here for annual exam. Denies vaginal bleeding. Vaginal dryness has improved with coconut oil use.  Sees Dr. Buelah Manis for hypertension,Vitamin D3 labs and aex. Sees Neurology for Dilantin management, all stable. No HSV outbreaks and has not needed to use Valtrex. No other health issues today.  Patient's last menstrual period was 12/31/2004 (approximate).          Sexually active: No.  The current method of family planning is status post hysterectomy supracervical..    Exercising: No.  exercise Smoker:  no  Review of Systems  Constitutional: Negative.   HENT: Negative.   Eyes: Negative.   Respiratory: Negative.   Cardiovascular: Negative.   Gastrointestinal: Negative.   Genitourinary: Negative.   Musculoskeletal: Negative.   Skin: Negative.   Neurological: Negative.   Endo/Heme/Allergies: Negative.   Psychiatric/Behavioral: Negative.     Health Maintenance: Pap:  01-04-16 neg HPV HR neg History of Abnormal Pap: yes MMG:  01-09-2019 category b density birads 1:neg Self Breast exams: no Colonoscopy:  2017 f/u 72yrs BMD:   none TDaP:  2018 Shingles: no Pneumonia: no Hep C and HIV: both neg 2017 Labs: if needed   reports that she quit smoking about 22 years ago. Her smoking use included cigarettes. She has never used smokeless tobacco. She reports current alcohol use of about 1.0 - 2.0 standard drinks of alcohol per week. She reports that she does not use drugs.  Past Medical History:  Diagnosis Date  . Abnormal Pap smear of cervix   . Carpal tunnel syndrome of right wrist   . Elevated cholesterol   . Family history of adverse reaction to anesthesia    sister has naseau  . Hypertension   . Increased risk of breast cancer 2018   27.7 lifetime risk.  Yearly MRI of breast.   . Low back pain   . OAB (overactive bladder)   . Pneumonia   . Seizures ([place])     Past Surgical History:  Procedure Laterality Date  . ABDOMINAL HYSTERECTOMY   06/23/2004   supracervical hysterectomy.  Ovaries remain.  Marland Kitchen CESAREAN SECTION    . CRYOTHERAPY    . ECTOPIC PREGNANCY SURGERY    . LUMBAR LAMINECTOMY/DECOMPRESSION MICRODISCECTOMY Left 05/07/2017   Procedure: Decompression L4-L5 and excision of synovial cyst Left;  Surgeon: Latanya Maudlin, MD;  Location: [facility];  Service: Orthopedics;  Laterality: Left;    Current Outpatient Medications  Medication Sig Dispense Refill  . atorvastatin (LIPITOR) 10 MG tablet Take 1 tablet (10 mg total) by mouth daily. 90 tablet 3  . BIOTIN PO Take by mouth.    . cetirizine (ZYRTEC) 10 MG tablet Take 1 tablet (10 mg total) by mouth daily. 90 tablet 3  . Cholecalciferol (VITAMIN D3) 2000 units capsule TAKE 1 CAPSULE BY MOUTH DAILY (Patient taking differently: twice weekly) 30 capsule 0  . ciprofloxacin (CILOXAN) 0.3 % ophthalmic solution Place 2 drops into both eyes 2 (two) times daily. As needed for irritation with contacts    . COLLAGEN PO Take by mouth.    . Cyanocobalamin (VITAMIN B 12) 500 MCG TABS Take 1 tablet by mouth daily. 30 tablet   . diclofenac (VOLTAREN) 75 MG EC tablet TAKE 1 TABLET(75 MG) BY MOUTH TWICE DAILY 60 tablet 0  . fluticasone (FLONASE) 50 MCG/ACT nasal spray fluticasone propionate 50 mcg/actuation nasal spray,suspension    . hydrochlorothiazide (HYDRODIURIL) 25 MG tablet Take 1/2 tablet once a day 45 tablet 3  . lidocaine (  XYLOCAINE) 5 % ointment Apply 1 application topically 4 (four) times daily as needed. 30 g 0  . phenytoin (DILANTIN) 100 MG ER capsule Take 2 capsules (200 mg total) by mouth every evening. 180 capsule 3  . scopolamine (TRANSDERM-SCOP, 1.5 MG,) 1 MG/3DAYS Place 1 patch (1.5 mg total) onto the skin every 3 (three) days. 10 patch 1  . Sod Fluoride-Potassium Nitrate (PREVIDENT 5000 SENSITIVE) 1.1-5 % PSTE PreviDent 5000 Sensitive 1.1 %-5 % dental paste    . triamcinolone (NASACORT) 55 MCG/ACT AERO nasal inhaler Place 2 sprays into the nose daily. 3 Inhaler 3  .  valACYclovir (VALTREX) 500 MG tablet 1 tablet BID x 3 days as needed. 30 tablet 1   No current facility-administered medications for this visit.     Family History  Problem Relation Age of Onset  . Breast cancer Mother 38       breast, recurrence 5 ys later to other breast then mastectomy  . Hypertension Mother   . Lung cancer Father 72       lung   . Diabetes Father   . Hypertension Father   . Breast cancer Sister 20       breast wih recurrence 10 yrs later and bilateral mastectomy  . Hypertension Sister     ROS:  Pertinent items are noted in HPI.  Otherwise, a comprehensive ROS was negative.  Exam:   LMP 12/31/2004 (Approximate)    Ht Readings from Last 3 Encounters:  12/12/18 5' 1.5" (1.562 m)  10/23/18 5' 1.75" (1.568 m)  06/13/18 5' 1.75" (1.568 m)    General appearance: alert, cooperative and appears stated age Head: Normocephalic, without obvious abnormality, atraumatic Neck: no adenopathy, supple, symmetrical, trachea midline and thyroid normal to inspection and palpation Lungs: clear to auscultation bilaterally Breasts: normal appearance, no masses or tenderness, No nipple retraction or dimpling, No nipple discharge or bleeding, No axillary or supraclavicular adenopathy Heart: regular rate and rhythm Abdomen: soft, non-tender; no masses,  no organomegaly Extremities: extremities normal, atraumatic, no cyanosis or edema Skin: Skin color, texture, turgor normal. No rashes or lesions Lymph nodes: Cervical, supraclavicular, and axillary nodes normal. No abnormal inguinal nodes palpated Neurologic: Grossly normal   Pelvic: External genitalia:  no lesions, normal female              Urethra:  normal appearing urethra with no masses, tenderness or lesions              Bartholin's and Skene's: normal                 Vagina: normal appearing vagina with normal color and discharge, no lesions              Cervix: no cervical motion tenderness, no lesions and normal  appearance              Pap taken: Yes.   Bimanual Exam:  Uterus:  uterus absent              Adnexa: normal adnexa and no mass, fullness, tenderness               Rectovaginal: Confirms               Anus:  normal sphincter tone, no lesions  Chaperone present: yes  A:  Well Woman with normal exam  Menopausal no HRT S/P TAH supracervical ovaries retained  History of HSV, Rx update needed  Hypertension and Seizure management with MD, all stable per patient  P:   Reviewed health and wellness pertinent to exam  Aware if vaginal bleeding needs to advise.  Rx Valtrex see order with instructions  Continue follow up with MD as indicated  Pap smear: yes   counseled on breast self exam, mammography screening, feminine hygiene, adequate intake of calcium and vitamin D, diet and exercise, Kegel's exercises  return annually or prn  An After Visit Summary was printed and given to the patient.

## 2019-02-17 NOTE — Patient Instructions (Signed)

## 2019-02-19 LAB — CYTOLOGY - PAP: HPV (WINDOPATH): NOT DETECTED

## 2019-02-22 ENCOUNTER — Other Ambulatory Visit: Payer: Self-pay | Admitting: Certified Nurse Midwife

## 2019-02-22 DIAGNOSIS — R87619 Unspecified abnormal cytological findings in specimens from cervix uteri: Secondary | ICD-10-CM

## 2019-02-26 ENCOUNTER — Telehealth: Payer: Self-pay | Admitting: Emergency Medicine

## 2019-02-26 ENCOUNTER — Telehealth: Payer: Self-pay | Admitting: Certified Nurse Midwife

## 2019-02-26 NOTE — Telephone Encounter (Signed)
Reviewed with Dr. Sabra Heck. Schedule colposcopy with MD due to hx of supracervical hysterectomy.   Spoke with patient, she is given results.  Verbalizes understanding of need for follow up and colposcopy scheduled for 03/02/2019. She requests to see Dr. Quincy Simmonds, has seen her in the past.   Pre-procedure instructions given. Pt verbalized understanding.  Will call back with any questions.  Encounter to Dr. Quincy Simmonds and Melvia Heaps CNM.   Cc [employer]. Will close.

## 2019-02-26 NOTE — Telephone Encounter (Signed)
-----   Message from Regina Eck, CNM sent at 02/22/2019 11:52 AM EST ----- Notify patient that her pap smear showed atypical endometrial glandular cells, HPV not detected She needs colposcopy exam  History of supracervical hysterectomy. Please schedule  Order placed

## 2019-02-26 NOTE — Telephone Encounter (Signed)
Call placed to convey benefits for colposcopy. °

## 2019-02-27 NOTE — Telephone Encounter (Signed)
Patient wants to speak with Melvia Heaps. No information given.

## 2019-02-27 NOTE — Telephone Encounter (Signed)
Routing to [employer]. Pt requests to speak with provider.

## 2019-03-02 ENCOUNTER — Other Ambulatory Visit: Payer: Self-pay

## 2019-03-02 ENCOUNTER — Ambulatory Visit: Payer: BC Managed Care – PPO | Admitting: Obstetrics and Gynecology

## 2019-03-02 ENCOUNTER — Encounter: Payer: Self-pay | Admitting: Obstetrics and Gynecology

## 2019-03-02 DIAGNOSIS — R87619 Unspecified abnormal cytological findings in specimens from cervix uteri: Secondary | ICD-10-CM

## 2019-03-02 NOTE — Progress Notes (Signed)
  Subjective:     Patient ID: Lisa [name], female   DOB: 1961/11/23, 58 y.o.   MRN: [id number]  HPI  Patient here today for pap 02-17-19 revealing AGUS:Neg HR HPV.  States hx of abnormal pap in her 57s. She had cryotherapy treatment.   She had a supracervical hysterectomy. 2005 with Dr. Glo Herring.  This is her first abnormal pap since hysterectomy.   Review of Systems  All other systems reviewed and are negative.   LMP: Supracervical Hyst Contraception: Supracervical Hyst     Objective:   Physical Exam Genitourinary:      Colposcopy - cervix, vagina. Consent for procedure.  3% acetic acid used in vagina and cervix White light and green light filter used.  Colposcopy satisfactory:  Yes   _x____          No    _____ Findings:    Cervix:  No lesions noted.  With Lugol's decreased uptake at 12, 3, and 6 o'clock.  Vagina: No lesions.  Tenaculum to anterior cervical lip.  Os finder used as cervix was somewhat stenotic.  This was necessary to be able to do the ECC. Biopsies:   ECC and cx at 3:00.   Monsel's placed.  Minimal EBL. No complications.       Assessment:      Status post supracervical hysterectomy.  AGUS pap.  Neg HR HPV. Plan:     FU biopsies.  I discussed potential LEEP procedure.  May also consider cervical conization if needed.   After visit summary to patient.

## 2019-03-02 NOTE — Patient Instructions (Signed)
Colposcopy, Care After  This sheet gives you information about how to care for yourself after your procedure. Your doctor may also give you more specific instructions. If you have problems or questions, contact your doctor.  What can I expect after the procedure?  If you did not have a tissue sample removed (did not have a biopsy), you may only have some spotting for a few days. You can go back to your normal activities.  If you had a tissue sample removed, it is common to have:   Soreness and pain. This may last for a few days.   Light-headedness.   Mild bleeding from your vagina or dark-colored, grainy discharge from your vagina. This may last for a few days. You may need to wear a sanitary pad.   Spotting for at least 48 hours after the procedure.  Follow these instructions at home:     Take over-the-counter and prescription medicines only as told by your doctor. Ask your doctor what medicines you can start taking again. This is very important if you take blood-thinning medicine.   Do not drive or use heavy machinery while taking prescription pain medicine.   For 3 days, or as long as your doctor tells you, avoid:  ? Douching.  ? Using tampons.  ? Having sex.   If you use birth control (contraception), keep using it.   Limit activity for the first day after the procedure. Ask your doctor what activities are safe for you.   It is up to you to get the results of your procedure. Ask your doctor when your results will be ready.   Keep all follow-up visits as told by your doctor. This is important.  Contact a doctor if:   You get a skin rash.  Get help right away if:   You are bleeding a lot from your vagina. It is a lot of bleeding if you are using more than one pad an hour for 2 hours in a row.   You have clumps of blood (blood clots) coming from your vagina.   You have a fever.   You have chills   You have pain in your lower belly (pelvic area).   You have signs of infection, such as vaginal  discharge that is:  ? Different than usual.  ? Yellow.  ? Bad-smelling.   You have very pain or cramps in your lower belly that do not get better with medicine.   You feel light-headed.   You feel dizzy.   You pass out (faint).  Summary   If you did not have a tissue sample removed (did not have a biopsy), you may only have some spotting for a few days. You can go back to your normal activities.   If you had a tissue sample removed, it is common to have mild pain and spotting for 48 hours.   For 3 days, or as long as your doctor tells you, avoid douching, using tampons and having sex.   Get help right away if you have bleeding, very bad pain, or signs of infection.  This information is not intended to replace advice given to you by your health care provider. Make sure you discuss any questions you have with your health care provider.  Document Released: 06/04/2008 Document Revised: 09/05/2016 Document Reviewed: 09/05/2016  Elsevier Interactive Patient Education  2019 [employer]

## 2019-03-03 ENCOUNTER — Encounter: Payer: Self-pay | Admitting: Obstetrics and Gynecology

## 2019-03-24 ENCOUNTER — Other Ambulatory Visit: Payer: Self-pay | Admitting: Family Medicine

## 2019-03-30 ENCOUNTER — Telehealth: Payer: Self-pay | Admitting: *Deleted

## 2019-03-30 NOTE — Telephone Encounter (Signed)
Received call from patient.   Reports that she has appointment scheduled with Ortho for cortisone injection for carpal tunnel. States that she is concerned about going to office for injection.   Advised to use discretion and good hygiene. Advised to wah hands frequently and to avoid personal space of others and use proactive social distancing.

## 2019-04-23 ENCOUNTER — Other Ambulatory Visit: Payer: Self-pay | Admitting: Family Medicine

## 2019-06-11 ENCOUNTER — Other Ambulatory Visit: Payer: Self-pay | Admitting: Family Medicine

## 2019-06-16 ENCOUNTER — Ambulatory Visit (INDEPENDENT_AMBULATORY_CARE_PROVIDER_SITE_OTHER): Payer: BC Managed Care – PPO | Admitting: Family Medicine

## 2019-06-16 ENCOUNTER — Encounter: Payer: Self-pay | Admitting: Family Medicine

## 2019-06-16 ENCOUNTER — Other Ambulatory Visit: Payer: Self-pay

## 2019-06-16 ENCOUNTER — Telehealth: Payer: Self-pay | Admitting: Family Medicine

## 2019-06-16 ENCOUNTER — Other Ambulatory Visit: Payer: Self-pay | Admitting: *Deleted

## 2019-06-16 VITALS — BP 120/62 | HR 62 | Temp 97.9°F | Resp 14 | Ht 61.5 in | Wt 165.0 lb

## 2019-06-16 DIAGNOSIS — E559 Vitamin D deficiency, unspecified: Secondary | ICD-10-CM

## 2019-06-16 DIAGNOSIS — Z0000 Encounter for general adult medical examination without abnormal findings: Secondary | ICD-10-CM

## 2019-06-16 DIAGNOSIS — Z0001 Encounter for general adult medical examination with abnormal findings: Secondary | ICD-10-CM | POA: Diagnosis not present

## 2019-06-16 DIAGNOSIS — E785 Hyperlipidemia, unspecified: Secondary | ICD-10-CM

## 2019-06-16 DIAGNOSIS — Z8619 Personal history of other infectious and parasitic diseases: Secondary | ICD-10-CM

## 2019-06-16 DIAGNOSIS — G40909 Epilepsy, unspecified, not intractable, without status epilepticus: Secondary | ICD-10-CM | POA: Diagnosis not present

## 2019-06-16 DIAGNOSIS — E538 Deficiency of other specified B group vitamins: Secondary | ICD-10-CM

## 2019-06-16 DIAGNOSIS — I10 Essential (primary) hypertension: Secondary | ICD-10-CM

## 2019-06-16 MED ORDER — HYDROCHLOROTHIAZIDE 25 MG PO TABS: ORAL_TABLET | ORAL | 3 refills | Status: DC

## 2019-06-16 MED ORDER — SHINGRIX 50 MCG/0.5ML IM SUSR: 0.5000 mL | Freq: Once | INTRAMUSCULAR | 1 refills | Status: AC

## 2019-06-16 MED ORDER — VITAMIN B-12 1000 MCG PO TABS: 1000.0000 ug | ORAL_TABLET | Freq: Every day | ORAL | Status: DC

## 2019-06-16 MED ORDER — CLOBETASOL PROPIONATE 0.05 % EX CREA: TOPICAL_CREAM | CUTANEOUS | 0 refills | Status: DC

## 2019-06-16 MED ORDER — VALACYCLOVIR HCL 500 MG PO TABS: ORAL_TABLET | ORAL | 6 refills | Status: DC

## 2019-06-16 MED ORDER — DICLOFENAC SODIUM 75 MG PO TBEC: DELAYED_RELEASE_TABLET | ORAL | 2 refills | Status: DC

## 2019-06-16 MED ORDER — FINASTERIDE 5 MG PO TABS: 2.5000 mg | ORAL_TABLET | Freq: Every day | ORAL | 1 refills | Status: DC

## 2019-06-16 MED ORDER — PREVIDENT 5000 SENSITIVE 1.1-5 % DT PSTE: PASTE | DENTAL | 3 refills | Status: DC

## 2019-06-16 MED ORDER — OLOPATADINE HCL 0.2 % OP SOLN: OPHTHALMIC | 1 refills | Status: DC

## 2019-06-16 MED ORDER — VITAMIN D3 125 MCG (5000 UT) PO TABS: 5000.0000 [IU] | ORAL_TABLET | Freq: Every day | ORAL | Status: DC

## 2019-06-16 MED ORDER — PHENYTOIN SODIUM EXTENDED 100 MG PO CAPS: 200.0000 mg | ORAL_CAPSULE | Freq: Every evening | ORAL | 3 refills | Status: DC

## 2019-06-16 MED ORDER — VITAMIN C 500 MG PO TABS: 1000.0000 mg | ORAL_TABLET | Freq: Every day | ORAL | Status: AC

## 2019-06-16 MED ORDER — FINASTERIDE 5 MG PO TABS: 2.5000 mg | ORAL_TABLET | Freq: Every day | ORAL | Status: DC

## 2019-06-16 NOTE — Assessment & Plan Note (Signed)
Controlled no changes 

## 2019-06-16 NOTE — Addendum Note (Signed)
Addended by: Sheral Flow on: 06/16/2019 02:40 PM   Modules accepted: Orders

## 2019-06-16 NOTE — Assessment & Plan Note (Signed)
No recent seizures, maintained on dilantin, prefers to stay on the medication

## 2019-06-16 NOTE — Assessment & Plan Note (Signed)
Declines medication at this time Recheck labs Following ketogenic diet But has very high LDL

## 2019-06-16 NOTE — Telephone Encounter (Signed)
Call pt, Vitamin B 12 is 1000mg   Vitamin C  1000 Vitamin D, can be up to 5000IU once a day

## 2019-06-16 NOTE — Progress Notes (Signed)
   Subjective:    Patient ID: Lisa [name], female    DOB: August 28, 1961, 58 y.o.   MRN: [id number]  Patient presents for Annual Exam (is fasting)  Pt here for CPE, due for fasting labs meds and history reviewed  HTN- taking BP meds HCTZ 12.5mg   without difficulty Hyperlipidemia- controlling with diet,follows ketogenic diet , she was prescribed lipitor  But she never took the medication History of seizure disorder- has been maintained on dilantin for many years, no recent seizures   Mammogram- UTD Jan  2020 GYN- Dr. Judeth Horn, had colposcopy which was normal   Has b12 and Vitamin D def, being treated with supplemental replacement   Weight down another 5lbs since December , new goal is  155lbs    Seen - Finesteride 1/2 tablet and rogaine, now has regrowth in scalp seen by Dr. Mariel Kans[facility]   Pumpkin seed oil, biotin clobestasol topical  Seen by Dr. Caralyn Guile for her carpal tunnel, getting injections   Colonoscopy UTD   Review Of Systems:  GEN- denies fatigue, fever, weight loss,weakness, recent illness HEENT- denies eye drainage, change in vision, nasal discharge, CVS- denies chest pain, palpitations RESP- denies SOB, cough, wheeze ABD- denies N/V, change in stools, abd pain GU- denies dysuria, hematuria, dribbling, incontinence MSK- denies joint pain, muscle aches, injury Neuro- denies headache, dizziness, syncope, seizure activity       Objective:    BP 120/62   Pulse 62   Temp 97.9 F (36.6 C) (Oral)   Resp 14   Ht 5' 1.5" (1.562 m)   Wt 165 lb (74.8 kg)   LMP 12/31/2004 (Approximate)   SpO2 99%   BMI 30.67 kg/m  GEN- NAD, alert and oriented x3 HEENT- PERRL, EOMI, non injected sclera, pink conjunctiva, MMM, oropharynx clear Neck- Supple, no thyromegaly CVS- RRR, no murmur RESP-CTAB ABD-NABS,soft,NT,ND EXT- No edema Pulses- Radial, DP- 2+        Assessment & Plan:      Problem List Items Addressed This Visit      Unprioritized   B12  deficiency   Relevant Orders   Vitamin B12   Essential hypertension    Controlled no changes       Relevant Medications   hydrochlorothiazide (HYDRODIURIL) 25 MG tablet   Other Relevant Orders   CBC with Differential/Platelet   Comprehensive metabolic panel   Lipid panel   Hyperlipidemia    Declines medication at this time Recheck labs Following ketogenic diet But has very high LDL      Relevant Medications   hydrochlorothiazide (HYDRODIURIL) 25 MG tablet   Other Relevant Orders   Lipid panel   Seizure disorder ([place])    No recent seizures, maintained on dilantin, prefers to stay on the medication      Relevant Medications   phenytoin (DILANTIN) 100 MG ER capsule   Vitamin D deficiency    Continue supplements      Relevant Orders   Vitamin D, 25-hydroxy    Other Visit Diagnoses    Routine general medical examination at a health care facility    -  Primary   CPE done, shingrix to pharmacy   History of herpes genitalis       Relevant Medications   valACYclovir (VALTREX) 500 MG tablet      Note: This dictation was prepared with Dragon dictation along with smaller phrase technology. Any transcriptional errors that result from this process are unintentional.

## 2019-06-16 NOTE — Patient Instructions (Addendum)
F/U 6 months  We will call with lab results  shingrix to pharmacy

## 2019-06-16 NOTE — Telephone Encounter (Signed)
Patient stopped at front desk and inquired about Vit B strength. Was advised that MD recommended 6071mcg SL.   Requested hard script for Vit B. Ok to print?

## 2019-06-16 NOTE — Telephone Encounter (Signed)
Pt would like [place] to give her a paper script for her b12 she said she would come by tomorrow to pick it up.

## 2019-06-16 NOTE — Telephone Encounter (Signed)
Call placed to patient. LMTRC.  

## 2019-06-16 NOTE — Addendum Note (Signed)
Addended by: Sheral Flow on: 06/16/2019 01:55 PM   Modules accepted: Orders

## 2019-06-16 NOTE — Assessment & Plan Note (Signed)
Continue supplements

## 2019-06-17 LAB — CBC WITH DIFFERENTIAL/PLATELET
Absolute Monocytes: 336 cells/uL (ref 200–950)
Basophils Absolute: 28 cells/uL (ref 0–200)
Basophils Relative: 0.6 %
Eosinophils Absolute: 60 cells/uL (ref 15–500)
Eosinophils Relative: 1.3 %
HCT: 38.8 % (ref 35.0–45.0)
Hemoglobin: 13.3 g/dL (ref 11.7–15.5)
Lymphs Abs: 2512 cells/uL (ref 850–3900)
MCH: 30.1 pg (ref 27.0–33.0)
MCHC: 34.3 g/dL (ref 32.0–36.0)
MCV: 87.8 fL (ref 80.0–100.0)
MPV: 10.9 fL (ref 7.5–12.5)
Monocytes Relative: 7.3 %
Neutro Abs: 1665 cells/uL (ref 1500–7800)
Neutrophils Relative %: 36.2 %
Platelets: 248 10*3/uL (ref 140–400)
RBC: 4.42 10*6/uL (ref 3.80–5.10)
RDW: 14.2 % (ref 11.0–15.0)
Total Lymphocyte: 54.6 %
WBC: 4.6 10*3/uL (ref 3.8–10.8)

## 2019-06-17 LAB — COMPREHENSIVE METABOLIC PANEL
AG Ratio: 1.8 (calc) (ref 1.0–2.5)
ALT: 31 U/L — ABNORMAL HIGH (ref 6–29)
AST: 23 U/L (ref 10–35)
Albumin: 4.3 g/dL (ref 3.6–5.1)
Alkaline phosphatase (APISO): 64 U/L (ref 37–153)
BUN: 25 mg/dL (ref 7–25)
CO2: 26 mmol/L (ref 20–32)
Calcium: 9.2 mg/dL (ref 8.6–10.4)
Chloride: 102 mmol/L (ref 98–110)
Creat: 0.95 mg/dL (ref 0.50–1.05)
Globulin: 2.4 g/dL (calc) (ref 1.9–3.7)
Glucose, Bld: 87 mg/dL (ref 65–99)
Potassium: 4.5 mmol/L (ref 3.5–5.3)
Sodium: 139 mmol/L (ref 135–146)
Total Bilirubin: 0.3 mg/dL (ref 0.2–1.2)
Total Protein: 6.7 g/dL (ref 6.1–8.1)

## 2019-06-17 LAB — LIPID PANEL
Cholesterol: 352 mg/dL — ABNORMAL HIGH (ref <200)
HDL: 68 mg/dL (ref >=50)
LDL Cholesterol (Calc): 265 mg/dL (calc) — ABNORMAL HIGH
Non-HDL Cholesterol (Calc): 284 mg/dL (calc) — ABNORMAL HIGH (ref <130)
Total CHOL/HDL Ratio: 5.2 (calc) — ABNORMAL HIGH (ref <5.0)
Triglycerides: 81 mg/dL (ref <150)

## 2019-06-17 LAB — VITAMIN B12: Vitamin B-12: 445 pg/mL (ref 200–1100)

## 2019-06-17 LAB — VITAMIN D 25 HYDROXY (VIT D DEFICIENCY, FRACTURES): Vit D, 25-Hydroxy: 128 ng/mL — ABNORMAL HIGH (ref 30–100)

## 2019-06-18 ENCOUNTER — Other Ambulatory Visit: Payer: Self-pay | Admitting: *Deleted

## 2019-06-18 MED ORDER — VITAMIN D 50 MCG (2000 UT) PO CAPS: 2000.0000 [IU] | ORAL_CAPSULE | Freq: Every day | ORAL | Status: AC

## 2019-06-18 MED ORDER — ATORVASTATIN CALCIUM 10 MG PO TABS: 10.0000 mg | ORAL_TABLET | Freq: Every day | ORAL | 3 refills | Status: DC

## 2019-06-19 ENCOUNTER — Encounter: Payer: Self-pay | Admitting: Family Medicine

## 2019-07-07 ENCOUNTER — Encounter: Payer: Self-pay | Admitting: Family Medicine

## 2019-07-07 ENCOUNTER — Telehealth: Payer: Self-pay | Admitting: Family Medicine

## 2019-07-07 MED ORDER — PHENYTOIN SODIUM EXTENDED 100 MG PO CAPS: 200.0000 mg | ORAL_CAPSULE | Freq: Every evening | ORAL | 3 refills | Status: DC

## 2019-07-07 MED ORDER — HYDROCHLOROTHIAZIDE 25 MG PO TABS: ORAL_TABLET | ORAL | 3 refills | Status: DC

## 2019-07-07 NOTE — Telephone Encounter (Signed)
Prescription printed

## 2019-07-07 NOTE — Telephone Encounter (Signed)
Pt would like a refill on dilantin paper script.

## 2019-08-04 ENCOUNTER — Other Ambulatory Visit: Payer: Self-pay | Admitting: *Deleted

## 2019-08-04 MED ORDER — OLOPATADINE HCL 0.1 % OP SOLN: 1.0000 [drp] | Freq: Two times a day (BID) | OPHTHALMIC | 3 refills | Status: DC

## 2019-08-05 ENCOUNTER — Other Ambulatory Visit: Payer: Self-pay

## 2019-08-05 DIAGNOSIS — Z8619 Personal history of other infectious and parasitic diseases: Secondary | ICD-10-CM

## 2019-08-05 MED ORDER — HYDROCHLOROTHIAZIDE 25 MG PO TABS: ORAL_TABLET | ORAL | 3 refills | Status: DC

## 2019-08-05 MED ORDER — FINASTERIDE 5 MG PO TABS: 2.5000 mg | ORAL_TABLET | Freq: Every day | ORAL | 1 refills | Status: DC

## 2019-08-05 MED ORDER — PREVIDENT 5000 SENSITIVE 1.1-5 % DT PSTE: PASTE | DENTAL | 5 refills | Status: AC

## 2019-08-05 MED ORDER — ATORVASTATIN CALCIUM 10 MG PO TABS: 10.0000 mg | ORAL_TABLET | Freq: Every day | ORAL | 3 refills | Status: DC

## 2019-08-05 MED ORDER — VALACYCLOVIR HCL 500 MG PO TABS: ORAL_TABLET | ORAL | 6 refills | Status: DC

## 2019-08-05 MED ORDER — DICLOFENAC SODIUM 75 MG PO TBEC: DELAYED_RELEASE_TABLET | ORAL | 2 refills | Status: DC

## 2019-08-05 MED ORDER — PHENYTOIN SODIUM EXTENDED 100 MG PO CAPS: 200.0000 mg | ORAL_CAPSULE | Freq: Every evening | ORAL | 3 refills | Status: DC

## 2019-08-05 MED ORDER — OLOPATADINE HCL 0.1 % OP SOLN: 1.0000 [drp] | Freq: Two times a day (BID) | OPHTHALMIC | 3 refills | Status: DC

## 2019-09-15 ENCOUNTER — Encounter: Payer: Self-pay | Admitting: Family Medicine

## 2019-09-18 ENCOUNTER — Other Ambulatory Visit: Payer: Self-pay

## 2019-09-18 ENCOUNTER — Encounter: Payer: Self-pay | Admitting: Family Medicine

## 2019-09-18 ENCOUNTER — Ambulatory Visit (INDEPENDENT_AMBULATORY_CARE_PROVIDER_SITE_OTHER): Payer: BC Managed Care – PPO | Admitting: Family Medicine

## 2019-09-18 VITALS — BP 118/62 | HR 82 | Temp 98.8°F | Resp 14 | Ht 61.5 in | Wt 163.0 lb

## 2019-09-18 DIAGNOSIS — G4700 Insomnia, unspecified: Secondary | ICD-10-CM

## 2019-09-18 DIAGNOSIS — Z23 Encounter for immunization: Secondary | ICD-10-CM

## 2019-09-18 DIAGNOSIS — F411 Generalized anxiety disorder: Secondary | ICD-10-CM

## 2019-09-18 MED ORDER — TRAZODONE HCL 50 MG PO TABS: 25.0000 mg | ORAL_TABLET | Freq: Every evening | ORAL | 3 refills | Status: DC | PRN

## 2019-09-18 MED ORDER — LORAZEPAM 0.5 MG PO TABS: 0.5000 mg | ORAL_TABLET | Freq: Two times a day (BID) | ORAL | 1 refills | Status: DC | PRN

## 2019-09-18 NOTE — Patient Instructions (Addendum)
Try the trazodone at bedtime Ativan as back up for anxiety  F/U as previous

## 2019-09-18 NOTE — Assessment & Plan Note (Signed)
Significant anxiety along with insomnia.  I'm going to start her on trazodone 25 mg at bedtime she can increase to 50 mg as needed.  Have also given her Lorazepam to have on hand she can use this up to twice a day to help with mood when she is feeling overly anxious.  If the trazodone does not work for sleep she can use this at bedtime.

## 2019-09-18 NOTE — Progress Notes (Signed)
   Subjective:    Patient ID: Lisa [name], female    DOB: 03-Aug-1961, 58 y.o.   MRN: [id number]  Patient presents for Anxiety (Mychart message )  Patient here with increased anxiety.  Just everything that is going on right now and has her very anxious and on and she cannot turn her mind off to sleep.  She is significantly worried about the president election and what may happen to her country if our president is reelected.  She is also stressed in the setting of COVID and having to stay within her home.  She is traveling to see her son and her grandchildren in [place] this weekend which she is looking forward to.  She denies feeling depressed or sad but she is on edge and anxious.  She would like to try something to help calm down her anxiety and help her sleep. No she is also had some family stressors where she is not on good speaking terms with her mother and 1 of her sisters right now.  Review Of Systems:  GEN- denies fatigue, fever, weight loss,weakness, recent illness HEENT- denies eye drainage, change in vision, nasal discharge, CVS- denies chest pain, palpitations RESP- denies SOB, cough, wheeze ABD- denies N/V, change in stools, abd pain Neuro- denies headache, dizziness, syncope, seizure activity       Objective:    BP 118/62   Pulse 82   Temp 98.8 F (37.1 C) (Oral)   Resp 14   Ht 5' 1.5" (1.562 m)   Wt 163 lb (73.9 kg)   LMP 12/31/2004 (Approximate)   SpO2 98%   BMI 30.30 kg/m  GEN- NAD, alert and oriented x3 CVS- RRR, no murmur RESP-CTAB pSYCH- NORMAL AFFECT AND MOOD   GAD 7 score 14 /PHQ 914    Assessment & Plan:      Problem List Items Addressed This Visit      Unprioritized   Anxiety state - Primary    Significant anxiety along with insomnia.  I'm going to start her on trazodone 25 mg at bedtime she can increase to 50 mg as needed.  Have also given her Lorazepam to have on hand she can use this up to twice a day to help with mood when she is  feeling overly anxious.  If the trazodone does not work for sleep she can use this at bedtime.      Relevant Medications   traZODone (DESYREL) 50 MG tablet   LORazepam (ATIVAN) 0.5 MG tablet   Insomnia    Other Visit Diagnoses    Need for immunization against influenza       Relevant Orders   Flu Vaccine QUAD 36+ mos IM (Completed)      Note: This dictation was prepared with Dragon dictation along with smaller phrase technology. Any transcriptional errors that result from this process are unintentional.

## 2019-10-19 ENCOUNTER — Encounter: Payer: Self-pay | Admitting: Family Medicine

## 2019-10-29 DIAGNOSIS — M65332 Trigger finger, left middle finger: Secondary | ICD-10-CM | POA: Insufficient documentation

## 2019-11-20 DIAGNOSIS — L089 Local infection of the skin and subcutaneous tissue, unspecified: Secondary | ICD-10-CM | POA: Insufficient documentation

## 2019-12-16 ENCOUNTER — Ambulatory Visit: Payer: BC Managed Care – PPO | Admitting: Family Medicine

## 2020-01-11 ENCOUNTER — Other Ambulatory Visit: Payer: Self-pay | Admitting: Family Medicine

## 2020-01-11 ENCOUNTER — Encounter: Payer: Self-pay | Admitting: Family Medicine

## 2020-01-11 ENCOUNTER — Other Ambulatory Visit: Payer: Self-pay

## 2020-01-11 ENCOUNTER — Ambulatory Visit (INDEPENDENT_AMBULATORY_CARE_PROVIDER_SITE_OTHER): Payer: BC Managed Care – PPO | Admitting: Family Medicine

## 2020-01-11 VITALS — BP 124/66 | HR 70 | Temp 98.6°F | Resp 14 | Ht 61.5 in | Wt 168.0 lb

## 2020-01-11 DIAGNOSIS — E6609 Other obesity due to excess calories: Secondary | ICD-10-CM | POA: Diagnosis not present

## 2020-01-11 DIAGNOSIS — G40909 Epilepsy, unspecified, not intractable, without status epilepticus: Secondary | ICD-10-CM | POA: Diagnosis not present

## 2020-01-11 DIAGNOSIS — Z6831 Body mass index (BMI) 31.0-31.9, adult: Secondary | ICD-10-CM

## 2020-01-11 DIAGNOSIS — F411 Generalized anxiety disorder: Secondary | ICD-10-CM

## 2020-01-11 DIAGNOSIS — E785 Hyperlipidemia, unspecified: Secondary | ICD-10-CM

## 2020-01-11 DIAGNOSIS — I10 Essential (primary) hypertension: Secondary | ICD-10-CM | POA: Diagnosis not present

## 2020-01-11 DIAGNOSIS — J301 Allergic rhinitis due to pollen: Secondary | ICD-10-CM

## 2020-01-11 DIAGNOSIS — Z1231 Encounter for screening mammogram for malignant neoplasm of breast: Secondary | ICD-10-CM

## 2020-01-11 MED ORDER — FISH OIL 1000 MG PO CAPS: ORAL_CAPSULE | ORAL | 0 refills | Status: DC

## 2020-01-11 MED ORDER — FLUTICASONE PROPIONATE 50 MCG/ACT NA SUSP: 2.0000 | Freq: Every day | NASAL | 6 refills | Status: DC

## 2020-01-11 NOTE — Assessment & Plan Note (Signed)
Zyrtec we will refill Flonase

## 2020-01-11 NOTE — Assessment & Plan Note (Signed)
Maintained on Dilantin she has not had a seizure in many years

## 2020-01-11 NOTE — Assessment & Plan Note (Signed)
Anxiety has improved.  She still has benzo on hand to use as needed.  As well as the trazodone.

## 2020-01-11 NOTE — Assessment & Plan Note (Signed)
Blood pressure is controlled no change in medication.  She will return for fasting labs.

## 2020-01-11 NOTE — Progress Notes (Signed)
   Subjective:    Patient ID: Lisa [name], female    DOB: 05/17/1961, 59 y.o.   MRN: [id number]  Patient presents for Follow-up (is not fasting)  Pt here to f/u chronic medical rpoblems  Last visit in Sept, had increasead anxiety    HTN- taking HCTZ   Allergies/allergic rhinitis taking zyrtec but needs nasal spray refilled   Needs flonase nasal spray   Hyperlipidemia- taking Lipitor as prescribed   Insomnia- stress much improved, he only required trazodone if you time to help with sleep.  She was able to visit with her son and her family and does help with her stress.    She is planning to restart keto diet- planning for 20 carbs or less    She is on new hair vitamin Nutra full - from Dr. Mariel Kansky   Review Of Systems:  GEN- denies fatigue, fever, weight loss,weakness, recent illness HEENT- denies eye drainage, change in vision, nasal discharge, CVS- denies chest pain, palpitations RESP- denies SOB, cough, wheeze ABD- denies N/V, change in stools, abd pain GU- denies dysuria, hematuria, dribbling, incontinence MSK- denies joint pain, muscle aches, injury Neuro- denies headache, dizziness, syncope, seizure activity       Objective:    BP 124/66   Pulse 70   Temp 98.6 F (37 C) (Temporal)   Resp 14   Ht 5' 1.5" (1.562 m)   Wt 168 lb (76.2 kg)   LMP 12/31/2004 (Approximate)   SpO2 96%   BMI 31.23 kg/m  GEN- NAD, alert and oriented x3 HEENT- PERRL, EOMI, non injected sclera, pink conjunctiva Neck- Supple, no thyromegaly CVS- RRR, no murmur RESP-CTAB ABD-NABS,soft,NT,ND EXT- No edema Pulses- Radial, DP- 2+        Assessment & Plan:      Problem List Items Addressed This Visit      Unprioritized   Allergic rhinitis    Zyrtec we will refill Flonase      Anxiety state    Anxiety has improved.  She still has benzo on hand to use as needed.  As well as the trazodone.      Essential hypertension    Blood pressure is controlled no change in  medication.  She will return for fasting labs.      Relevant Orders   CBC with Differential   Comprehensive metabolic panel   Hyperlipidemia    She is currently on statin drug.  She has high cholesterol but also on ketogenic diet which also raise her lipid levels.  Will return for fasting labs.      Relevant Orders   Lipid Panel   Obese - Primary    Plan to restart ketogenic diet with her carbs between 20-40      Seizure disorder ([place])    Maintained on Dilantin she has not had a seizure in many years      Relevant Orders   Phenytoin level, free and total      Note: This dictation was prepared with Dragon dictation along with smaller phrase technology. Any transcriptional errors that result from this process are unintentional.

## 2020-01-11 NOTE — Patient Instructions (Addendum)
Return for fasting labs   F/U 6 months for physical

## 2020-01-11 NOTE — Assessment & Plan Note (Signed)
Plan to restart ketogenic diet with her carbs between 20-40

## 2020-01-11 NOTE — Assessment & Plan Note (Signed)
She is currently on statin drug.  She has high cholesterol but also on ketogenic diet which also raise her lipid levels.  Will return for fasting labs.

## 2020-01-12 ENCOUNTER — Encounter: Payer: Self-pay | Admitting: Family Medicine

## 2020-01-12 DIAGNOSIS — Z8619 Personal history of other infectious and parasitic diseases: Secondary | ICD-10-CM

## 2020-01-12 MED ORDER — VALACYCLOVIR HCL 1 G PO TABS: ORAL_TABLET | ORAL | 3 refills | Status: DC

## 2020-01-12 MED ORDER — PHENYTOIN SODIUM EXTENDED 100 MG PO CAPS: 200.0000 mg | ORAL_CAPSULE | Freq: Every evening | ORAL | 3 refills | Status: DC

## 2020-01-12 MED ORDER — FLUTICASONE PROPIONATE 50 MCG/ACT NA SUSP: 2.0000 | Freq: Every day | NASAL | 3 refills | Status: DC

## 2020-01-12 MED ORDER — FINASTERIDE 5 MG PO TABS: 2.5000 mg | ORAL_TABLET | Freq: Every day | ORAL | 1 refills | Status: DC

## 2020-01-12 MED ORDER — VALACYCLOVIR HCL 500 MG PO TABS: ORAL_TABLET | ORAL | 6 refills | Status: DC

## 2020-01-15 ENCOUNTER — Other Ambulatory Visit: Payer: Self-pay

## 2020-01-15 ENCOUNTER — Ambulatory Visit
Admission: RE | Admit: 2020-01-15 | Discharge: 2020-01-15 | Disposition: A | Discharge status: Home / Self Care | Payer: BC Managed Care – PPO | Source: Ambulatory Visit | Attending: Family Medicine | Admitting: Family Medicine

## 2020-01-15 DIAGNOSIS — Z1231 Encounter for screening mammogram for malignant neoplasm of breast: Secondary | ICD-10-CM

## 2020-02-23 ENCOUNTER — Ambulatory Visit: Payer: BC Managed Care – PPO | Admitting: Certified Nurse Midwife

## 2020-02-25 ENCOUNTER — Ambulatory Visit: Payer: BC Managed Care – PPO | Attending: Internal Medicine

## 2020-02-25 ENCOUNTER — Ambulatory Visit: Payer: BC Managed Care – PPO

## 2020-02-25 DIAGNOSIS — Z23 Encounter for immunization: Secondary | ICD-10-CM | POA: Insufficient documentation

## 2020-02-25 NOTE — Progress Notes (Signed)
   Covid-[facility] Vaccination [facility]  Name:  Lisa [name]    MRN: [id number] DOB: [birth year]  02/25/2020  [name] was observed post Covid-19 immunization for 15 minutes without incidence. She was provided with Vaccine Information Sheet and instruction to access the V-Safe system.   [name] was instructed to call 911 with any severe reactions post vaccine: Marland Kitchen Difficulty breathing  . Swelling of your face and throat  . A fast heartbeat  . A bad rash all over your body  . Dizziness and weakness    Immunizations Administered    Name Date Dose VIS Date Route   Pfizer COVID-19 Vaccine 02/25/2020  4:00 PM 0.3 mL 12/11/2019 Intramuscular   Manufacturer: [employer]   Lot: [id number]   [place]: [id number]

## 2020-03-11 ENCOUNTER — Encounter: Payer: Self-pay | Admitting: Family Medicine

## 2020-03-11 ENCOUNTER — Other Ambulatory Visit: Payer: Self-pay

## 2020-03-14 ENCOUNTER — Other Ambulatory Visit: Payer: Self-pay

## 2020-03-14 ENCOUNTER — Encounter: Payer: Self-pay | Admitting: Certified Nurse Midwife

## 2020-03-14 ENCOUNTER — Ambulatory Visit (INDEPENDENT_AMBULATORY_CARE_PROVIDER_SITE_OTHER): Payer: BC Managed Care – PPO | Admitting: Certified Nurse Midwife

## 2020-03-14 ENCOUNTER — Other Ambulatory Visit (HOSPITAL_COMMUNITY)
Admission: RE | Admit: 2020-03-14 | Discharge: 2020-03-14 | Disposition: A | Discharge status: Home / Self Care | Payer: BC Managed Care – PPO | Source: Ambulatory Visit | Attending: Certified Nurse Midwife | Admitting: Certified Nurse Midwife

## 2020-03-14 VITALS — BP 118/78 | HR 68 | Temp 97.3°F | Resp 16 | Ht 61.75 in | Wt 168.0 lb

## 2020-03-14 DIAGNOSIS — Z124 Encounter for screening for malignant neoplasm of cervix: Secondary | ICD-10-CM

## 2020-03-14 DIAGNOSIS — Z01419 Encounter for gynecological examination (general) (routine) without abnormal findings: Secondary | ICD-10-CM | POA: Diagnosis not present

## 2020-03-14 NOTE — Progress Notes (Signed)
59 y.o. [id number] Widowed  African American Fe here for annual exam. Menopausal no HRT. Denies vaginal bleeding or vaginal dryness. Had normal colposcopy due to endometrial cells being present with pap smear last year. Using coconut oil for vaginal dryness. Sees Dr. Buelah Manis twice yearly, with labs, all stable. Hypertension stable with medication.. Patient has Valtrex Rx update from Dr. Buelah Manis. No other health issues at this time. Substitute teaching now and loves it!  Patient's last menstrual period was 12/31/2004 (approximate).          Sexually active: No.  The current method of family planning is status post hysterectomy. (Supracervical) Exercising: Yes.    yoga Smoker:  no  Review of Systems  Constitutional: Negative.   HENT: Negative.   Eyes: Negative.   Respiratory: Negative.   Cardiovascular: Negative.   Gastrointestinal: Negative.   Genitourinary: Negative.   Musculoskeletal: Negative.   Skin: Negative.   Neurological: Negative.   Endo/Heme/Allergies: Negative.   Psychiatric/Behavioral: Negative.     Health Maintenance: Pap:  01-04-16 neg HPV HR neg, 02-17-2019 atypical endometrial glandular cells HPV HR neg colpo LSIL History of Abnormal Pap: yes MMG:  01-19-2020 category b density birads 1:neg Self Breast exams: no Colonoscopy:  2017 f/u 89yrs BMD:   none TDaP:  2018 Shingles: no Pneumonia: no Hep C and HIV: both neg 2019 Labs: if needed   reports that she quit smoking about 23 years ago. Her smoking use included cigarettes. She has never used smokeless tobacco. She reports current alcohol use of about 1.0 - 2.0 standard drinks of alcohol per week. She reports that she does not use drugs.  Past Medical History:  Diagnosis Date  . Abnormal Pap smear of cervix    2020 - AGUS pap, negative HR HPV, colposcopy showing LGSIL on biopsy and ECC  . Carpal tunnel syndrome of right wrist   . Elevated cholesterol   . Family history of adverse reaction to anesthesia    sister has  naseau  . Hypertension   . Increased risk of breast cancer 2018   27.7 lifetime risk.  Yearly MRI of breast.   . Low back pain   . OAB (overactive bladder)   . Pneumonia   . Seizures ([place])     Past Surgical History:  Procedure Laterality Date  . ABDOMINAL HYSTERECTOMY  06/23/2004   supracervical hysterectomy.  Ovaries remain.  Marland Kitchen CESAREAN SECTION    . CRYOTHERAPY    . ECTOPIC PREGNANCY SURGERY    . LUMBAR LAMINECTOMY/DECOMPRESSION MICRODISCECTOMY Left 05/07/2017   Procedure: Decompression L4-L5 and excision of synovial cyst Left;  Surgeon: Latanya Maudlin, MD;  Location: [facility];  Service: Orthopedics;  Laterality: Left;    Current Outpatient Medications  Medication Sig Dispense Refill  . atorvastatin (LIPITOR) 10 MG tablet Take 1 tablet (10 mg total) by mouth daily. 90 tablet 3  . BIOTIN PO Take by mouth.    . cetirizine (ZYRTEC) 10 MG tablet Take 1 tablet (10 mg total) by mouth daily. 90 tablet 3  . Cholecalciferol (VITAMIN D) 50 MCG (2000 UT) CAPS Take 1 capsule (2,000 Units total) by mouth daily. 30 capsule   . clobetasol cream (TEMOVATE) 0.05 % Once daily three times a week 30 g 0  . COLLAGEN PO Take by mouth.    . finasteride (PROSCAR) 5 MG tablet Take 0.5 tablets (2.5 mg total) by mouth daily. 90 tablet 1  . fluticasone (FLONASE) 50 MCG/ACT nasal spray Place 2 sprays into both nostrils daily. 48 g 3  .  hydrochlorothiazide (HYDRODIURIL) 25 MG tablet Take 1/2 tablet once a day 45 tablet 3  . LORazepam (ATIVAN) 0.5 MG tablet Take 1 tablet (0.5 mg total) by mouth 2 (two) times daily as needed for anxiety. 30 tablet 1  . olopatadine (PATANOL) 0.1 % ophthalmic solution Place 1 drop into both eyes 2 (two) times daily. 15 mL 3  . Omega-3 Fatty Acids (FISH OIL) 1000 MG CAPS Take 1 capsule daily 30 capsule 0  . phenytoin (DILANTIN) 100 MG ER capsule Take 2 capsules (200 mg total) by mouth every evening. 180 capsule 3  . Sod Fluoride-Potassium Nitrate (PREVIDENT 5000 SENSITIVE) 1.1-5 %  PSTE PreviDent 5000 Sensitive 1.1 %-5 % dental paste 100 mL 5  . traZODone (DESYREL) 50 MG tablet Take 0.5-1 tablets (25-50 mg total) by mouth at bedtime as needed for sleep. 30 tablet 3  . valACYclovir (VALTREX) 1000 MG tablet (1/2) tablet daily for suppression- may increase to twice daily x3 days at onset of outbreak 45 tablet 3  . vitamin C (ASCORBIC ACID) 500 MG tablet Take 2 tablets (1,000 mg total) by mouth daily.     No current facility-administered medications for this visit.    Family History  Problem Relation Age of Onset  . Breast cancer Mother 44       breast, recurrence 5 ys later to other breast then mastectomy  . Hypertension Mother   . Lung cancer Father 35       lung   . Diabetes Father   . Hypertension Father   . Breast cancer Sister 22       breast wih recurrence 10 yrs later and bilateral mastectomy  . Hypertension Sister     ROS:  Pertinent items are noted in HPI.  Otherwise, a comprehensive ROS was negative.  Exam:   LMP 12/31/2004 (Approximate)    Ht Readings from Last 3 Encounters:  01/11/20 5' 1.5" (1.562 m)  09/18/19 5' 1.5" (1.562 m)  06/16/19 5' 1.5" (1.562 m)    General appearance: alert, cooperative and appears stated age Head: Normocephalic, without obvious abnormality, atraumatic Neck: no adenopathy, supple, symmetrical, trachea midline and thyroid normal to inspection and palpation Lungs: clear to auscultation bilaterally Breasts: normal appearance, no masses or tenderness, No nipple retraction or dimpling, No nipple discharge or bleeding, No axillary or supraclavicular adenopathy Heart: regular rate and rhythm Abdomen: soft, non-tender; no masses,  no organomegaly Extremities: extremities normal, atraumatic, no cyanosis or edema Skin: Skin color, texture, turgor normal. No rashes or lesions Lymph nodes: Cervical, supraclavicular, and axillary nodes normal. No abnormal inguinal nodes palpated Neurologic: Grossly normal   Pelvic: External  genitalia:  no lesions              Urethra:  normal appearing urethra with no masses, tenderness or lesions              Bartholin's and Skene's: normal                 Vagina: normal appearing vagina with normal color and discharge, no lesions              Cervix: no cervical motion tenderness, no lesions and normal appearance              Pap taken: Yes.   Bimanual Exam:  Uterus:  uterus absent              Adnexa: no mass, fullness, tenderness  Rectovaginal: Confirms               Anus:  normal sphincter tone, no lesions  Chaperone present: yes  A:  Well Woman with normal exam  Menopausal no HRT  History of abnormal pap smear with endometrial glandular cells,  Pathology LSIL  Medication management of valtrex with PCP  P:   Reviewed health and wellness pertinent to exam  Aware of need to advise if vaginal bleeding  Will await pap smear results.  Continue follow up with PCP as indicated for labs and medication management.  Pap smear: yes   counseled on breast self exam, mammography screening, feminine hygiene, menopause, adequate intake of calcium and vitamin D, diet and exercise  return annually or prn  An After Visit Summary was printed and given to the patient.

## 2020-03-14 NOTE — Patient Instructions (Signed)

## 2020-03-17 LAB — CYTOLOGY - PAP
Comment: NEGATIVE
Diagnosis: NEGATIVE
Diagnosis: REACTIVE
High risk HPV: NEGATIVE

## 2020-03-21 ENCOUNTER — Encounter: Payer: Self-pay | Admitting: Certified Nurse Midwife

## 2020-03-22 ENCOUNTER — Ambulatory Visit: Payer: BC Managed Care – PPO

## 2020-03-26 ENCOUNTER — Ambulatory Visit: Payer: BC Managed Care – PPO | Attending: Internal Medicine

## 2020-03-26 DIAGNOSIS — Z23 Encounter for immunization: Secondary | ICD-10-CM

## 2020-03-26 NOTE — Progress Notes (Signed)
   Covid-19 Vaccination Clinic  Name:  BOZENA SILVERIA    MRN: [id number] DOB: 08-25-1961  03/26/2020  Ms. Laver was observed post Covid-19 immunization for 15 minutes without incident. She was provided with Vaccine Information Sheet and instruction to access the V-Safe system.   Ms. Berdugo was instructed to call 911 with any severe reactions post vaccine: Marland Kitchen Difficulty breathing  . Swelling of face and throat  . A fast heartbeat  . A bad rash all over body  . Dizziness and weakness   Immunizations Administered    Name Date Dose VIS Date Route   Pfizer COVID-19 Vaccine 03/26/2020  4:04 PM 0.3 mL 12/11/2019 Intramuscular   Manufacturer: [place]   Lot: [id number]   [place]: [id number]

## 2020-03-28 ENCOUNTER — Ambulatory Visit: Payer: BC Managed Care – PPO

## 2020-07-06 ENCOUNTER — Encounter: Payer: Self-pay | Admitting: Family Medicine

## 2020-07-11 ENCOUNTER — Encounter: Payer: BC Managed Care – PPO | Admitting: Family Medicine

## 2020-09-22 ENCOUNTER — Encounter: Payer: Self-pay | Admitting: Family Medicine

## 2020-09-22 DIAGNOSIS — Z8619 Personal history of other infectious and parasitic diseases: Secondary | ICD-10-CM

## 2020-09-23 MED ORDER — SENSITIVE TOOTHPASTE/FLUORIDE 5-0.243 % DT PSTE: PASTE | DENTAL | 3 refills | Status: DC

## 2020-09-23 MED ORDER — OLOPATADINE HCL 0.1 % OP SOLN: 1.0000 [drp] | Freq: Two times a day (BID) | OPHTHALMIC | 3 refills | Status: DC

## 2020-09-23 MED ORDER — HYDROCHLOROTHIAZIDE 25 MG PO TABS: ORAL_TABLET | ORAL | 3 refills | Status: DC

## 2020-09-23 MED ORDER — VALACYCLOVIR HCL 1 G PO TABS: ORAL_TABLET | ORAL | 3 refills | Status: DC

## 2020-11-11 ENCOUNTER — Encounter: Payer: BC Managed Care – PPO | Admitting: Family Medicine

## 2020-12-02 ENCOUNTER — Encounter: Payer: BC Managed Care – PPO | Admitting: Family Medicine

## 2020-12-12 ENCOUNTER — Other Ambulatory Visit: Payer: Self-pay | Admitting: Obstetrics and Gynecology

## 2020-12-12 DIAGNOSIS — Z1231 Encounter for screening mammogram for malignant neoplasm of breast: Secondary | ICD-10-CM

## 2021-02-09 ENCOUNTER — Ambulatory Visit: Payer: BC Managed Care – PPO

## 2021-02-27 ENCOUNTER — Other Ambulatory Visit: Payer: Self-pay | Admitting: Family Medicine

## 2021-02-28 NOTE — Telephone Encounter (Signed)
Ok to refill??  Last office visit 01/11/2020.  Last refill 09/18/2019.

## 2021-03-07 ENCOUNTER — Encounter: Payer: Self-pay | Admitting: Family Medicine

## 2021-03-07 ENCOUNTER — Ambulatory Visit
Admission: RE | Admit: 2021-03-07 | Discharge: 2021-03-07 | Disposition: A | Discharge status: Home / Self Care | Payer: BC Managed Care – PPO | Source: Ambulatory Visit | Attending: Obstetrics and Gynecology | Admitting: Obstetrics and Gynecology

## 2021-03-07 ENCOUNTER — Other Ambulatory Visit: Payer: Self-pay

## 2021-03-07 ENCOUNTER — Ambulatory Visit (INDEPENDENT_AMBULATORY_CARE_PROVIDER_SITE_OTHER): Payer: BC Managed Care – PPO | Admitting: Family Medicine

## 2021-03-07 VITALS — BP 130/82 | HR 69 | Temp 97.3°F | Resp 16 | Ht 62.0 in | Wt 183.0 lb

## 2021-03-07 DIAGNOSIS — I10 Essential (primary) hypertension: Secondary | ICD-10-CM

## 2021-03-07 DIAGNOSIS — Z0001 Encounter for general adult medical examination with abnormal findings: Secondary | ICD-10-CM | POA: Diagnosis not present

## 2021-03-07 DIAGNOSIS — E538 Deficiency of other specified B group vitamins: Secondary | ICD-10-CM | POA: Diagnosis not present

## 2021-03-07 DIAGNOSIS — E559 Vitamin D deficiency, unspecified: Secondary | ICD-10-CM

## 2021-03-07 DIAGNOSIS — Z0000 Encounter for general adult medical examination without abnormal findings: Secondary | ICD-10-CM

## 2021-03-07 DIAGNOSIS — E785 Hyperlipidemia, unspecified: Secondary | ICD-10-CM | POA: Diagnosis not present

## 2021-03-07 DIAGNOSIS — Z6833 Body mass index (BMI) 33.0-33.9, adult: Secondary | ICD-10-CM

## 2021-03-07 DIAGNOSIS — Z8619 Personal history of other infectious and parasitic diseases: Secondary | ICD-10-CM

## 2021-03-07 DIAGNOSIS — Z1231 Encounter for screening mammogram for malignant neoplasm of breast: Secondary | ICD-10-CM

## 2021-03-07 DIAGNOSIS — E6609 Other obesity due to excess calories: Secondary | ICD-10-CM

## 2021-03-07 MED ORDER — FLUTICASONE PROPIONATE 50 MCG/ACT NA SUSP: 2.0000 | Freq: Every day | NASAL | 3 refills | Status: DC

## 2021-03-07 MED ORDER — VALACYCLOVIR HCL 1 G PO TABS
ORAL_TABLET | ORAL | 3 refills | Status: DC
Start: 2021-03-07 — End: 2021-09-20

## 2021-03-07 MED ORDER — OLOPATADINE HCL 0.1 % OP SOLN: 1.0000 [drp] | Freq: Two times a day (BID) | OPHTHALMIC | 3 refills | Status: AC

## 2021-03-07 MED ORDER — CLOBETASOL PROPIONATE 0.05 % EX CREA: TOPICAL_CREAM | CUTANEOUS | 0 refills | Status: DC

## 2021-03-07 MED ORDER — PHENYTOIN SODIUM EXTENDED 100 MG PO CAPS: 200.0000 mg | ORAL_CAPSULE | Freq: Every evening | ORAL | 3 refills | Status: DC

## 2021-03-07 MED ORDER — FINASTERIDE 5 MG PO TABS: 2.5000 mg | ORAL_TABLET | Freq: Every day | ORAL | 1 refills | Status: DC

## 2021-03-07 MED ORDER — SENSITIVE TOOTHPASTE/FLUORIDE 5-0.243 % DT PSTE: PASTE | DENTAL | 3 refills | Status: AC

## 2021-03-07 MED ORDER — HYDROCHLOROTHIAZIDE 25 MG PO TABS: ORAL_TABLET | ORAL | 3 refills | Status: DC

## 2021-03-07 MED ORDER — TRAZODONE HCL 50 MG PO TABS: 25.0000 mg | ORAL_TABLET | Freq: Every evening | ORAL | 3 refills | Status: DC | PRN

## 2021-03-07 MED ORDER — LORAZEPAM 0.5 MG PO TABS: ORAL_TABLET | ORAL | 1 refills | Status: DC

## 2021-03-07 MED ORDER — CETIRIZINE HCL 10 MG PO TABS: 10.0000 mg | ORAL_TABLET | Freq: Every day | ORAL | 3 refills | Status: DC

## 2021-03-07 NOTE — Progress Notes (Signed)
   Subjective:    Patient ID: Lisa [name], female    DOB: 1961-07-23, 60 y.o.   MRN: [id number]  Patient presents for Annual Exam  Pt here for CPE Medication reviewed  HTN- she is taking 1/2 tab of HCTZ, no SE with meds   Mammogram scheduled for today   PAP UTD, has GYN next week   Shingles vaccine - Friday 3/4  Dentist - has appt tomorrow   Eye doctor has astigmatism   She is followed by dermatology for her alopecia, getting a series of PRP into scalp which has helped  She has gained 20lbs since her visit she went off her Keto and she has not been exercising   She has some anxiety over current world affairs with [place], her son is stationed in [place] and she is fearful about his safety and war  Review Of Systems:  GEN- denies fatigue, fever, weight loss,weakness, recent illness HEENT- denies eye drainage, change in vision, nasal discharge, CVS- denies chest pain, palpitations RESP- denies SOB, cough, wheeze ABD- denies N/V, change in stools, abd pain GU- denies dysuria, hematuria, dribbling, incontinence MSK- denies joint pain, muscle aches, injury Neuro- denies headache, dizziness, syncope, seizure activity       Objective:    BP 130/82   Pulse 69   Temp (!) 97.3 F (36.3 C) (Temporal)   Resp 16   Ht 5\' 2"  (1.575 m)   Wt 183 lb (83 kg)   LMP 12/31/2004 (Approximate)   SpO2 95%   BMI 33.47 kg/m  GEN- NAD, alert and oriented x3 HEENT- PERRL, EOMI, non injected sclera, pink conjunctiva, MMM, oropharynx clear Neck- Supple, no thyromegaly CVS- RRR, no murmur RESP-CTAB ABD-NABS,soft,NT,ND Psych normal affect and mood EXT- No edema Pulses- Radial, DP- 2+        Assessment & Plan:      Problem List Items Addressed This Visit      Unprioritized   B12 deficiency   Relevant Orders   Vitamin B12   Essential hypertension    Mildly elevated in setting of weight gain Discussed lifestyle dietary changes Will return to low carb even though she  may not be KETO as this worked well for her body Goal try to keep carbs to less than 100gram a day       Relevant Medications   hydrochlorothiazide (HYDRODIURIL) 25 MG tablet   Other Relevant Orders   CBC with Differential/Platelet   Comprehensive metabolic panel   TSH   Hyperlipidemia    Recheck lipids on statin      Relevant Medications   hydrochlorothiazide (HYDRODIURIL) 25 MG tablet   Other Relevant Orders   Lipid panel   Obese   Vitamin D deficiency   Relevant Orders   Vitamin D, 25-hydroxy    Other Visit Diagnoses    Routine general medical examination at a health care facility    -  Primary   CPE done, fasting labs obtained,   History of herpes genitalis       Relevant Medications   valACYclovir (VALTREX) 1000 MG tablet      Note: This dictation was prepared with Dragon dictation along with smaller phrase technology. Any transcriptional errors that result from this process are unintentional.

## 2021-03-07 NOTE — Assessment & Plan Note (Signed)
Mildly elevated in setting of weight gain Discussed lifestyle dietary changes Will return to low carb even though she may not be KETO as this worked well for her body Goal try to keep carbs to less than 100gram a day

## 2021-03-07 NOTE — Patient Instructions (Addendum)
F/U 6 months with Janett Billow  I have refilled medications

## 2021-03-07 NOTE — Assessment & Plan Note (Signed)
Recheck lipids on statin

## 2021-03-08 ENCOUNTER — Other Ambulatory Visit: Payer: Self-pay | Admitting: *Deleted

## 2021-03-08 LAB — COMPREHENSIVE METABOLIC PANEL
AG Ratio: 1.8 (calc) (ref 1.0–2.5)
ALT: 14 U/L (ref 6–29)
AST: 19 U/L (ref 10–35)
Albumin: 4.2 g/dL (ref 3.6–5.1)
Alkaline phosphatase (APISO): 58 U/L (ref 37–153)
BUN: 15 mg/dL (ref 7–25)
CO2: 22 mmol/L (ref 20–32)
Calcium: 9.2 mg/dL (ref 8.6–10.4)
Chloride: 103 mmol/L (ref 98–110)
Creat: 0.96 mg/dL (ref 0.50–1.05)
Globulin: 2.4 g/dL (calc) (ref 1.9–3.7)
Glucose, Bld: 97 mg/dL (ref 65–99)
Potassium: 4.2 mmol/L (ref 3.5–5.3)
Sodium: 138 mmol/L (ref 135–146)
Total Bilirubin: 0.3 mg/dL (ref 0.2–1.2)
Total Protein: 6.6 g/dL (ref 6.1–8.1)

## 2021-03-08 LAB — LIPID PANEL
Cholesterol: 266 mg/dL — ABNORMAL HIGH (ref <200)
HDL: 71 mg/dL (ref >=50)
LDL Cholesterol (Calc): 171 mg/dL (calc) — ABNORMAL HIGH
Non-HDL Cholesterol (Calc): 195 mg/dL (calc) — ABNORMAL HIGH (ref <130)
Total CHOL/HDL Ratio: 3.7 (calc) (ref <5.0)
Triglycerides: 116 mg/dL (ref <150)

## 2021-03-08 LAB — CBC WITH DIFFERENTIAL/PLATELET
Absolute Monocytes: 515 cells/uL (ref 200–950)
Basophils Absolute: 20 cells/uL (ref 0–200)
Basophils Relative: 0.4 %
Eosinophils Absolute: 71 cells/uL (ref 15–500)
Eosinophils Relative: 1.4 %
HCT: 40.1 % (ref 35.0–45.0)
Hemoglobin: 13.5 g/dL (ref 11.7–15.5)
Lymphs Abs: 2525 cells/uL (ref 850–3900)
MCH: 29.3 pg (ref 27.0–33.0)
MCHC: 33.7 g/dL (ref 32.0–36.0)
MCV: 87 fL (ref 80.0–100.0)
MPV: 11 fL (ref 7.5–12.5)
Monocytes Relative: 10.1 %
Neutro Abs: 1969 cells/uL (ref 1500–7800)
Neutrophils Relative %: 38.6 %
Platelets: 256 10*3/uL (ref 140–400)
RBC: 4.61 10*6/uL (ref 3.80–5.10)
RDW: 13.4 % (ref 11.0–15.0)
Total Lymphocyte: 49.5 %
WBC: 5.1 10*3/uL (ref 3.8–10.8)

## 2021-03-08 LAB — VITAMIN D 25 HYDROXY (VIT D DEFICIENCY, FRACTURES): Vit D, 25-Hydroxy: 63 ng/mL (ref 30–100)

## 2021-03-08 LAB — VITAMIN B12: Vitamin B-12: 476 pg/mL (ref 200–1100)

## 2021-03-08 LAB — TSH: TSH: 0.78 mIU/L (ref 0.40–4.50)

## 2021-03-08 MED ORDER — CETIRIZINE HCL 10 MG PO TABS: 10.0000 mg | ORAL_TABLET | Freq: Every day | ORAL | 3 refills | Status: AC

## 2021-03-08 MED ORDER — LORAZEPAM 0.5 MG PO TABS: ORAL_TABLET | ORAL | 1 refills | Status: DC

## 2021-03-08 NOTE — Telephone Encounter (Signed)
Received call from [place].   Reports that fax/ e-scribe is not available for controlled substances a this time. Advised that paper prescription can be hand delivered by patient, or it can be sent to retail.   Call placed to patient. States that she would prefer to pick up prescription and take to pharmacy. Prescription printed.

## 2021-03-09 ENCOUNTER — Encounter: Payer: Self-pay | Admitting: Family Medicine

## 2021-03-10 MED ORDER — LORAZEPAM 0.5 MG PO TABS: ORAL_TABLET | ORAL | 1 refills | Status: DC

## 2021-03-14 NOTE — Progress Notes (Signed)
60 y.o. [id number] Widowed [place] American female here for annual exam.    Has had all 3 COVID vaccines--Pfizer.  PCP: Milinda Antis, MD  Patient's last menstrual period was 12/31/2004 (approximate).           Sexually active: No.  The current method of family planning is status post SUPRACERVICAL  hysterectomy.    Exercising: No.  The patient does not participate in regular exercise at present. Smoker:  no  Health Maintenance: Pap: 03-14-20 Neg:Neg HR HPV, 02-17-19 AGUS:Neg HR HPV, 01-04-16 Neg:Neg HR HPV History of abnormal Pap:  Yes, 02-17-19 Hx AGUS:Neg HR HPV;colpo with LGSIL on ECC and cervical biopsies MMG:  03-07-21 3D/Neg/biRads1 Colonoscopy: 2017 normal;10 years BMD:   n/a  Result  n/a TDaP:  2018 Gardasil:   no HIV: 2019 NR Hep C: 2019 Neg Screening Labs:  PCP   reports that she quit smoking about 24 years ago. Her smoking use included cigarettes. She has never used smokeless tobacco. She reports current alcohol use of about 1.0 - 2.0 standard drink of alcohol per week. She reports that she does not use drugs.  Past Medical History:  Diagnosis Date  . Abnormal Pap smear of cervix    2020 - AGUS pap, negative HR HPV, colposcopy showing LGSIL on biopsy and ECC  . Alopecia   . Carpal tunnel syndrome of right wrist   . Elevated cholesterol   . Family history of adverse reaction to anesthesia    sister has naseau  . HSV infection    Type I and II  . Hypertension   . Increased risk of breast cancer 2018   27.7 lifetime risk.  Yearly MRI of breast.   . Low back pain   . OAB (overactive bladder)   . Pneumonia   . Seizures ([facility])     Past Surgical History:  Procedure Laterality Date  . ABDOMINAL HYSTERECTOMY  06/23/2004   supracervical hysterectomy.  Ovaries remain.  Marland Kitchen CESAREAN SECTION    . CRYOTHERAPY    . ECTOPIC PREGNANCY SURGERY    . LUMBAR LAMINECTOMY/DECOMPRESSION MICRODISCECTOMY Left 05/07/2017   Procedure: Decompression L4-L5 and excision of synovial cyst Left;   Surgeon: Ranee Gosselin, MD;  Location: [facility];  Service: Orthopedics;  Laterality: Left;  . TRIGGER FINGER RELEASE      Current Outpatient Medications  Medication Sig Dispense Refill  . cetirizine (ZYRTEC) 10 MG tablet Take 1 tablet (10 mg total) by mouth daily. 90 tablet 3  . Cholecalciferol (VITAMIN D) 50 MCG (2000 UT) CAPS Take 1 capsule (2,000 Units total) by mouth daily. 30 capsule   . clobetasol cream (TEMOVATE) 0.05 % Once daily three times a week 30 g 0  . Dentifrices (SENSITIVE TOOTHPASTE/FLUORIDE) 5-0.243 % PSTE Use to brush teeth 2x daily. 300 g 3  . finasteride (PROSCAR) 5 MG tablet Take 0.5 tablets (2.5 mg total) by mouth daily. 90 tablet 1  . fluticasone (FLONASE) 50 MCG/ACT nasal spray Place 2 sprays into both nostrils daily. 48 g 3  . hydrochlorothiazide (HYDRODIURIL) 25 MG tablet Take 1/2 tablet once a day 45 tablet 3  . LORazepam (ATIVAN) 0.5 MG tablet TAKE 1 TABLET(0.5 MG) BY MOUTH TWICE DAILY AS NEEDED FOR ANXIETY 30 tablet 1  . olopatadine (PATANOL) 0.1 % ophthalmic solution Place 1 drop into both eyes 2 (two) times daily. 15 mL 3  . Omega-3 Fatty Acids (FISH OIL) 1000 MG CAPS Take 1 capsule daily 30 capsule 0  . phenytoin (DILANTIN) 100 MG ER capsule Take  2 capsules (200 mg total) by mouth every evening. 180 capsule 3  . Sod Fluoride-Potassium Nitrate (PREVIDENT 5000 SENSITIVE) 1.1-5 % PSTE PreviDent 5000 Sensitive 1.1 %-5 % dental paste 100 mL 5  . traZODone (DESYREL) 50 MG tablet Take 0.5-1 tablets (25-50 mg total) by mouth at bedtime as needed for sleep. 30 tablet 3  . UNABLE TO FIND Nutrafol supplement    . valACYclovir (VALTREX) 1000 MG tablet (1/2) tablet daily for suppression- may increase to twice daily x3 days at onset of outbreak 45 tablet 3  . vitamin C (ASCORBIC ACID) 500 MG tablet Take 2 tablets (1,000 mg total) by mouth daily.     No current facility-administered medications for this visit.    Family History  Problem Relation Age of Onset  . Breast  cancer Mother 27       breast, recurrence 5 ys later to other breast then mastectomy  . Hypertension Mother   . Lung cancer Father 98       lung   . Diabetes Father   . Hypertension Father   . Breast cancer Sister 29       breast wih recurrence 10 yrs later and bilateral mastectomy  . Hypertension Sister     Review of Systems  All other systems reviewed and are negative.   Exam:   BP 134/76 (Cuff Size: Large)   Pulse 70   Ht 5\' 1"  (1.549 m)   Wt 179 lb (81.2 kg)   LMP 12/31/2004 (Approximate)   SpO2 98%   BMI 33.82 kg/m     General appearance: alert, cooperative and appears stated age Head: normocephalic, without obvious abnormality, atraumatic Neck: no adenopathy, supple, symmetrical, trachea midline and thyroid normal to inspection and palpation Lungs: clear to auscultation bilaterally Breasts: normal appearance, no masses or tenderness, No nipple retraction or dimpling, No nipple discharge or bleeding, No axillary adenopathy Heart: regular rate and rhythm Abdomen: soft, non-tender; no masses, no organomegaly Extremities: extremities normal, atraumatic, no cyanosis or edema Skin: skin color, texture, turgor normal. No rashes or lesions Lymph nodes: cervical, supraclavicular, and axillary nodes normal. Neurologic: grossly normal  Pelvic: External genitalia:  no lesions              No abnormal inguinal nodes palpated.              Urethra:  normal appearing urethra with no masses, tenderness or lesions              Bartholins and Skenes: normal                 Vagina: normal appearing vagina with normal color and discharge, no lesions              Cervix: no lesions              Pap taken: Yes.   Bimanual Exam:  Uterus: absent              Adnexa: no mass, fullness, tenderness              Rectal exam: Yes.  .  Confirms.              Anus:  normal sphincter tone, no lesions  Chaperone was present for exam.  Assessment:   Well woman visit with normal exam. Status  post supracervical hysterectomy.  Ovaries remain.  Hx AGUS pap.  LGSIL on colposcopic biopsy FH breast cancer sister and mother.  Hx HSV I and II.  Plan: Mammogram screening discussed. Self breast awareness reviewed. Pap and HR HPV as above. Guidelines for Calcium, Vitamin D, regular exercise program including cardiovascular and weight bearing exercise. Referral to genetic counseling and testing.  Breast MRI.  Has Rx for Valtrex.  Follow up annually and prn.

## 2021-03-15 ENCOUNTER — Encounter: Payer: Self-pay | Admitting: Obstetrics and Gynecology

## 2021-03-15 ENCOUNTER — Other Ambulatory Visit (HOSPITAL_COMMUNITY)
Admission: RE | Admit: 2021-03-15 | Discharge: 2021-03-15 | Disposition: A | Discharge status: Home / Self Care | Payer: BC Managed Care – PPO | Source: Ambulatory Visit | Attending: Obstetrics and Gynecology | Admitting: Obstetrics and Gynecology

## 2021-03-15 ENCOUNTER — Ambulatory Visit (INDEPENDENT_AMBULATORY_CARE_PROVIDER_SITE_OTHER): Payer: BC Managed Care – PPO | Admitting: Obstetrics and Gynecology

## 2021-03-15 ENCOUNTER — Other Ambulatory Visit: Payer: Self-pay

## 2021-03-15 VITALS — BP 134/76 | HR 70 | Ht 61.0 in | Wt 179.0 lb

## 2021-03-15 DIAGNOSIS — Z01419 Encounter for gynecological examination (general) (routine) without abnormal findings: Secondary | ICD-10-CM | POA: Insufficient documentation

## 2021-03-15 DIAGNOSIS — Z9189 Other specified personal risk factors, not elsewhere classified: Secondary | ICD-10-CM

## 2021-03-15 NOTE — Patient Instructions (Signed)

## 2021-03-16 ENCOUNTER — Telehealth: Payer: Self-pay | Admitting: Genetic Counselor

## 2021-03-16 NOTE — Telephone Encounter (Signed)
Received a genetic counseling referral from Dr. Quincy Simmonds for a fhx of breast cancer in mom and sister. Ms. Musso has been cld and scheduled to see Cari on 5/23 at 10am. Pt preferred to be scheduled in May due to her work schedule.

## 2021-03-17 LAB — CYTOLOGY - PAP
Comment: NEGATIVE
Diagnosis: NEGATIVE
High risk HPV: NEGATIVE

## 2021-03-28 ENCOUNTER — Telehealth: Payer: Self-pay | Admitting: Genetic Counselor

## 2021-03-28 NOTE — Telephone Encounter (Signed)
Ms. Braaten cld to reschedule her genetic counseling appt w/Cari to 6/23 at 9am.

## 2021-05-20 ENCOUNTER — Other Ambulatory Visit: Payer: BC Managed Care – PPO

## 2021-05-22 ENCOUNTER — Encounter: Payer: BC Managed Care – PPO | Admitting: Genetic Counselor

## 2021-05-22 ENCOUNTER — Other Ambulatory Visit: Payer: BC Managed Care – PPO

## 2021-06-19 ENCOUNTER — Encounter: Payer: Self-pay | Admitting: Nurse Practitioner

## 2021-06-21 ENCOUNTER — Other Ambulatory Visit: Payer: Self-pay | Admitting: Nurse Practitioner

## 2021-06-21 MED ORDER — NYSTATIN 100000 UNIT/GM EX POWD: CUTANEOUS | 2 refills | Status: DC

## 2021-06-22 ENCOUNTER — Inpatient Hospital Stay: Payer: BC Managed Care – PPO

## 2021-06-22 ENCOUNTER — Other Ambulatory Visit: Payer: Self-pay

## 2021-06-22 ENCOUNTER — Ambulatory Visit
Admission: RE | Admit: 2021-06-22 | Discharge: 2021-06-22 | Disposition: A | Discharge status: Home / Self Care | Payer: BC Managed Care – PPO | Source: Ambulatory Visit | Attending: Obstetrics and Gynecology | Admitting: Obstetrics and Gynecology

## 2021-06-22 ENCOUNTER — Inpatient Hospital Stay: Payer: BC Managed Care – PPO | Attending: Genetic Counselor | Admitting: Genetic Counselor

## 2021-06-22 ENCOUNTER — Encounter: Payer: Self-pay | Admitting: Genetic Counselor

## 2021-06-22 DIAGNOSIS — Z800 Family history of malignant neoplasm of digestive organs: Secondary | ICD-10-CM

## 2021-06-22 DIAGNOSIS — Z803 Family history of malignant neoplasm of breast: Secondary | ICD-10-CM

## 2021-06-22 DIAGNOSIS — Z9189 Other specified personal risk factors, not elsewhere classified: Secondary | ICD-10-CM

## 2021-06-22 DIAGNOSIS — Z8051 Family history of malignant neoplasm of kidney: Secondary | ICD-10-CM | POA: Diagnosis not present

## 2021-06-22 DIAGNOSIS — Z801 Family history of malignant neoplasm of trachea, bronchus and lung: Secondary | ICD-10-CM

## 2021-06-22 HISTORY — DX: Family history of malignant neoplasm of digestive organs: Z80.0

## 2021-06-22 LAB — GENETIC SCREENING ORDER

## 2021-06-22 MED ORDER — GADOBUTROL 1 MMOL/ML IV SOLN
8.0000 mL | Freq: Once | INTRAVENOUS | Status: AC | PRN
  Administered 2021-06-22: 8 mL via INTRAVENOUS

## 2021-06-22 NOTE — Progress Notes (Signed)
REFERRING PROVIDER: Nunzio Cobbs, MD [place],  [place] [address]  PRIMARY PROVIDER:  Alycia Rossetti, MD  PRIMARY REASON FOR VISIT:  1. Family history of breast cancer   2. Family history of pancreatic cancer     HISTORY OF PRESENT ILLNESS:   Ms. Sonn, a 60 y.o. female, was seen for a  cancer genetics consultation at the request of Dr. Yisroel Ramming due to a family history of cancer.  Ms. Ury presents to clinic today to discuss the possibility of a hereditary predisposition to cancer, to discuss genetic testing, and to further clarify her future cancer risks, as well as potential cancer risks for family members.   IMs. Matthew is a 60 y.o. female with no personal history of cancer.    CANCER HISTORY:  Oncology History   No history exists.     RISK FACTORS:  Menarche was at age 53 or 78.  First live birth at age 37.  OCP use for approximately  20  years.  Ovaries intact: yes.  Hysterectomy: yes. Supracervical hysterectomy in 2005 HRT use: 0 years. Colonoscopy: yes;  most recent in 2017; plans to f/u in 10 year intervals . Mammogram within the last year: yes; most recent 02/2021 Breast MRI: scheduled; recommended annually per radiology Number of breast biopsies: 0. 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  Yearly MRI of breast.    Low back pain    OAB (overactive bladder)    Pneumonia     Seizures ([place])     Past Surgical History:  Procedure Laterality Date   ABDOMINAL HYSTERECTOMY  06/23/2004   supracervical hysterectomy.  Ovaries remain.   CESAREAN SECTION     CRYOTHERAPY     ECTOPIC PREGNANCY SURGERY     LUMBAR LAMINECTOMY/DECOMPRESSION MICRODISCECTOMY Left 05/07/2017   Procedure: Decompression L4-L5 and excision of synovial cyst Left;  Surgeon: Latanya Maudlin, MD;  Location: [facility];  Service: Orthopedics;  Laterality: Left;   TRIGGER FINGER RELEASE      Social History   Socioeconomic History   Marital status: Widowed    Spouse name: Not on file   Number of children: Not on file   Years of education: Not on file   Highest education level: Not on file  Occupational History   Not on file  Tobacco Use   Smoking status: Former    Pack years: 0.00    Types: Cigarettes    Quit date: 12/31/1996    Years since quitting: 24.4   Smokeless tobacco: Never  Vaping Use   Vaping Use: Never used  Substance and Sexual Activity   Alcohol use: Yes    Alcohol/week: 1.0 - 2.0 standard drink    Types: 1 - 2 Standard drinks or equivalent per week   Drug use: No  Sexual activity: Not Currently    Birth control/protection: Surgical, Post-menopausal    Comment: hysterectomy  Other Topics Concern   Not on file  Social History Narrative   Not on file   Social Determinants of Health   Financial Resource Strain: Not on file  Food Insecurity: Not on file  Transportation Needs: Not on file  Physical Activity: Not on file  Stress: Not on file  Social Connections: Not on file     FAMILY HISTORY:  We obtained a detailed, 4-generation family history.  Significant diagnoses are listed below: Family History  Problem Relation Age of Onset   Breast cancer Mother 14       second primary at age 52   Lung cancer Father 38       smoking hx   Diabetes Father    Hypertension Father    Breast cancer Sister 4       recurrence 10 yrs later and bilateral mastectomies   Breast  cancer Maternal Aunt        dx before 25, x4   Breast cancer Maternal Aunt        dx after 50   Pancreatic cancer Maternal Aunt    Kidney cancer Maternal Grandmother        dx 83s   Breast cancer Cousin        several female maternal cousins with premenopausal breast cancer    Ms. Samonte is unaware of previous family history of genetic testing for hereditary cancer risks. Close relatives with a personal history of cancer (mother, sister) are unavailable for testing at this point in time. There is no reported Ashkenazi Jewish ancestry. There is no known consanguinity.  GENETIC COUNSELING ASSESSMENT: Ms. Mohammed is a 60 y.o. female with a family history of cancer which is somewhat suggestive of a hereditary cancer syndrome and predisposition to cancer given the presence of premenopausal breast cancer in multiple generations. We, therefore, discussed and recommended the following at today's visit.   DISCUSSION: We discussed that 5 - 10% of cancer is hereditary, with most cases of hereditary breast cancer associated with mutations in BRCA1/2.  There are other genes that can be associated with hereditary breast cancer syndromes.  These include PALB2, CHEK2, and ATM.  We discussed that testing is beneficial for several reasons, including knowing about other cancer risks, identifying potential screening and risk-reduction options that may be appropriate, and to understanding if other family members could be at risk for cancer and allowing them to undergo genetic testing.  We reviewed the characteristics, features and inheritance patterns of hereditary cancer syndromes. We also discussed genetic testing, including the appropriate family members to test, the process of testing, insurance coverage and turn-around-time for results. We discussed the implications of a negative, positive, carrier and/or variant of uncertain significant result. We discussed that negative results would be uninformative given that  Ms. Dokken does not have a personal history of cancer. We recommended Ms. Pruss pursue genetic testing for a panel that contains genes associated with breast and pancreatic cancer.  Ms. Settle was offered a common hereditary cancer panel (40+ genes) and an expanded pan-cancer panel (80+ genes). Ms. Spelman was informed of the benefits and limitations of each panel, including that expanded pan-cancer panels contain several genes that do not have clear management guidelines at this point in time.  We also discussed that as the number of genes included on a panel increases, the chances of variants of uncertain significance increases.  After considering the  benefits and limitations of each gene panel, Ms. Severe elected to have an expanded Radio broadcast assistant through [place].  The CustomNext-Cancer +RNAinsight Panel offered by [facility] and includes sequencing and rearrangement analysis for the following 91 genes: AIP, ALK, APC, ATM, AXIN2, BAP1, BARD1, BLM, BMPR1A, BRCA1, BRCA2, BRIP1, CDC73, CDH1, CDK4, CDKN1B, CDKN2A, CHEK2, CTNNA1, DICER1, FANCC, FH, FLCN, GALNT12, KIF1B, LZTR1, MAX, MEN1, MET, MLH1, MRE11A, MSH2, MSH3, MSH6, MUTYH, NBN, NF1, NF2, NTHL1, PALB2, PHOX2B, PMS2, POT1, PRKAR1A, PTCH1, PTEN, RAD50, RAD51C, RAD51D, RB1, RECQL, RET, SDHA, SDHAF2, SDHB, SDHC, SDHD, SMAD4, SMARCA4, SMARCB1, SMARCE1, STK11, SUFU, TMEM127, TP53, TSC1, TSC2, VHL and XRCC2 (sequencing and deletion/duplication); CASR, CFTR, CPA1, CTRC, EGFR, EGLN1, FAM175A, HOXB13, KIT, MITF, MLH3, PALLD, PDGFRA, POLD1, POLE, PRSS1, RINT1, RPS20, SPINK1 and TERT (sequencing only); EPCAM and GREM1 (deletion/duplication only). RNA data is routinely analyzed for use in variant interpretation for all genes.  Based on Ms. Ozier's family history of premenopausal breast cancer, she meets medical criteria for genetic testing. Despite that she meets criteria, she may still have an out of pocket cost. We discussed that if her out of pocket cost  for testing is over $100, the laboratory should contact her to discuss self-pay options and/or patient pay assistance programs.    We discussed that some people do not want to undergo genetic testing due to fear of genetic discrimination.  A federal law called the Genetic Information Non-Discrimination Act (GINA) of 2008 helps protect individuals against genetic discrimination based on their genetic test results.  It impacts both health insurance and employment.  With health insurance, it protects against increased premiums, being kicked off insurance or being forced to take a test in order to be insured.  For employment it protects against hiring, firing and promoting decisions based on genetic test results.  GINA does not apply to those in the [employer], those who work for companies with less than 15 employees, and new life insurance or long-term disability insurance policies.  Health status due to a cancer diagnosis is not protected under GINA.  PLAN: After considering the risks, benefits, and limitations, Ms. Tafolla provided informed consent to pursue genetic testing and the blood sample was sent to [facility] for analysis of the CustomNext-Cancer +RNAinsight Panel. Results should be available within approximately 3 weeks' time, at which point they will be disclosed by telephone to Ms. Vanpelt, as will any additional recommendations warranted by these results. Ms. Pieratt will receive a summary of her genetic counseling visit and a copy of her results once available. This information will also be available in Epic.   Lastly, we encouraged Ms. Magan to remain in contact with cancer genetics annually so that we can continuously update the family history and inform her of any changes in cancer genetics and testing that may be of benefit for this family.   Ms. Kluver questions were answered to her satisfaction today. Our contact information was provided should additional questions or concerns  arise. Thank you for the referral and allowing [place] to share in the care of your patient.   Saraih Lorton M. Joette Catching, [place], [facility] Genetic Counselor [email or web address] (P) [phone]   The patient was seen for a total of 35 minutes in face-to-face genetic counseling.  The patient was seen alone.  Drs. Magrinat, Lindi Adie and/or Burr Medico were available to discuss this case as needed.  _______________________________________________________________________ For Office Staff:  Number of people involved in session: 1 Was an Intern/ student involved with case: no

## 2021-07-12 ENCOUNTER — Encounter: Payer: Self-pay | Admitting: Genetic Counselor

## 2021-07-12 ENCOUNTER — Ambulatory Visit: Payer: Self-pay | Admitting: Genetic Counselor

## 2021-07-12 ENCOUNTER — Telehealth: Payer: Self-pay | Admitting: Genetic Counselor

## 2021-07-12 DIAGNOSIS — Z1379 Encounter for other screening for genetic and chromosomal anomalies: Secondary | ICD-10-CM

## 2021-07-12 DIAGNOSIS — Z800 Family history of malignant neoplasm of digestive organs: Secondary | ICD-10-CM

## 2021-07-12 DIAGNOSIS — Z803 Family history of malignant neoplasm of breast: Secondary | ICD-10-CM

## 2021-07-12 NOTE — Telephone Encounter (Signed)
Revealed negative genetic testing and uncertain results in ATM and MSH6.  Discussed that we do not know  why there is cancer in the family. It could be familial, due to a gene mutation that she did not inherit, due to a different gene that we are not testing, or maybe our current technology may not be able to pick something up.  It will be important for her to keep in contact with genetics to keep up with whether additional testing may be needed.  Ms. Croy is interested in referral to high risk breast clinic.

## 2021-07-14 NOTE — Progress Notes (Addendum)
HPI:  Lisa [name] was previously seen in the [facility] due to a family history of cancer and concerns regarding a hereditary predisposition to cancer. Please refer to our prior cancer genetics clinic note for more information regarding our discussion, assessment and recommendations, at the time. Lisa [name] recent genetic test results were disclosed to her, as were recommendations warranted by these results. These results and recommendations are discussed in more detail below.  CANCER HISTORY:  Oncology History   No history exists.    FAMILY HISTORY:  We obtained a detailed, 4-generation family history.  Significant diagnoses are listed below: Family History  Problem Relation Age of Onset   Breast cancer Mother 30       second primary at age 43   Hypertension Mother    Lung cancer Father 70       smoking hx   Diabetes Father    Hypertension Father    Breast cancer Sister 37       recurrence 10 yrs later and bilateral mastectomies   Hypertension Sister    Breast cancer Maternal Aunt        dx before 38, x4   Breast cancer Maternal Aunt        dx after 50   Pancreatic cancer Maternal Aunt    Kidney cancer Maternal Grandmother        dx 73s   Breast cancer Cousin        several female maternal cousins with premenopausal breast cancer     Lisa [name] is unaware of previous family history of genetic testing for hereditary cancer risks. Close relatives with a personal history of cancer (mother, sister) are unavailable for testing at this point in time. There is no reported Ashkenazi Jewish ancestry. There is no known consanguinity.  GENETIC TEST RESULTS: Genetic testing reported out on July 09, 2021 through the CustomNext-Cancer +RNAinsight Panel found no pathogenic mutations. The CustomNext-Cancer +RNAinsight Panel offered by [facility] and includes sequencing and rearrangement analysis for the following 91 genes: AIP, ALK, APC, ATM, AXIN2, BAP1, BARD1,  BLM, BMPR1A, BRCA1, BRCA2, BRIP1, CDC73, CDH1, CDK4, CDKN1B, CDKN2A, CHEK2, CTNNA1, DICER1, FANCC, FH, FLCN, GALNT12, KIF1B, LZTR1, MAX, MEN1, MET, MLH1, MRE11A, MSH2, MSH3, MSH6, MUTYH, NBN, NF1, NF2, NTHL1, PALB2, PHOX2B, PMS2, POT1, PRKAR1A, PTCH1, PTEN, RAD50, RAD51C, RAD51D, RB1, RECQL, RET, SDHA, SDHAF2, SDHB, SDHC, SDHD, SMAD4, SMARCA4, SMARCB1, SMARCE1, STK11, SUFU, TMEM127, TP53, TSC1, TSC2, VHL and XRCC2 (sequencing and deletion/duplication); CASR, CFTR, CPA1, CTRC, EGFR, EGLN1, FAM175A, HOXB13, KIT, MITF, MLH3, PALLD, PDGFRA, POLD1, POLE, PRSS1, RINT1, RPS20, SPINK1 and TERT (sequencing only); EPCAM and GREM1 (deletion/duplication only). RNA data is routinely analyzed for use in variant interpretation for all genes.  The test report has been scanned into EPIC and is located under the Molecular Pathology section of the Results Review tab.  A portion of the result report is included below for reference.     We discussed with Lisa [name] that because current genetic testing is not perfect, it is possible there may be a gene mutation in one of these genes that current testing cannot detect, but that chance is small.  We also discussed, that there could be another gene that has not yet been discovered, or that we have not yet tested, that is responsible for the cancer diagnoses in the family. It is also possible there is a hereditary cause for the cancer in the family that Lisa [name] did not inherit and therefore was not identified in  her testing.  Therefore, it is important to remain in touch with cancer genetics in the future so that we can continue to offer Lisa [name] the most up to date genetic testing.   Genetic testing did identify two variants of uncertain significance (VUS) - one in the ATM gene called  p.M963V (c.2887A>G) and a second in the MSH6 gene called p.M841L (c.942C>G).  At this time, it is unknown if these variants are associated with increased cancer risk or if they are normal  findings, but most variants such as these get reclassified to being inconsequential. They should not be used to make medical management decisions. With time, we suspect the lab will determine the significance of these variants, if any. If we do learn more about them, we will try to contact Lisa [name] to discuss it further. However, it is important to stay in touch with [place] periodically and keep the address and phone number up to date.  Update: The variant of uncertain significance (VUS) in MSH6 at  p.S314R (c.942C>G) has been reclassified to likely benign.  The change in variant classification was made as a result of re-review of evidence in light of new variant interpretation guidelines and/or new information. The amended report date is August 22, 2021.   ADDITIONAL GENETIC TESTING: We discussed with Lisa [name] that her genetic testing was fairly extensive.  If there are genes identified to increase cancer risk that can be analyzed in the future, we would be happy to discuss and coordinate this testing at that time.    CANCER SCREENING RECOMMENDATIONS: Lisa [name] test result is considered negative (normal).  This means that we have not identified a hereditary cause for her family history of cancer at this time. Most cancers happen by chance and this negative test suggests that her cancer may fall into this category.    This does not definitively rule out a hereditary predisposition to cancer. It is still possible that there could be genetic mutations that are undetectable by current technology. There could be genetic mutations in genes that have not been tested or identified to increase cancer risk.  Therefore, it is recommended she continue to follow the cancer management and screening guidelines provided by her primary healthcare provider.   An individual's cancer risk and medical management are not determined by genetic test results alone. Overall cancer risk assessment incorporates additional  factors, including personal medical history, family history, and any available genetic information that may result in a personalized plan for cancer prevention and surveillance  Based on Lisa [name]'s family of cancer, as well as her genetic test results, statistical models Harriett Rush)  and literature data were used to estimate her risk of developing breast cancer. These estimate her lifetime risk of developing breast cancer to be approximately 21%  The patient's lifetime breast cancer risk is a preliminary estimate based on available information using one of several models endorsed by the [place] (ACS). The ACS recommends consideration of breast MRI screening as an adjunct to mammography for patients at high risk (defined as 20% or greater lifetime risk).     Lisa [name] has been determined to be at high risk for breast cancer. We, therefore, discussed that it is reasonable for Lisa [name] to be followed by a high-risk breast cancer clinic; in addition to a yearly mammogram and physical exam by a healthcare provider, she should discuss the usefulness of an annual breast MRI with the high-risk clinic providers.  RECOMMENDATIONS FOR FAMILY MEMBERS:  Individuals in this family might be at some increased risk of developing cancer, over the general population risk, simply due to the family history of cancer.  We recommended women in this family have a yearly mammogram beginning at age 81, or 26 years younger than the earliest onset of cancer, an annual clinical breast exam, and perform monthly breast self-exams. Women in this family should also have a gynecological exam as recommended by their primary provider. All family members should be referred for colonoscopy starting at age 26.  It is also possible there is a hereditary cause for the cancer in Lisa [name]'s family that she did not inherit and therefore was not identified in her.  Based on Lisa [name]'s family history, we recommended  her mother and sister, who were diagnosed with breast cancer, have genetic counseling and testing. They are not interested in testing at this time. Lisa [name] will let us know if we can be of any assistance in coordinating genetic counseling and/or testing for these family members in the future.   FOLLOW-UP: Lastly, we discussed with Lisa [name] that cancer genetics is a rapidly advancing field and it is possible that new genetic tests will be appropriate for her and/or her family members in the future. We encouraged her to remain in contact with cancer genetics on an annual basis so we can update her personal and family histories and let her know of advances in cancer genetics that may benefit this family.   Our contact number was provided. Lisa [name] questions were answered to her satisfaction, and she knows she is welcome to call us at anytime with additional questions or concerns.     Etheleen Valtierra M. Joette Catching, [place], [facility] Genetic Counselor [email or web address] (P) [phone]

## 2021-07-17 ENCOUNTER — Telehealth: Payer: Self-pay | Admitting: Hematology and Oncology

## 2021-07-17 NOTE — Telephone Encounter (Signed)
Received a referral for Ms. Lisa [name] to be seen in the high risk clinic. Ms. Lisa [name] has been cld and scheduled to see Dr. Chryl Heck on 7/27 at 11:20am. Pt aware to arrive 20 minutes early.

## 2021-07-26 ENCOUNTER — Other Ambulatory Visit: Payer: Self-pay

## 2021-07-26 ENCOUNTER — Telehealth: Payer: Self-pay

## 2021-07-26 ENCOUNTER — Inpatient Hospital Stay: Payer: BC Managed Care – PPO | Attending: Hematology and Oncology | Admitting: Hematology and Oncology

## 2021-07-26 ENCOUNTER — Encounter: Payer: Self-pay | Admitting: Hematology and Oncology

## 2021-07-26 VITALS — BP 168/90 | HR 62 | Temp 98.6°F | Resp 17 | Wt 173.1 lb

## 2021-07-26 DIAGNOSIS — Z803 Family history of malignant neoplasm of breast: Secondary | ICD-10-CM

## 2021-07-26 DIAGNOSIS — Z8051 Family history of malignant neoplasm of kidney: Secondary | ICD-10-CM

## 2021-07-26 DIAGNOSIS — Z1379 Encounter for other screening for genetic and chromosomal anomalies: Secondary | ICD-10-CM

## 2021-07-26 DIAGNOSIS — Z801 Family history of malignant neoplasm of trachea, bronchus and lung: Secondary | ICD-10-CM | POA: Diagnosis not present

## 2021-07-26 DIAGNOSIS — Z9189 Other specified personal risk factors, not elsewhere classified: Secondary | ICD-10-CM | POA: Diagnosis present

## 2021-07-26 DIAGNOSIS — Z800 Family history of malignant neoplasm of digestive organs: Secondary | ICD-10-CM | POA: Diagnosis not present

## 2021-07-26 DIAGNOSIS — Z87891 Personal history of nicotine dependence: Secondary | ICD-10-CM | POA: Insufficient documentation

## 2021-07-26 NOTE — Progress Notes (Signed)
[name] CONSULT NOTE  Patient Care Team: [name], Modena [name], MD as PCP - General (Family Medicine)  CHIEF COMPLAINTS/PURPOSE OF CONSULTATION:    ASSESSMENT & PLAN:  This is a very pleasant 60 year old female patient with family history of breast cancer referred to high risk breast cancer clinic for additional recommendations.  We discussed her life time risk of breast cancer and 5 yr risk of breast cancer today  2. We discussed different risk reducing strategies for future breast cancer risk. We discussed chemoprevention and lifestyle modification. We discussed role of ongoing surveillance with mammograms versus MRIs. Patient would be a good candidate for chemoprevention with  tamoxifen or Evista or aromatase inhibitors. We discussed different medications available. We discussed their particular side effects. With tamoxifen side effects would include but not limited to cataracts, increased risk of DVT/PE/cardiovascular events, uterine malignancy, hot flashes, and mood swings. With Evista patient would and could experience mood swings, hot flashes ,aches and pains , and possibly thrombosis. Aromatase inhibitors side effects also include but not limiting to bone loss, aches and pains, hyperlipidemia, hot flashes and mood swings.  Information printed and handed over to the patient. She will think about it.  3. Lifestyle modification: We discussed different interventions exercise at least 5 days a week including both aerobic as well as weight-bearing exercises and strength training. He discussed dietary modification including increasing number of servings of fruit and vegetables as well as decreased in number of servings of meat. We discussed the importance of maintaining a good BMI and limiting alcohol intake.  4. Surveillance: Patient certainly would be a good candidate for ongoing mammograms as well as annual MRIs on a yearly basis. Certainly she could benefit from a 3-D mammogram.  We discussed self breast examination as well as ongoing clinical examination.  We have discussed that long-term toxicity from gadolinium with repeated MRIs is unclear at this time.  MRIs are very sensitive however not as specific hence may sometimes need to additional biopsies.  She expressed understanding of the risks and she is willing to proceed with once a year MRIs for now.  Her OB has been taking care of the annual MRIs.  I would recommend staggering approach so she can get imaging every 6 months.  5. Chemoprevention: As mentioned above, discussed about tamoxifen and arimidex.  6. 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  This does not change any recommendations at this time.  She was informed to stay in touch periodically with the genetic counselor if the lab determines that these variants are indeed significant in the future.  7. Followup: Return to clinic in 2 weeks for telephone visit.   HISTORY OF PRESENTING ILLNESS:   Lisa [name] 60 y.o. female is here because of high risk for breast cancer  This is a very pleasant 60 year old female patient with no significant past medical history except for uncontrolled high blood pressure referred to high risk breast cancer clinic given her family history of breast cancer.  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  She remembers family history of breast cancer in her mom and her sister, both had cancer in their 57s and had second breast cancer.  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 She is here to discuss role of chemoprevention as well as MRI surveillance given her lifetime risk of breast cancer.  She is doing very well today, denies any major complaints.  No concerning review of systems.  She had 3 kids, age at first pregnancy of 31.  Used oral contraceptives for about 20 years.  No hormone replacement therapy.  REVIEW OF SYSTEMS:   Constitutional: Denies fevers,  chills or abnormal night sweats Eyes: Denies blurriness of vision, double vision or watery eyes Ears, nose, mouth, throat, and face: Denies mucositis or sore throat Respiratory: Denies cough, dyspnea or wheezes Cardiovascular: Denies palpitation, chest discomfort or lower extremity swelling Gastrointestinal:  Denies nausea, heartburn or change in bowel habits Skin: Denies abnormal skin rashes Lymphatics: Denies new lymphadenopathy or easy bruising Neurological:Denies numbness, tingling or new weaknesses Behavioral/Psych: Mood is stable, no new changes  All other systems were reviewed with the patient and are negative.  MEDICAL HISTORY:  Past Medical History:  Diagnosis Date   Abnormal Pap smear of cervix    2020 - AGUS pap, negative HR HPV, colposcopy showing LGSIL on biopsy and ECC   Alopecia    Carpal tunnel syndrome of right wrist    Elevated cholesterol    Family history of adverse reaction to anesthesia    sister has naseau   Family history of pancreatic cancer 06/22/2021   HSV infection    Type I and II   Hypertension    Increased risk of breast cancer 2018   27.7 lifetime risk.  Yearly MRI of breast.    Low back pain    OAB (overactive bladder)    Pneumonia    Seizures ([place])     SURGICAL HISTORY: Past Surgical History:  Procedure Laterality Date   ABDOMINAL HYSTERECTOMY  06/23/2004   supracervical hysterectomy.  Ovaries remain.   CESAREAN SECTION     CRYOTHERAPY     ECTOPIC PREGNANCY SURGERY     LUMBAR LAMINECTOMY/DECOMPRESSION MICRODISCECTOMY Left 05/07/2017   Procedure: Decompression L4-L5 and excision of synovial cyst Left;  Surgeon: Latanya Maudlin, MD;  Location: [facility];  Service: Orthopedics;  Laterality: Left;   TRIGGER FINGER RELEASE      SOCIAL HISTORY: Social History   Socioeconomic History   Marital status: Widowed    Spouse name: Not on file   Number of children: Not on file   Years of education: Not on file   Highest education level: Not on file   Occupational History   Not on file  Tobacco Use   Smoking status: Former    Types: Cigarettes    Quit date: 12/31/1996    Years since quitting: 24.5   Smokeless tobacco: Never  Vaping Use   Vaping Use: Never used  Substance and Sexual Activity   Alcohol use: Yes    Alcohol/week: 1.0 - 2.0 standard drink    Types: 1 - 2 Standard drinks or equivalent per week   Drug use: No   Sexual activity: Not Currently    Birth control/protection: Surgical, Post-menopausal    Comment: hysterectomy  Other Topics Concern   Not on file  Social History Narrative   Not on file   Social Determinants of Health   Financial Resource Strain: Not on file  Food Insecurity: Not on file  Transportation Needs: Not on file  Physical Activity: Not on file  Stress: Not on file  Social Connections: Not on file  Intimate Partner Violence: Not on file    FAMILY HISTORY: Family History  Problem Relation Age of Onset   Breast cancer Mother 37       second primary at age 69   Hypertension Mother    Lung cancer Father  50       smoking hx   Diabetes Father    Hypertension Father    Breast cancer Sister 47       recurrence 10 yrs later and bilateral mastectomies   Hypertension Sister    Breast cancer Maternal Aunt        dx before 40, x4   Breast cancer Maternal Aunt        dx after 50   Pancreatic cancer Maternal Aunt    Kidney cancer Maternal Grandmother        dx 14s   Breast cancer Cousin        several female maternal cousins with premenopausal breast cancer    ALLERGIES:  has No Known Allergies.  MEDICATIONS:  Current Outpatient Medications  Medication Sig Dispense Refill   cetirizine (ZYRTEC) 10 MG tablet Take 1 tablet (10 mg total) by mouth daily. 90 tablet 3   Cholecalciferol (VITAMIN D) 50 MCG (2000 UT) CAPS Take 1 capsule (2,000 Units total) by mouth daily. 30 capsule    clobetasol cream (TEMOVATE) 0.05 % Once daily three times a week 30 g 0   Dentifrices (SENSITIVE  TOOTHPASTE/FLUORIDE) 5-0.243 % PSTE Use to brush teeth 2x daily. 300 g 3   finasteride (PROSCAR) 5 MG tablet Take 0.5 tablets (2.5 mg total) by mouth daily. 90 tablet 1   fluticasone (FLONASE) 50 MCG/ACT nasal spray Place 2 sprays into both nostrils daily. 48 g 3   hydrochlorothiazide (HYDRODIURIL) 25 MG tablet Take 1/2 tablet once a day 45 tablet 3   LORazepam (ATIVAN) 0.5 MG tablet TAKE 1 TABLET(0.5 MG) BY MOUTH TWICE DAILY AS NEEDED FOR ANXIETY 30 tablet 1   nystatin powder Apply to affected areas three times a day as needed 60 g 2   olopatadine (PATANOL) 0.1 % ophthalmic solution Place 1 drop into both eyes 2 (two) times daily. 15 mL 3   Omega-3 Fatty Acids (FISH OIL) 1000 MG CAPS Take 1 capsule daily 30 capsule 0   phenytoin (DILANTIN) 100 MG ER capsule Take 2 capsules (200 mg total) by mouth every evening. 180 capsule 3   Sod Fluoride-Potassium Nitrate (PREVIDENT 5000 SENSITIVE) 1.1-5 % PSTE PreviDent 5000 Sensitive 1.1 %-5 % dental paste 100 mL 5   traZODone (DESYREL) 50 MG tablet Take 0.5-1 tablets (25-50 mg total) by mouth at bedtime as needed for sleep. 30 tablet 3   UNABLE TO FIND Nutrafol supplement     valACYclovir (VALTREX) 1000 MG tablet (1/2) tablet daily for suppression- may increase to twice daily x3 days at onset of outbreak 45 tablet 3   vitamin C (ASCORBIC ACID) 500 MG tablet Take 2 tablets (1,000 mg total) by mouth daily.     No current facility-administered medications for this visit.   PHYSICAL EXAMINATION: ECOG PERFORMANCE STATUS: 0 - Asymptomatic  Vitals:   07/26/21 1149  BP: (!) 168/90  Pulse: 62  Resp: 17  Temp: 98.6 F (37 C)  SpO2: 100%   Filed Weights   07/26/21 1149  Weight: 173 lb 1.6 oz (78.5 kg)   Physical exam deferred today, had recent breast MRI and in lieu of counseling.  LABORATORY DATA:  I have reviewed the data as listed Lab Results  Component Value Date   WBC 5.1 03/07/2021   HGB 13.5 03/07/2021   HCT 40.1 03/07/2021   MCV 87.0  03/07/2021   PLT 256 03/07/2021     Chemistry      Component Value Date/Time   NA 138  03/07/2021 0959   NA 138 06/03/2012 0000   K 4.2 03/07/2021 0959   CL 103 03/07/2021 0959   CO2 22 03/07/2021 0959   BUN 15 03/07/2021 0959   BUN 13 03/23/2013 0000   CREATININE 0.96 03/07/2021 0959   GLU 89 03/23/2013 0000      Component Value Date/Time   CALCIUM 9.2 03/07/2021 0959   ALKPHOS 95 06/03/2017 0913   AST 19 03/07/2021 0959   ALT 14 03/07/2021 0959   BILITOT 0.3 03/07/2021 0959     I have reviewed pertinent mammogram and MRI results.  Have reviewed pertinent genetic testing results and risk scores based on models calculated.  Lifetime risk of breast cancer per TC model is 21%. 5-year risk of breast cancer per [facility] model is 3.6%.  RADIOGRAPHIC STUDIES: I have personally reviewed the radiological images as listed and agreed with the findings in the report. No results found.  All questions were answered. The patient knows to call the clinic with any problems, questions or concerns. I spent 60 minutes in the care of this patient including H and P, review of records, counseling and coordination of care. We have discussed about chemoprevention, role of MRIs, unknown long-term toxicity of MRIs, increased biopsies with MRI surveillance, different options for chemoprevention, lifestyle interventions.    Benay Pike, MD 07/26/2021 12:51 PM

## 2021-07-26 NOTE — Telephone Encounter (Signed)
Attempted to call patient on primary number regarding today's appointment. No answer. Left voicemail providing patient with phone number to reschedule appointments.   

## 2021-08-14 ENCOUNTER — Encounter: Payer: Self-pay | Admitting: Hematology and Oncology

## 2021-08-14 NOTE — Progress Notes (Deleted)
[facility] CONSULT NOTE  Patient Care Team: [place], Modena [name], MD as PCP - General (Family Medicine)  CHIEF COMPLAINTS/PURPOSE OF CONSULTATION:    ASSESSMENT & PLAN:  This is a very pleasant 60 year old female patient with family history of breast cancer referred to high risk breast cancer clinic for additional recommendations.  We discussed her life time risk of breast cancer and 5 yr risk of breast cancer today  2. We discussed different risk reducing strategies for future breast cancer risk. We discussed chemoprevention and lifestyle modification. We discussed role of ongoing surveillance with mammograms versus MRIs. Patient would be a good candidate for chemoprevention with  tamoxifen or Evista or aromatase inhibitors. We discussed different medications available. We discussed their particular side effects. With tamoxifen side effects would include but not limited to cataracts, increased risk of DVT/PE/cardiovascular events, uterine malignancy, hot flashes, and mood swings. With Evista patient would and could experience mood swings, hot flashes ,aches and pains , and possibly thrombosis. Aromatase inhibitors side effects also include but not limiting to bone loss, aches and pains, hyperlipidemia, hot flashes and mood swings.  Information printed and handed over to the patient. She will think about it.  3. Lifestyle modification: We discussed different interventions exercise at least 5 days a week including both aerobic as well as weight-bearing exercises and strength training. He discussed dietary modification including increasing number of servings of fruit and vegetables as well as decreased in number of servings of meat. We discussed the importance of maintaining a good BMI and limiting alcohol intake.  4. Surveillance: Patient certainly would be a good candidate for ongoing mammograms as well as annual MRIs on a yearly basis. Certainly she could benefit from a 3-D mammogram.  We discussed self breast examination as well as ongoing clinical examination.  We have discussed that long-term toxicity from gadolinium with repeated MRIs is unclear at this time.  MRIs are very sensitive however not as specific hence may sometimes need to additional biopsies.  She expressed understanding of the risks and she is willing to proceed with once a year MRIs for now.  Her OB has been taking care of the annual MRIs.  I would recommend staggering approach so she can get imaging every 6 months.  5. Chemoprevention: As mentioned above, discussed about tamoxifen and arimidex.  6. 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  This does not change any recommendations at this time.  She was informed to stay in touch periodically with the genetic counselor if the lab determines that these variants are indeed significant in the future.  7. Followup: Return to clinic in 2 weeks for telephone visit.   HISTORY OF PRESENTING ILLNESS:   Lisa [name] 60 y.o. female is here because of high risk for breast cancer  This is a very pleasant 60 year old female patient with no significant past medical history except for uncontrolled high blood pressure referred to high risk breast cancer clinic given her family history of breast cancer.  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  She remembers family history of breast cancer in her mom and her sister, both had cancer in their 42s and had second breast cancer.  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 She is here to discuss role of chemoprevention as well as MRI surveillance given her lifetime risk of breast cancer.  She is doing very well today, denies any major complaints.  No concerning review of systems.  She had 3 kids, age at first pregnancy of 39.  Used oral contraceptives for about 20 years.  No hormone replacement therapy.  REVIEW OF SYSTEMS:   Constitutional: Denies fevers,  chills or abnormal night sweats Eyes: Denies blurriness of vision, double vision or watery eyes Ears, nose, mouth, throat, and face: Denies mucositis or sore throat Respiratory: Denies cough, dyspnea or wheezes Cardiovascular: Denies palpitation, chest discomfort or lower extremity swelling Gastrointestinal:  Denies nausea, heartburn or change in bowel habits Skin: Denies abnormal skin rashes Lymphatics: Denies new lymphadenopathy or easy bruising Neurological:Denies numbness, tingling or new weaknesses Behavioral/Psych: Mood is stable, no new changes  All other systems were reviewed with the patient and are negative.  MEDICAL HISTORY:  Past Medical History:  Diagnosis Date   Abnormal Pap smear of cervix    2020 - AGUS pap, negative HR HPV, colposcopy showing LGSIL on biopsy and ECC   Alopecia    Carpal tunnel syndrome of right wrist    Elevated cholesterol    Family history of adverse reaction to anesthesia    sister has naseau   Family history of pancreatic cancer 06/22/2021   HSV infection    Type I and II   Hypertension    Increased risk of breast cancer 2018   27.7 lifetime risk.  Yearly MRI of breast.    Low back pain    OAB (overactive bladder)    Pneumonia    Seizures ([place])     SURGICAL HISTORY: Past Surgical History:  Procedure Laterality Date   ABDOMINAL HYSTERECTOMY  06/23/2004   supracervical hysterectomy.  Ovaries remain.   CESAREAN SECTION     CRYOTHERAPY     ECTOPIC PREGNANCY SURGERY     LUMBAR LAMINECTOMY/DECOMPRESSION MICRODISCECTOMY Left 05/07/2017   Procedure: Decompression L4-L5 and excision of synovial cyst Left;  Surgeon: Latanya Maudlin, MD;  Location: [facility];  Service: Orthopedics;  Laterality: Left;   TRIGGER FINGER RELEASE      SOCIAL HISTORY: Social History   Socioeconomic History   Marital status: Widowed    Spouse name: Not on file   Number of children: Not on file   Years of education: Not on file   Highest education level: Not on file   Occupational History   Not on file  Tobacco Use   Smoking status: Former    Types: Cigarettes    Quit date: 12/31/1996    Years since quitting: 24.6   Smokeless tobacco: Never  Vaping Use   Vaping Use: Never used  Substance and Sexual Activity   Alcohol use: Yes    Alcohol/week: 1.0 - 2.0 standard drink    Types: 1 - 2 Standard drinks or equivalent per week   Drug use: No   Sexual activity: Not Currently    Birth control/protection: Surgical, Post-menopausal    Comment: hysterectomy  Other Topics Concern   Not on file  Social History Narrative   Not on file   Social Determinants of Health   Financial Resource Strain: Not on file  Food Insecurity: Not on file  Transportation Needs: Not on file  Physical Activity: Not on file  Stress: Not on file  Social Connections: Not on file  Intimate Partner Violence: Not on file    FAMILY HISTORY: Family History  Problem Relation Age of Onset   Breast cancer Mother 68       second primary at age 3   Hypertension Mother    Lung cancer Father  40       smoking hx   Diabetes Father    Hypertension Father    Breast cancer Sister 28       recurrence 10 yrs later and bilateral mastectomies   Hypertension Sister    Breast cancer Maternal Aunt        dx before 43, x4   Breast cancer Maternal Aunt        dx after 50   Pancreatic cancer Maternal Aunt    Kidney cancer Maternal Grandmother        dx 45s   Breast cancer Cousin        several female maternal cousins with premenopausal breast cancer    ALLERGIES:  has No Known Allergies.  MEDICATIONS:  Current Outpatient Medications  Medication Sig Dispense Refill   cetirizine (ZYRTEC) 10 MG tablet Take 1 tablet (10 mg total) by mouth daily. 90 tablet 3   Cholecalciferol (VITAMIN D) 50 MCG (2000 UT) CAPS Take 1 capsule (2,000 Units total) by mouth daily. 30 capsule    clobetasol cream (TEMOVATE) 0.05 % Once daily three times a week 30 g 0   Dentifrices (SENSITIVE  TOOTHPASTE/FLUORIDE) 5-0.243 % PSTE Use to brush teeth 2x daily. 300 g 3   finasteride (PROSCAR) 5 MG tablet Take 0.5 tablets (2.5 mg total) by mouth daily. 90 tablet 1   fluticasone (FLONASE) 50 MCG/ACT nasal spray Place 2 sprays into both nostrils daily. 48 g 3   hydrochlorothiazide (HYDRODIURIL) 25 MG tablet Take 1/2 tablet once a day 45 tablet 3   LORazepam (ATIVAN) 0.5 MG tablet TAKE 1 TABLET(0.5 MG) BY MOUTH TWICE DAILY AS NEEDED FOR ANXIETY 30 tablet 1   nystatin powder Apply to affected areas three times a day as needed 60 g 2   olopatadine (PATANOL) 0.1 % ophthalmic solution Place 1 drop into both eyes 2 (two) times daily. 15 mL 3   Omega-3 Fatty Acids (FISH OIL) 1000 MG CAPS Take 1 capsule daily 30 capsule 0   phenytoin (DILANTIN) 100 MG ER capsule Take 2 capsules (200 mg total) by mouth every evening. 180 capsule 3   Sod Fluoride-Potassium Nitrate (PREVIDENT 5000 SENSITIVE) 1.1-5 % PSTE PreviDent 5000 Sensitive 1.1 %-5 % dental paste 100 mL 5   traZODone (DESYREL) 50 MG tablet Take 0.5-1 tablets (25-50 mg total) by mouth at bedtime as needed for sleep. 30 tablet 3   UNABLE TO FIND Nutrafol supplement     valACYclovir (VALTREX) 1000 MG tablet (1/2) tablet daily for suppression- may increase to twice daily x3 days at onset of outbreak 45 tablet 3   vitamin C (ASCORBIC ACID) 500 MG tablet Take 2 tablets (1,000 mg total) by mouth daily.     No current facility-administered medications for this visit.   PHYSICAL EXAMINATION: ECOG PERFORMANCE STATUS: 0 - Asymptomatic  There were no vitals filed for this visit.  There were no vitals filed for this visit.  Physical exam deferred today, had recent breast MRI and in lieu of counseling.  LABORATORY DATA:  I have reviewed the data as listed Lab Results  Component Value Date   WBC 5.1 03/07/2021   HGB 13.5 03/07/2021   HCT 40.1 03/07/2021   MCV 87.0 03/07/2021   PLT 256 03/07/2021     Chemistry      Component Value Date/Time   NA  138 03/07/2021 0959   NA 138 06/03/2012 0000   K 4.2 03/07/2021 0959   CL 103 03/07/2021 0959   CO2 22  03/07/2021 0959   BUN 15 03/07/2021 0959   BUN 13 03/23/2013 0000   CREATININE 0.96 03/07/2021 0959   GLU 89 03/23/2013 0000      Component Value Date/Time   CALCIUM 9.2 03/07/2021 0959   ALKPHOS 95 06/03/2017 0913   AST 19 03/07/2021 0959   ALT 14 03/07/2021 0959   BILITOT 0.3 03/07/2021 0959     I have reviewed pertinent mammogram and MRI results.  Have reviewed pertinent genetic testing results and risk scores based on models calculated.  Lifetime risk of breast cancer per TC model is 21%. 5-year risk of breast cancer per [facility] model is 3.6%.  RADIOGRAPHIC STUDIES: I have personally reviewed the radiological images as listed and agreed with the findings in the report. No results found.  All questions were answered. The patient knows to call the clinic with any problems, questions or concerns. I spent 60 minutes in the care of this patient including H and P, review of records, counseling and coordination of care. We have discussed about chemoprevention, role of MRIs, unknown long-term toxicity of MRIs, increased biopsies with MRI surveillance, different options for chemoprevention, lifestyle interventions.    Benay Pike, MD 08/14/2021 10:08 PM

## 2021-08-15 ENCOUNTER — Telehealth: Payer: Self-pay | Admitting: Hematology and Oncology

## 2021-08-15 ENCOUNTER — Inpatient Hospital Stay: Payer: BC Managed Care – PPO | Admitting: Hematology and Oncology

## 2021-08-15 NOTE — Telephone Encounter (Signed)
R/s appt per 8/16 sch msg. Called pt, no answer. Left msg with appt date and time.  

## 2021-08-22 ENCOUNTER — Telehealth: Payer: Self-pay | Admitting: Hematology and Oncology

## 2021-08-22 ENCOUNTER — Inpatient Hospital Stay: Payer: BC Managed Care – PPO | Admitting: Hematology and Oncology

## 2021-08-22 NOTE — Telephone Encounter (Signed)
Scheduled appt per 8/23 sch msg. Pt aware.  

## 2021-09-07 ENCOUNTER — Ambulatory Visit: Payer: BC Managed Care – PPO | Admitting: Nurse Practitioner

## 2021-09-15 ENCOUNTER — Ambulatory Visit: Payer: BC Managed Care – PPO | Admitting: Nurse Practitioner

## 2021-09-18 ENCOUNTER — Telehealth: Payer: BC Managed Care – PPO | Admitting: Hematology and Oncology

## 2021-09-19 NOTE — Progress Notes (Signed)
[facility] CONSULT NOTE  Patient Care Team: [place], Kawanta F, MD as PCP - General (Family Medicine)  CHIEF COMPLAINTS/PURPOSE OF CONSULTATION:  High risk breast [name] cancer, follo clinicalw up.  ASSESSMENT & PLAN:   This is a very pleasant 60-year-old female patient with family history of breast cancer referred to high risk breast cancer clinic for additional recommendations. During her initial visit we have discussed her 5-year risk of breast cancer and lifetime risk of breast cancer which was 3.6% and 21% respectively.  I recommended considering endocrine prevention.  She wanted to think about it.  She is here for telephone visit to review the discussion again.  She would like to try Avonex.  We once again discussed mechanism of action of Arimidex, adverse effects including but not limited to postmenopausal symptoms, bone loss, muscle aches, body aches, questionable increased risk of cardiovascular events. Prescription was dispensed to the pharmacy of her choice. With regards to screening, she would like to continue annual MRIs and mammograms.  We have previously discussed that the long-term risks of multiple MRIs is unknown at this time and MRIs can lead to increased biopsies.  She is agreeable to proceed.  Last MRI in June without any concerns for breast cancer.  We will repeat her mammogram in March 2023 since her last mammo was March 2022 and MRIs will be done in September of each year in order to do a staggering approach. 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  This does not change any recommendations at this time.  She was informed to stay in touch periodically with the genetic counselor if the lab determines that these variants are indeed significant in the future.  We once again discussed about lifestyle measures including regular exercise, 150 minutes a week, plant-based diet, avoiding alcohol consumption, hormone replacement therapy and self  breast exam. She will return to clinic in 6 months.  She was encouraged to schedule a mammogram at [facility], telephone number was provided.  She was encouraged to call us with any new questions or concerns. Thank you for consulting us in the care of this patient.  Please do not hesitate to contact us with any additional questions or concerns   HISTORY OF PRESENTING ILLNESS:   Lisa [name] 60 y.o. female is here because of high risk for breast cancer  This is a very pleasant 60-year-old female patient with no significant past medical history except for uncontrolled high blood pressure referred to high risk breast cancer clinic given her family history of breast cancer.  She is very healthy, retired, used to work in social services.  She remembers family history of breast cancer in her mom and her sister, both had cancer in their 30s and had second breast cancer.  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 She was initially referred to discuss role of chemoprevention as well as MRI surveillance given her lifetime risk of breast cancer.  She had 3 kids, age at first pregnancy of 27.  Used oral contraceptives for about 20 years.  No hormone replacement therapy.  Interval History  Patient is here for follow-up since her initial visit with high risk breast clinic.  She was given ample time to discuss about chemoprevention.  She would like to try Arimidex for chemoprevention.  She denies any new health complaints.  She was hopeful to start Arimidex in October or at the most November.  She is   willing to do annual MRIs and mammograms.  She recently had an MRI in June which was without any evidence of cancer.  She does not report any other new health problems for me today.   MEDICAL HISTORY:  Past Medical History:  Diagnosis Date   Abnormal Pap smear of cervix    2020 - AGUS pap, negative HR HPV, colposcopy showing LGSIL on biopsy and ECC   Alopecia     Carpal tunnel syndrome of right wrist    Elevated cholesterol    Family history of adverse reaction to anesthesia    sister has naseau   Family history of pancreatic cancer 06/22/2021   HSV infection    Type I and II   Hypertension    Increased risk of breast cancer 2018   27.7 lifetime risk.  Yearly MRI of breast.    Low back pain    OAB (overactive bladder)    Pneumonia    Seizures ([place])     SURGICAL HISTORY: Past Surgical History:  Procedure Laterality Date   ABDOMINAL HYSTERECTOMY  06/23/2004   supracervical hysterectomy.  Ovaries remain.   CESAREAN SECTION     CRYOTHERAPY     ECTOPIC PREGNANCY SURGERY     LUMBAR LAMINECTOMY/DECOMPRESSION MICRODISCECTOMY Left 05/07/2017   Procedure: Decompression L4-L5 and excision of synovial cyst Left;  Surgeon: Latanya Maudlin, MD;  Location: [facility];  Service: Orthopedics;  Laterality: Left;   TRIGGER FINGER RELEASE      SOCIAL HISTORY: Social History   Socioeconomic History   Marital status: Widowed    Spouse name: Not on file   Number of children: Not on file   Years of education: Not on file   Highest education level: Not on file  Occupational History   Not on file  Tobacco Use   Smoking status: Former    Types: Cigarettes    Quit date: 12/31/1996    Years since quitting: 24.7   Smokeless tobacco: Never  Vaping Use   Vaping Use: Never used  Substance and Sexual Activity   Alcohol use: Yes    Alcohol/week: 1.0 - 2.0 standard drink    Types: 1 - 2 Standard drinks or equivalent per week   Drug use: No   Sexual activity: Not Currently    Birth control/protection: Surgical, Post-menopausal    Comment: hysterectomy  Other Topics Concern   Not on file  Social History Narrative   Not on file   Social Determinants of Health   Financial Resource Strain: Not on file  Food Insecurity: Not on file  Transportation Needs: Not on file  Physical Activity: Not on file  Stress: Not on file  Social Connections: Not on file   Intimate Partner Violence: Not on file    FAMILY HISTORY: Family History  Problem Relation Age of Onset   Breast cancer Mother 38       second primary at age 28   Hypertension Mother    Lung cancer Father 59       smoking hx   Diabetes Father    Hypertension Father    Breast cancer Sister 51       recurrence 10 yrs later and bilateral mastectomies   Hypertension Sister    Breast cancer Maternal Aunt        dx before 9, x4   Breast cancer Maternal Aunt        dx after 50   Pancreatic cancer Maternal Aunt    Kidney cancer Maternal Grandmother  dx 60s   Breast cancer Cousin        several female maternal cousins with premenopausal breast cancer    ALLERGIES:  has No Known Allergies.  MEDICATIONS:  Current Outpatient Medications  Medication Sig Dispense Refill   cetirizine (ZYRTEC) 10 MG tablet Take 1 tablet (10 mg total) by mouth daily. 90 tablet 3   Cholecalciferol (VITAMIN D) 50 MCG (2000 UT) CAPS Take 1 capsule (2,000 Units total) by mouth daily. 30 capsule    clobetasol cream (TEMOVATE) 0.05 % Once daily three times a week 30 g 0   Dentifrices (SENSITIVE TOOTHPASTE/FLUORIDE) 5-0.243 % PSTE Use to brush teeth 2x daily. 300 g 3   finasteride (PROSCAR) 5 MG tablet Take 0.5 tablets (2.5 mg total) by mouth daily. 90 tablet 1   fluticasone (FLONASE) 50 MCG/ACT nasal spray Place 2 sprays into both nostrils daily. 48 g 3   hydrochlorothiazide (HYDRODIURIL) 25 MG tablet Take 1/2 tablet once a day 45 tablet 3   LORazepam (ATIVAN) 0.5 MG tablet TAKE 1 TABLET(0.5 MG) BY MOUTH TWICE DAILY AS NEEDED FOR ANXIETY 30 tablet 1   nystatin powder Apply to affected areas three times a day as needed 60 g 2   olopatadine (PATANOL) 0.1 % ophthalmic solution Place 1 drop into both eyes 2 (two) times daily. 15 mL 3   Omega-3 Fatty Acids (FISH OIL) 1000 MG CAPS Take 1 capsule daily 30 capsule 0   phenytoin (DILANTIN) 100 MG ER capsule Take 2 capsules (200 mg total) by mouth every evening.  180 capsule 3   Sod Fluoride-Potassium Nitrate (PREVIDENT 5000 SENSITIVE) 1.1-5 % PSTE PreviDent 5000 Sensitive 1.1 %-5 % dental paste 100 mL 5   traZODone (DESYREL) 50 MG tablet Take 0.5-1 tablets (25-50 mg total) by mouth at bedtime as needed for sleep. 30 tablet 3   UNABLE TO FIND Nutrafol supplement     valACYclovir (VALTREX) 1000 MG tablet (1/2) tablet daily for suppression- may increase to twice daily x3 days at onset of outbreak 45 tablet 3   vitamin C (ASCORBIC ACID) 500 MG tablet Take 2 tablets (1,000 mg total) by mouth daily.     No current facility-administered medications for this visit.   PHYSICAL EXAMINATION: ECOG PERFORMANCE STATUS: 0 - Asymptomatic  There were no vitals filed for this visit.  There were no vitals filed for this visit.  Physical exam deferred today, telephone visit  LABORATORY DATA:  I have reviewed the data as listed Lab Results  Component Value Date   WBC 5.1 03/07/2021   HGB 13.5 03/07/2021   HCT 40.1 03/07/2021   MCV 87.0 03/07/2021   PLT 256 03/07/2021     Chemistry      Component Value Date/Time   NA 138 03/07/2021 0959   NA 138 06/03/2012 0000   K 4.2 03/07/2021 0959   CL 103 03/07/2021 0959   CO2 22 03/07/2021 0959   BUN 15 03/07/2021 0959   BUN 13 03/23/2013 0000   CREATININE 0.96 03/07/2021 0959   GLU 89 03/23/2013 0000      Component Value Date/Time   CALCIUM 9.2 03/07/2021 0959   ALKPHOS 95 06/03/2017 0913   AST 19 03/07/2021 0959   ALT 14 03/07/2021 0959   BILITOT 0.3 03/07/2021 0959     I have reviewed pertinent mammogram and MRI results.  Have reviewed pertinent genetic testing results and risk scores based on models calculated.  Lifetime risk of breast cancer per TC model is 21%. 5-year risk of   breast cancer per [facility] model is 3.6%.  RADIOGRAPHIC STUDIES: I have personally reviewed the radiological images as listed and agreed with the findings in the report. No results found.  All questions were answered. The  patient knows to call the clinic with any problems, questions or concerns. I spent 20 minutes in the care of this patient including H , review of records, counseling and coordination of care. We have discussed about mechanism of action of aromatase inhibitors, adverse effects, other lifestyle measures, role of MRIs and mammograms.  We have discussed about proceeding with annual MRI in March and follow-up.  Self breast exam was recommended and she was encouraged to call us with any new concerns.  I connected with  Ysidra P Godby on 09/20/21 by telephone application and verified that I am speaking with the correct person using two identifiers.   I discussed the limitations of evaluation and management by telemedicine. The patient expressed understanding and agreed to proceed.     Benay Pike, MD 09/19/2021 4:11 PM

## 2021-09-19 NOTE — Progress Notes (Signed)
Subjective:    Patient ID: Lisa [name], female    DOB: December 29, 1961, 60 y.o.   MRN: [id number]  HPI: Lisa [name] is a 60 y.o. female presenting for follow up.  Chief Complaint  Patient presents with   Follow-up    6 month follow up , have some neck pain that start on last Saturday    HYPERTENSION/HYPERLIPIDEMIA Currently taking HCTZ 12.5 mg daily.  BP monitoring frequency: not checking Aspirin: no Recent stressors: no Recurrent headaches: no Visual changes: no Palpitations: no Dyspnea: no Chest pain: no Lower extremity edema: no Dizzy/lightheaded: no The 10-year ASCVD risk score (Arnett DK, et al., 2019) is: 5.9%   Values used to calculate the score:     Age: 73 years     Sex: Female     Is Non-Hispanic African American: Yes     Diabetic: No     Tobacco smoker: No     Systolic Blood Pressure: 355 mmHg     Is BP treated: Yes     HDL Cholesterol: 71 mg/dL     Total Cholesterol: 266 mg/dL   VITAMIN D/B12 DEFICIENCY Duration: chronic Previous Vitamin D level: 26 in past Previous Vitamin B12 level: 400s in past Current supplementation: Vitamin D 2000 IU daily, Vitamin B12 not taking anymore  NECK PAIN Onset: days Location: bilateral  Duration: days Severity: severe Quality: spasm Frequency: intermittent Radiation: none Aggravating factors: movement Alleviating factors: Robaxin Status: better Treatments attempted: heat, Robaxin, massage  INSOMNIA Taking Trazodone about once per month.  This helps significantly.   Duration: chronic Satisfied with sleep quality: no Difficulty falling asleep:  at times Difficulty staying asleep: at times Waking a few hours after sleep onset: yes; wakes up very early History of sleep study: no  ANXIETY/STRESS Takes Ativan about once per month when she is riding in the car with someone else.  This gives her significant anxiety.  Otherwise, she does not have day to day anxiety. Depressed mood: no Depression screen  [name] 2/9 09/20/2021 03/07/2021 01/11/2020 09/18/2019 06/16/2019  Decreased Interest 0 0 0 2 0  Down, Depressed, Hopeless 0 0 0 2 0  PHQ - 2 Score 0 0 0 4 0  Altered sleeping - 0 - 2 -  Tired, decreased energy - 0 - 2 -  Change in appetite - 0 - 2 -  Feeling bad or failure about yourself  - 0 - 2 -  Trouble concentrating - 0 - 2 -  Moving slowly or fidgety/restless - 0 - 0 -  Suicidal thoughts - 0 - 0 -  PHQ-9 Score - 0 - 14 -  Difficult doing work/chores - Not difficult at all - Very difficult -   FATIGUE Duration:  chronic Onset: gradual Context when symptoms started:  unknown Symptoms improve with rest: no  Depressive symptoms: no Stress/anxiety: no Insomnia: yes  Snoring: yes Observed apnea by bed partner: no Daytime hypersomnolence:no Wakes feeling refreshed: no Early morning awakenings: yes History of sleep study: no Dysnea on exertion:  no Orthopnea/PND: no Chest pain: no Chronic cough: no Lower extremity edema: no Arthralgias:yes; hips Myalgias: no Weakness: no Rash: no  No Known Allergies  Outpatient Encounter Medications as of 09/20/2021  Medication Sig   anastrozole (ARIMIDEX) 1 MG tablet Take 1 tablet (1 mg total) by mouth daily.   cetirizine (ZYRTEC) 10 MG tablet Take 1 tablet (10 mg total) by mouth daily.   Cholecalciferol (VITAMIN D) 50 MCG (2000 UT) CAPS Take 1 capsule (  2,000 Units total) by mouth daily.   Dentifrices (SENSITIVE TOOTHPASTE/FLUORIDE) 5-0.243 % PSTE Use to brush teeth 2x daily.   fluticasone (FLONASE) 50 MCG/ACT nasal spray Place 2 sprays into both nostrils daily.   nystatin powder Apply to affected areas three times a day as needed   olopatadine (PATANOL) 0.1 % ophthalmic solution Place 1 drop into both eyes 2 (two) times daily.   Omega-3 Fatty Acids (FISH OIL) 1000 MG CAPS Take 1 capsule daily   Sod Fluoride-Potassium Nitrate (PREVIDENT 5000 SENSITIVE) 1.1-5 % PSTE PreviDent 5000 Sensitive 1.1 %-5 % dental paste   UNABLE TO FIND Nutrafol  supplement   vitamin C (ASCORBIC ACID) 500 MG tablet Take 2 tablets (1,000 mg total) by mouth daily.   [DISCONTINUED] clobetasol cream (TEMOVATE) 0.05 % Once daily three times a week   [DISCONTINUED] finasteride (PROSCAR) 5 MG tablet Take 0.5 tablets (2.5 mg total) by mouth daily.   [DISCONTINUED] hydrochlorothiazide (HYDRODIURIL) 25 MG tablet Take 1/2 tablet once a day   [DISCONTINUED] LORazepam (ATIVAN) 0.5 MG tablet TAKE 1 TABLET(0.5 MG) BY MOUTH TWICE DAILY AS NEEDED FOR ANXIETY   [DISCONTINUED] phenytoin (DILANTIN) 100 MG ER capsule Take 2 capsules (200 mg total) by mouth every evening.   [DISCONTINUED] traZODone (DESYREL) 50 MG tablet Take 0.5-1 tablets (25-50 mg total) by mouth at bedtime as needed for sleep.   [DISCONTINUED] valACYclovir (VALTREX) 1000 MG tablet (1/2) tablet daily for suppression- may increase to twice daily x3 days at onset of outbreak   clobetasol cream (TEMOVATE) 0.05 % Once daily three times a week   finasteride (PROSCAR) 5 MG tablet Take 1 tablet (5 mg total) by mouth daily.   hydrochlorothiazide (HYDRODIURIL) 25 MG tablet Take 1/2 tablet once a day   LORazepam (ATIVAN) 0.5 MG tablet Take 1 tablet (0.5 mg total) by mouth 2 (two) times daily as needed for anxiety. TAKE 1 TABLET(0.5 MG) BY MOUTH TWICE DAILY AS NEEDED FOR ANXIETY   methocarbamol (ROBAXIN) 500 MG tablet Take 1 tablet (500 mg total) by mouth every 6 (six) hours as needed for muscle spasms.   phenytoin (DILANTIN) 100 MG ER capsule Take 2 capsules (200 mg total) by mouth every evening.   scopolamine (TRANSDERM-SCOP, 1.5 MG,) 1 MG/3DAYS Place 1 patch (1.5 mg total) onto the skin every 3 (three) days.   traZODone (DESYREL) 50 MG tablet Take 0.5-1 tablets (25-50 mg total) by mouth at bedtime as needed for sleep.   valACYclovir (VALTREX) 1000 MG tablet (1/2) tablet daily for suppression- may increase to twice daily x3 days at onset of outbreak   No facility-administered encounter medications on file as of  09/20/2021.    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   Neck pain 09/22/2021   Genetic testing 07/12/2021   Family history of pancreatic cancer 06/22/2021   Skin inflammation 11/20/2019   Acquired trigger finger of left middle finger 10/29/2019   Insomnia 09/18/2019   Vitamin D deficiency 06/07/2017   Spinal stenosis, lumbar region with neurogenic claudication 05/07/2017   Allergic rhinitis 11/09/2015   Essential hypertension 01/09/2015   Grief reaction 01/09/2015   Anxiety state 01/09/2015   Adjustment disorder with mixed anxiety and depressed mood 01/09/2015   B12 deficiency 05/10/2013   Hyperlipidemia 09/15/2012   Obese 05/22/2012   Sciatica 05/22/2012   Family history of breast cancer 05/01/2012   Incontinence overflow, stress female 01/21/2012   Seizure disorder ([place]) 01/21/2012    Past Medical History:  Diagnosis Date   Abnormal Pap smear of cervix    2020 - AGUS pap, negative HR HPV, colposcopy showing LGSIL on biopsy and ECC   Alopecia    Carpal tunnel syndrome of right wrist    Elevated cholesterol    Family history of adverse reaction to anesthesia    sister has naseau   Family history of pancreatic cancer 06/22/2021   HSV infection    Type I and II   Hypertension    Increased risk of breast cancer 2018   27.7 lifetime risk.  Yearly MRI of breast.    Low back pain    OAB (overactive bladder)    Pneumonia    Seizures ([facility]CC)     Relevant past medical, surgical, family and social history reviewed and updated as indicated. Interim medical history since our last visit reviewed.  Review of Systems Per HPI unless specifically indicated above     Objective:    BP 124/78 (BP Location: Left Arm, Patient Position: Sitting, Cuff Size: Normal)   Pulse 75   Ht 5\' 2"  (1.575 m)   Wt 169 lb 12.8 oz (77 kg)   LMP 12/31/2004 (Approximate)   SpO2 97%   BMI 31.06 kg/m   Wt Readings from Last 3 Encounters:  09/20/21  169 lb 12.8 oz (77 kg)  07/26/21 173 lb 1.6 oz (78.5 kg)  03/15/21 179 lb (81.2 kg)    Physical Exam Vitals and nursing note reviewed.  Constitutional:      General: She is not in acute distress.    Appearance: Normal appearance. She is not toxic-appearing.  HENT:     Head: Normocephalic and atraumatic.  Eyes:     General: No scleral icterus.       Right eye: No discharge.        Left eye: No discharge.     Extraocular Movements: Extraocular movements intact.     Pupils: Pupils are equal, round, and reactive to light.  Neck:     Vascular: No carotid bruit.      Comments: Neck pain and tenderness to area marked.  No skin changes or mass palpated Cardiovascular:     Rate and Rhythm: Normal rate and regular rhythm.     Heart sounds: Normal heart sounds. No murmur heard. Pulmonary:     Effort: Pulmonary effort is normal. No respiratory distress.     Breath sounds: Normal breath sounds. No wheezing, rhonchi or rales.  Abdominal:     General: Abdomen is flat. Bowel sounds are normal.     Palpations: Abdomen is soft.     Tenderness: There is no abdominal tenderness.  Musculoskeletal:        General: Normal range of motion.     Cervical back: Normal range of motion. Tenderness present.     Right lower leg: No edema.     Left lower leg: No edema.  Skin:    General: Skin is warm and dry.     Capillary Refill: Capillary refill takes less than 2 seconds.     Coloration: Skin is not jaundiced.     Findings: No bruising.  Neurological:     General: No focal deficit present.     Mental Status: She is alert and oriented to person, place, and time.     Motor: No weakness.     Gait: Gait normal.  Psychiatric:        Mood and Affect: Mood normal.        Behavior: Behavior  normal.        Thought Content: Thought content normal.        Judgment: Judgment normal.      Assessment & Plan:   Problem List Items Addressed This Visit       Cardiovascular and Mediastinum   Essential  hypertension    Chronic.  Blood pressure is at goal today in clinic.  Continue hydrochlorothiazide 12.5 milligrams daily.  We will check kidney function with electrolytes when patient returns for fasting blood work.  Follow-up in 6 months.      Relevant Medications   hydrochlorothiazide (HYDRODIURIL) 25 MG tablet   Other Relevant Orders   BASIC METABOLIC PANEL WITH GFR     Nervous and Auditory   Seizure disorder (HCC) - Primary    Chronic.  Follows with seizure specialist at [facility].  We will plan to check total phenytoin level when patient returns for fasting blood work-from review of her medical record, it looks like her total phenytoin has always been on the lower side running between 3.9 and 5.4.  Continue collaboration with seizure specialist.      Relevant Medications   phenytoin (DILANTIN) 100 MG ER capsule   Other Relevant Orders   Phenytoin level, total     Musculoskeletal and Integument   Skin inflammation    Chronic.  Follows closely with dermatologist at [facility] records from care everywhere reviewed.  Plan to continue clobetasol 0.05% daily as needed.  Continue collaboration with dermatology.      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  Follows closely with dermatologist at [place] records from care everywhere reviewed.  Plan to continue clobetasol 0.05% daily as needed and finasteride 5 mg daily.  Continue collaboration with dermatology.      Relevant Medications   finasteride (PROSCAR) 5 MG tablet   clobetasol cream (TEMOVATE) 0.05 %     Other   Vitamin D deficiency    Chronic.  We will recheck vitamin D level when patient returns for fasting blood work and recommend supplementation pending results.      Relevant Orders   VITAMIN D 25 Hydroxy (Vit-D Deficiency, Fractures)   Neck pain    Acute.  Suspect muscle strain.  No red flags, fevers, meningismus. Refill given for Robaxin as this is helped in the past.  Also can try neck  stretches.  Return to clinic if this does not improve, can consider physical therapy.      Relevant Medications   methocarbamol (ROBAXIN) 500 MG tablet   Insomnia    Chronic.  Continue trazodone 50 to 100 mg nightly as needed for sleep.  This works well for her.  She does snore and is interested in having a sleep study-we will arrange.  Follow-up pending results.      Relevant Medications   traZODone (DESYREL) 50 MG tablet   LORazepam (ATIVAN) 0.5 MG tablet   Other Relevant Orders   VITAMIN D 25 Hydroxy (Vit-D Deficiency, Fractures)   Vitamin B12   Ambulatory referral to Sleep Studies   Hyperlipidemia    Chronic.  We will recheck lipids when patient returns for fasting blood work.  It looks like she was on atorvastatin in the past, not sure why discussed that.  Given significantly elevated LDL levels in the past and ketogenic diet, will likely recommend resuming pending cholesterol levels.      Relevant Medications   hydrochlorothiazide (HYDRODIURIL) 25 MG tablet   Other Relevant Orders  Lipid panel   Anxiety state    Chronic.  Situational when patient is a passenger riding in the car.  We will give refill of lorazepam #30.  This should last until next appointment.  Controlled substance agreement signed today.  Follow-up in 3 to 6 months.      Relevant Medications   traZODone (DESYREL) 50 MG tablet   LORazepam (ATIVAN) 0.5 MG tablet   Other Visit Diagnoses     H/O motion sickness       Relevant Medications   scopolamine (TRANSDERM-SCOP, 1.5 MG,) 1 MG/3DAYS   History of herpes genitalis       Relevant Medications   valACYclovir (VALTREX) 1000 MG tablet   History of non anemic vitamin B12 deficiency       Relevant Orders   Vitamin B12   Snoring       We will check sleep study.   Relevant Orders   Ambulatory referral to Sleep Studies   BMI 31.0-31.9,adult       Discussed dietary and lifestyle changes.  Patient is on ketogenic diet.  Goal physical activity is 30 minutes 5  times weekly.   Relevant Orders   Ambulatory referral to Sleep Studies        Follow up plan: Return for pending lab work.

## 2021-09-20 ENCOUNTER — Telehealth (HOSPITAL_BASED_OUTPATIENT_CLINIC_OR_DEPARTMENT_OTHER): Payer: BC Managed Care – PPO | Admitting: Hematology and Oncology

## 2021-09-20 ENCOUNTER — Ambulatory Visit (INDEPENDENT_AMBULATORY_CARE_PROVIDER_SITE_OTHER): Payer: BC Managed Care – PPO | Admitting: Nurse Practitioner

## 2021-09-20 ENCOUNTER — Other Ambulatory Visit: Payer: Self-pay

## 2021-09-20 ENCOUNTER — Encounter: Payer: Self-pay | Admitting: Nurse Practitioner

## 2021-09-20 ENCOUNTER — Encounter: Payer: Self-pay | Admitting: Hematology and Oncology

## 2021-09-20 VITALS — BP 124/78 | HR 75 | Ht 62.0 in | Wt 169.8 lb

## 2021-09-20 DIAGNOSIS — Z79899 Other long term (current) drug therapy: Secondary | ICD-10-CM

## 2021-09-20 DIAGNOSIS — I10 Essential (primary) hypertension: Secondary | ICD-10-CM

## 2021-09-20 DIAGNOSIS — R0683 Snoring: Secondary | ICD-10-CM

## 2021-09-20 DIAGNOSIS — Z6831 Body mass index (BMI) 31.0-31.9, adult: Secondary | ICD-10-CM

## 2021-09-20 DIAGNOSIS — M542 Cervicalgia: Secondary | ICD-10-CM

## 2021-09-20 DIAGNOSIS — Z9189 Other specified personal risk factors, not elsewhere classified: Secondary | ICD-10-CM

## 2021-09-20 DIAGNOSIS — E785 Hyperlipidemia, unspecified: Secondary | ICD-10-CM

## 2021-09-20 DIAGNOSIS — G4700 Insomnia, unspecified: Secondary | ICD-10-CM

## 2021-09-20 DIAGNOSIS — F411 Generalized anxiety disorder: Secondary | ICD-10-CM

## 2021-09-20 DIAGNOSIS — G40909 Epilepsy, unspecified, not intractable, without status epilepticus: Secondary | ICD-10-CM | POA: Diagnosis not present

## 2021-09-20 DIAGNOSIS — E559 Vitamin D deficiency, unspecified: Secondary | ICD-10-CM

## 2021-09-20 DIAGNOSIS — Z8639 Personal history of other endocrine, nutritional and metabolic disease: Secondary | ICD-10-CM

## 2021-09-20 DIAGNOSIS — Z87898 Personal history of other specified conditions: Secondary | ICD-10-CM

## 2021-09-20 DIAGNOSIS — L089 Local infection of the skin and subcutaneous tissue, unspecified: Secondary | ICD-10-CM

## 2021-09-20 DIAGNOSIS — Z8619 Personal history of other infectious and parasitic diseases: Secondary | ICD-10-CM

## 2021-09-20 DIAGNOSIS — L658 Other specified nonscarring hair loss: Secondary | ICD-10-CM

## 2021-09-20 MED ORDER — ANASTROZOLE 1 MG PO TABS: 1.0000 mg | ORAL_TABLET | Freq: Every day | ORAL | 3 refills | Status: DC

## 2021-09-20 MED ORDER — HYDROCHLOROTHIAZIDE 25 MG PO TABS: ORAL_TABLET | ORAL | 3 refills | Status: DC

## 2021-09-20 MED ORDER — TRAZODONE HCL 50 MG PO TABS: 25.0000 mg | ORAL_TABLET | Freq: Every evening | ORAL | 3 refills | Status: AC | PRN

## 2021-09-20 MED ORDER — LORAZEPAM 0.5 MG PO TABS: 0.5000 mg | ORAL_TABLET | Freq: Two times a day (BID) | ORAL | 0 refills | Status: AC | PRN

## 2021-09-20 MED ORDER — VALACYCLOVIR HCL 1 G PO TABS: ORAL_TABLET | ORAL | 3 refills | Status: DC

## 2021-09-20 MED ORDER — FINASTERIDE 5 MG PO TABS: 5.0000 mg | ORAL_TABLET | Freq: Every day | ORAL | 1 refills | Status: DC

## 2021-09-20 MED ORDER — METHOCARBAMOL 500 MG PO TABS: 500.0000 mg | ORAL_TABLET | Freq: Four times a day (QID) | ORAL | 0 refills | Status: AC | PRN

## 2021-09-20 MED ORDER — PHENYTOIN SODIUM EXTENDED 100 MG PO CAPS: 200.0000 mg | ORAL_CAPSULE | Freq: Every evening | ORAL | 3 refills | Status: DC

## 2021-09-20 MED ORDER — SCOPOLAMINE 1 MG/3DAYS TD PT72: 1.0000 | MEDICATED_PATCH | TRANSDERMAL | 1 refills | Status: DC

## 2021-09-20 MED ORDER — CLOBETASOL PROPIONATE 0.05 % EX CREA: TOPICAL_CREAM | CUTANEOUS | 1 refills | Status: AC

## 2021-09-22 ENCOUNTER — Other Ambulatory Visit: Payer: Self-pay | Admitting: Nurse Practitioner

## 2021-09-22 DIAGNOSIS — E785 Hyperlipidemia, unspecified: Secondary | ICD-10-CM

## 2021-09-22 DIAGNOSIS — G40909 Epilepsy, unspecified, not intractable, without status epilepticus: Secondary | ICD-10-CM

## 2021-09-22 DIAGNOSIS — M542 Cervicalgia: Secondary | ICD-10-CM | POA: Insufficient documentation

## 2021-09-22 DIAGNOSIS — E559 Vitamin D deficiency, unspecified: Secondary | ICD-10-CM

## 2021-09-22 DIAGNOSIS — I10 Essential (primary) hypertension: Secondary | ICD-10-CM

## 2021-09-22 DIAGNOSIS — Z79899 Other long term (current) drug therapy: Secondary | ICD-10-CM | POA: Insufficient documentation

## 2021-09-22 DIAGNOSIS — L658 Other specified nonscarring hair loss: Secondary | ICD-10-CM | POA: Insufficient documentation

## 2021-09-22 DIAGNOSIS — Z8639 Personal history of other endocrine, nutritional and metabolic disease: Secondary | ICD-10-CM

## 2021-09-22 NOTE — Assessment & Plan Note (Signed)
Lorazepam 0.5 mg #30 no refills

## 2021-09-22 NOTE — Assessment & Plan Note (Signed)
Chronic.  Continue trazodone 50 to 100 mg nightly as needed for sleep.  This works well for her.  She does snore and is interested in having a sleep study-we will arrange.  Follow-up pending results.

## 2021-09-22 NOTE — Assessment & Plan Note (Signed)
Chronic.  We will recheck vitamin D level when patient returns for fasting blood work and recommend supplementation pending results.

## 2021-09-22 NOTE — Assessment & Plan Note (Signed)
Chronic.  Follows with seizure specialist at [facility].  We will plan to check total phenytoin level when patient returns for fasting blood work-from review of her medical record, it looks like her total phenytoin has always been on the lower side running between 3.9 and 5.4.  Continue collaboration with seizure specialist.

## 2021-09-22 NOTE — Assessment & Plan Note (Signed)
Chronic.  Blood pressure is at goal today in clinic.  Continue hydrochlorothiazide 12.5 milligrams daily.  We will check kidney function with electrolytes when patient returns for fasting blood work.  Follow-up in 6 months.

## 2021-09-22 NOTE — Assessment & Plan Note (Signed)
Chronic.  We will recheck lipids when patient returns for fasting blood work.  It looks like she was on atorvastatin in the past, not sure why discussed that.  Given significantly elevated LDL levels in the past and ketogenic diet, will likely recommend resuming pending cholesterol levels.

## 2021-09-22 NOTE — Assessment & Plan Note (Signed)
Chronic.  Follows closely with dermatologist at [facility] records from care everywhere reviewed.  Plan to continue clobetasol 0.05% daily as needed and finasteride 5 mg daily.  Continue collaboration with dermatology.

## 2021-09-22 NOTE — Assessment & Plan Note (Signed)
Chronic.  Follows closely with dermatologist at [facility] records from care everywhere reviewed.  Plan to continue clobetasol 0.05% daily as needed.  Continue collaboration with dermatology.

## 2021-09-22 NOTE — Assessment & Plan Note (Signed)
Chronic.  Situational when patient is a passenger riding in the car.  We will give refill of lorazepam #30.  This should last until next appointment.  Controlled substance agreement signed today.  Follow-up in 3 to 6 months.

## 2021-09-22 NOTE — Assessment & Plan Note (Addendum)
Acute.  Suspect muscle strain.  No red flags, fevers, meningismus. Refill given for Robaxin as this is helped in the past.  Also can try neck stretches.  Return to clinic if this does not improve, can consider physical therapy.

## 2021-10-05 ENCOUNTER — Encounter: Payer: Self-pay | Admitting: Genetic Counselor

## 2021-12-12 NOTE — Progress Notes (Signed)
GYNECOLOGY  VISIT   HPI: 60 y.o.   Widowed  [place] American  female   [phone] with Patient's last menstrual period was 12/31/2004 (approximate).   here for vaginal bleeding after intercourse. First intercourse in 5 years. Patient also complaining of some vaginal irritation.   Some odor.   She is not sure if the problem is her urine.   No pain outside of intercourse.  No lubricant use with sex.   Does [place] coconut oil every day for lubrication.  Continue to have bleeding but less so with intercourse.  No condom use.   Gave a urine specimen today.  No dysuria.  No urgency or frequency  She agrees to STD screening.   Started Anastrozole for breast cancer prevention.  She is not sure if she will continue.   GYNECOLOGIC HISTORY: Patient's last menstrual period was 12/31/2004 (approximate). 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   Neck pain 09/22/2021   Controlled substance agreement signed 09/22/2021   Genetic testing 07/12/2021   Family history of pancreatic cancer 06/22/2021   Skin inflammation 11/20/2019   Acquired trigger finger of left middle finger 10/29/2019   Insomnia 09/18/2019   Vitamin D deficiency 06/07/2017   Spinal stenosis, lumbar region with neurogenic claudication 05/07/2017   Allergic rhinitis 11/09/2015   Essential hypertension 01/09/2015   Grief reaction 01/09/2015   Anxiety state 01/09/2015   Adjustment disorder with mixed anxiety and depressed mood 01/09/2015   B12 deficiency 05/10/2013   Hyperlipidemia 09/15/2012   Obese  05/22/2012   Sciatica 05/22/2012   Family history of breast cancer 05/01/2012   Incontinence overflow, stress female 01/21/2012   Seizure disorder ([place]) 01/21/2012    Past Medical History:  Diagnosis Date   Abnormal Pap smear of cervix    2020 - AGUS pap, negative HR HPV, colposcopy showing LGSIL on biopsy and ECC   Alopecia    Carpal tunnel syndrome of right wrist    Elevated cholesterol    Family history of adverse reaction to anesthesia    sister has naseau   Family history of pancreatic cancer 06/22/2021   HSV infection    Type I and II   Hypertension    Increased risk of breast cancer 2018   27.7 lifetime risk.  Yearly MRI of breast.    Low back pain    OAB (overactive bladder)    Pneumonia    Seizures ([place])     Past Surgical History:  Procedure Laterality Date   ABDOMINAL HYSTERECTOMY  06/23/2004   supracervical hysterectomy.  Ovaries remain.   CESAREAN SECTION     CRYOTHERAPY     ECTOPIC PREGNANCY SURGERY     LUMBAR LAMINECTOMY/DECOMPRESSION MICRODISCECTOMY Left 05/07/2017   Procedure: Decompression L4-L5 and excision of synovial cyst  Left;  Surgeon: Latanya Maudlin, MD;  Location: [facility];  Service: Orthopedics;  Laterality: Left;   TRIGGER FINGER RELEASE      Current Outpatient Medications  Medication Sig Dispense Refill   anastrozole (ARIMIDEX) 1 MG tablet Take 1 tablet (1 mg total) by mouth daily. 30 tablet 3   cetirizine (ZYRTEC) 10 MG tablet Take 1 tablet (10 mg total) by mouth daily. 90 tablet 3   Cholecalciferol (VITAMIN D) 50 MCG (2000 UT) CAPS Take 1 capsule (2,000 Units total) by mouth daily. 30 capsule    clobetasol cream (TEMOVATE) 0.05 % Once daily three times a week 30 g 1   Dentifrices (SENSITIVE TOOTHPASTE/FLUORIDE) 5-0.243 % PSTE Use to brush teeth 2x daily. 300 g 3   finasteride (PROSCAR) 5 MG tablet Take 1 tablet (5 mg total) by mouth daily. 90 tablet 1   hydrochlorothiazide (HYDRODIURIL) 25 MG tablet Take 1/2 tablet once a day 45 tablet 3    LORazepam (ATIVAN) 0.5 MG tablet Take 1 tablet (0.5 mg total) by mouth 2 (two) times daily as needed for anxiety. TAKE 1 TABLET(0.5 MG) BY MOUTH TWICE DAILY AS NEEDED FOR ANXIETY 30 tablet 0   methocarbamol (ROBAXIN) 500 MG tablet Take 1 tablet (500 mg total) by mouth every 6 (six) hours as needed for muscle spasms. 180 tablet 0   nystatin powder Apply to affected areas three times a day as needed 60 g 2   olopatadine (PATANOL) 0.1 % ophthalmic solution Place 1 drop into both eyes 2 (two) times daily. 15 mL 3   Omega-3 Fatty Acids (FISH OIL) 1000 MG CAPS Take 1 capsule daily 30 capsule 0   phenytoin (DILANTIN) 100 MG ER capsule Take 2 capsules (200 mg total) by mouth every evening. 180 capsule 3   scopolamine (TRANSDERM-SCOP, 1.5 MG,) 1 MG/3DAYS Place 1 patch (1.5 mg total) onto the skin every 3 (three) days. 10 patch 1   Sod Fluoride-Potassium Nitrate (PREVIDENT 5000 SENSITIVE) 1.1-5 % PSTE PreviDent 5000 Sensitive 1.1 %-5 % dental paste 100 mL 5   traZODone (DESYREL) 50 MG tablet Take 0.5-1 tablets (25-50 mg total) by mouth at bedtime as needed for sleep. 30 tablet 3   valACYclovir (VALTREX) 1000 MG tablet (1/2) tablet daily for suppression- may increase to twice daily x3 days at onset of outbreak 45 tablet 3   vitamin C (ASCORBIC ACID) 500 MG tablet Take 2 tablets (1,000 mg total) by mouth daily.     No current facility-administered medications for this visit.     ALLERGIES: Patient has no known allergies.  Family History  Problem Relation Age of Onset   Breast cancer Mother 15       second primary at age 109   Hypertension Mother    Lung cancer Father 46       smoking hx   Diabetes Father    Hypertension Father    Breast cancer Sister 85       recurrence 10 yrs later and bilateral mastectomies   Hypertension Sister    Breast cancer Maternal Aunt        dx before 7, x4   Breast cancer Maternal Aunt        dx after 50   Pancreatic cancer Maternal Aunt    Kidney cancer Maternal  Grandmother        dx 80s   Breast cancer Cousin        several female maternal cousins with premenopausal breast cancer    Social History  Socioeconomic History   Marital status: Widowed    Spouse name: Not on file   Number of children: Not on file   Years of education: Not on file   Highest education level: Not on file  Occupational History   Not on file  Tobacco Use   Smoking status: Former    Types: Cigarettes    Quit date: 12/31/1996    Years since quitting: 24.9   Smokeless tobacco: Never  Vaping Use   Vaping Use: Never used  Substance and Sexual Activity   Alcohol use: Yes    Alcohol/week: 1.0 - 2.0 standard drink    Types: 1 - 2 Standard drinks or equivalent per week   Drug use: No   Sexual activity: Not Currently    Birth control/protection: Surgical, Post-menopausal    Comment: hysterectomy  Other Topics Concern   Not on file  Social History Narrative   Not on file   Social Determinants of Health   Financial Resource Strain: Not on file  Food Insecurity: Not on file  Transportation Needs: Not on file  Physical Activity: Not on file  Stress: Not on file  Social Connections: Not on file  Intimate Partner Violence: Not on file    Review of Systems  Genitourinary:  Positive for vaginal bleeding (and irritation).  All other systems reviewed and are negative.  PHYSICAL EXAMINATION:    BP 120/60    Pulse 67    Ht _0  (1.549 m)    Wt 179 lb (81.2 kg)    LMP 12/31/2004 (Approximate)    SpO2 98%    BMI 33.82 kg/m     General appearance: alert, cooperative and appears stated age  Pelvic: External genitalia:  no lesions              Urethra:  normal appearing urethra with no masses, tenderness or lesions              Bartholins and Skenes: normal                 Vagina: normal appearing vagina with normal color and discharge, no lesions.  Mild atrophy noted.               Cervix: no lesions                Bimanual Exam:  Uterus:  normal size, contour,  position, consistency, mobility, non-tender              Adnexa: no mass, fullness, tenderness              Chaperone was present for exam:  Estill Bamberg, CMA  ASSESSMENT  Postcoital bleeding.  Vaginal atrophy.  Vaginitis.  STD screening and screening for infectious disease.   Status post supracervical hysterectomy.  Ovaries remain.  FH breast cancer.  At increased risk of breast cancer.  21% lifetime risk.  On Arimidex.  Uncertain results in ATM and MSH6 genetic testing, the latter being likely benign.   PLAN  We discussed atrophy and tx options - water based lubricants, cooking oils, vit E vaginal suppositories, and local vaginal estrogen treatment.  Will reach out to her oncologist to determine if she can use Vagifem treatment.  Vaginitis and cervicitis testing. Yeast, BV, trich, GC/CT.  Will check urine and do reflex culture.  Serum testing for HIV, syphilis, hep C.  We discussed condom use.    An After Visit Summary was printed and given to the patient.  32 min  total time was spent for this patient encounter, including preparation, face-to-face counseling with the patient, coordination of care, and documentation of the encounter.

## 2021-12-13 ENCOUNTER — Other Ambulatory Visit: Payer: Self-pay

## 2021-12-13 ENCOUNTER — Encounter: Payer: Self-pay | Admitting: Obstetrics and Gynecology

## 2021-12-13 ENCOUNTER — Other Ambulatory Visit (HOSPITAL_COMMUNITY)
Admission: RE | Admit: 2021-12-13 | Discharge: 2021-12-13 | Disposition: A | Discharge status: Home / Self Care | Payer: BC Managed Care – PPO | Source: Ambulatory Visit | Attending: Obstetrics and Gynecology | Admitting: Obstetrics and Gynecology

## 2021-12-13 ENCOUNTER — Ambulatory Visit (INDEPENDENT_AMBULATORY_CARE_PROVIDER_SITE_OTHER): Payer: BC Managed Care – PPO | Admitting: Obstetrics and Gynecology

## 2021-12-13 ENCOUNTER — Telehealth: Payer: Self-pay | Admitting: Obstetrics and Gynecology

## 2021-12-13 VITALS — BP 120/60 | HR 67 | Ht 61.0 in | Wt 179.0 lb

## 2021-12-13 DIAGNOSIS — N760 Acute vaginitis: Secondary | ICD-10-CM | POA: Diagnosis present

## 2021-12-13 DIAGNOSIS — N930 Postcoital and contact bleeding: Secondary | ICD-10-CM

## 2021-12-13 DIAGNOSIS — Z113 Encounter for screening for infections with a predominantly sexual mode of transmission: Secondary | ICD-10-CM | POA: Insufficient documentation

## 2021-12-13 DIAGNOSIS — Z119 Encounter for screening for infectious and parasitic diseases, unspecified: Secondary | ICD-10-CM | POA: Diagnosis not present

## 2021-12-13 DIAGNOSIS — N952 Postmenopausal atrophic vaginitis: Secondary | ICD-10-CM | POA: Diagnosis not present

## 2021-12-13 DIAGNOSIS — N898 Other specified noninflammatory disorders of vagina: Secondary | ICD-10-CM

## 2021-12-13 LAB — URINALYSIS W MICROSCOPIC + REFLEX CULTURE
Bacteria, UA: NONE SEEN /HPF
Bilirubin Urine: NEGATIVE
Glucose, UA: NEGATIVE
Hyaline Cast: NONE SEEN /LPF
Ketones, ur: NEGATIVE
Leukocyte Esterase: NEGATIVE
Nitrites, Initial: NEGATIVE
Protein, ur: NEGATIVE
Specific Gravity, Urine: 1.015 (ref 1.001–1.035)
WBC, UA: NONE SEEN /HPF (ref 0–5)
pH: 6 (ref 5.0–8.0)

## 2021-12-13 LAB — NO CULTURE INDICATED

## 2021-12-13 NOTE — Telephone Encounter (Signed)
Please reach out to patient's oncologist or covering oncologist to see if patient may start Vagifem 10 mcg dosage for vaginal atrophy.    She is at increased risk for breat cancer and is taking Arimidex.

## 2021-12-14 LAB — CERVICOVAGINAL ANCILLARY ONLY
Bacterial Vaginitis (gardnerella): NEGATIVE
Candida Glabrata: NEGATIVE
Candida Vaginitis: NEGATIVE
Chlamydia: NEGATIVE
Comment: NEGATIVE
Comment: NEGATIVE
Comment: NEGATIVE
Comment: NEGATIVE
Comment: NEGATIVE
Comment: NORMAL
Neisseria Gonorrhea: NEGATIVE
Trichomonas: NEGATIVE

## 2021-12-14 LAB — HEPATITIS C ANTIBODY
Hepatitis C Ab: NONREACTIVE
SIGNAL TO CUT-OFF: 0.05 (ref <1.00)

## 2021-12-14 LAB — RPR: RPR Ser Ql: NONREACTIVE

## 2021-12-14 LAB — HIV ANTIBODY (ROUTINE TESTING W REFLEX): HIV 1&2 Ab, 4th Generation: NONREACTIVE

## 2021-12-14 NOTE — Telephone Encounter (Signed)
Dr.Praveena response " We dont recommend systemic estrogen, some patients use topical estrogen at the lowest dose to alleviate her symptoms which might be safer because of minimal systemic absorption."

## 2021-12-14 NOTE — Telephone Encounter (Signed)
Message sent to oncologist. Will wait for response.

## 2021-12-15 MED ORDER — ESTRADIOL 10 MCG VA TABS: ORAL_TABLET | VAGINAL | 0 refills | Status: DC

## 2021-12-15 NOTE — Telephone Encounter (Signed)
Patient aware, Rx sent. Message sent to appointments to schedule.

## 2021-12-15 NOTE — Telephone Encounter (Signed)
Please relay the message from her oncologist to her.   Ok for Vagifem 10 mcg pv at hs x 2 weeks and then  10 mcg at hs twice weekly.  Disp:  34 Refills:  zero.   Patient will need her annual exam in 3 months.  Please schedule this.

## 2021-12-18 NOTE — Telephone Encounter (Signed)
Patient scheduled on 03/22/22

## 2021-12-19 ENCOUNTER — Ambulatory Visit: Payer: BC Managed Care – PPO | Admitting: Obstetrics and Gynecology

## 2022-02-10 ENCOUNTER — Encounter: Payer: Self-pay | Admitting: Obstetrics and Gynecology

## 2022-02-12 ENCOUNTER — Other Ambulatory Visit: Payer: Self-pay | Admitting: Obstetrics and Gynecology

## 2022-02-12 DIAGNOSIS — Z8619 Personal history of other infectious and parasitic diseases: Secondary | ICD-10-CM

## 2022-02-12 MED ORDER — VALACYCLOVIR HCL 1 G PO TABS: ORAL_TABLET | ORAL | 0 refills | Status: DC

## 2022-02-12 NOTE — Telephone Encounter (Signed)
Last annual exam was on 03/15/22 Annual exam scheduled on 03/22/22

## 2022-02-12 NOTE — Progress Notes (Signed)
Rx for Valtrex.  

## 2022-02-13 NOTE — Telephone Encounter (Signed)
Dr.Silva do you have any recommendations for female provider.

## 2022-02-13 NOTE — Telephone Encounter (Signed)
Please share the QR code information with the patient regarding primary care providers in the [place].   She may want to select someone close to her home or work.

## 2022-03-09 ENCOUNTER — Ambulatory Visit: Payer: BC Managed Care – PPO

## 2022-03-12 ENCOUNTER — Other Ambulatory Visit: Payer: Self-pay | Admitting: Obstetrics and Gynecology

## 2022-03-12 MED ORDER — ESTRADIOL 10 MCG VA TABS: ORAL_TABLET | VAGINAL | 0 refills | Status: DC

## 2022-03-12 NOTE — Telephone Encounter (Signed)
Last AEX 03/15/21--scheduled 03/22/22. ?Mammo scheduled for 03/13/22.  ? ? ?

## 2022-03-13 ENCOUNTER — Ambulatory Visit
Admission: RE | Admit: 2022-03-13 | Discharge: 2022-03-13 | Disposition: A | Discharge status: Home / Self Care | Payer: BC Managed Care – PPO | Source: Ambulatory Visit | Attending: Hematology and Oncology | Admitting: Hematology and Oncology

## 2022-03-13 DIAGNOSIS — Z9189 Other specified personal risk factors, not elsewhere classified: Secondary | ICD-10-CM

## 2022-03-14 ENCOUNTER — Ambulatory Visit: Payer: BC Managed Care – PPO

## 2022-03-15 ENCOUNTER — Encounter: Payer: Self-pay | Admitting: Hematology and Oncology

## 2022-03-16 ENCOUNTER — Other Ambulatory Visit: Payer: Self-pay | Admitting: *Deleted

## 2022-03-16 MED ORDER — ANASTROZOLE 1 MG PO TABS: 1.0000 mg | ORAL_TABLET | Freq: Every day | ORAL | 3 refills | Status: DC

## 2022-03-21 ENCOUNTER — Ambulatory Visit: Payer: BC Managed Care – PPO | Admitting: Nurse Practitioner

## 2022-03-22 ENCOUNTER — Ambulatory Visit: Payer: BC Managed Care – PPO | Admitting: Obstetrics and Gynecology

## 2022-04-03 ENCOUNTER — Ambulatory Visit (INDEPENDENT_AMBULATORY_CARE_PROVIDER_SITE_OTHER): Payer: BC Managed Care – PPO | Admitting: Obstetrics and Gynecology

## 2022-04-03 ENCOUNTER — Other Ambulatory Visit (HOSPITAL_COMMUNITY)
Admission: RE | Admit: 2022-04-03 | Discharge: 2022-04-03 | Disposition: A | Discharge status: Home / Self Care | Payer: BC Managed Care – PPO | Source: Ambulatory Visit | Attending: Obstetrics and Gynecology | Admitting: Obstetrics and Gynecology

## 2022-04-03 ENCOUNTER — Encounter: Payer: Self-pay | Admitting: Obstetrics and Gynecology

## 2022-04-03 ENCOUNTER — Encounter: Payer: Self-pay | Admitting: Hematology and Oncology

## 2022-04-03 VITALS — BP 120/80 | Resp 14 | Ht 61.0 in | Wt 174.0 lb

## 2022-04-03 DIAGNOSIS — Z8619 Personal history of other infectious and parasitic diseases: Secondary | ICD-10-CM

## 2022-04-03 DIAGNOSIS — Z124 Encounter for screening for malignant neoplasm of cervix: Secondary | ICD-10-CM

## 2022-04-03 DIAGNOSIS — Z87410 Personal history of cervical dysplasia: Secondary | ICD-10-CM

## 2022-04-03 DIAGNOSIS — Z01419 Encounter for gynecological examination (general) (routine) without abnormal findings: Secondary | ICD-10-CM | POA: Diagnosis not present

## 2022-04-03 MED ORDER — ESTRADIOL 10 MCG VA TABS: ORAL_TABLET | VAGINAL | 3 refills | Status: DC

## 2022-04-03 MED ORDER — VALACYCLOVIR HCL 1 G PO TABS: ORAL_TABLET | ORAL | 3 refills | Status: DC

## 2022-04-03 MED ORDER — NYSTATIN 100000 UNIT/GM EX POWD: CUTANEOUS | 2 refills | Status: AC

## 2022-04-03 NOTE — Progress Notes (Signed)
61 y.o. G16P0013 Widowed [place] American female here for annual exam.   ? ?Ran out of Vagifem. ?It is working well. ? ?Wants a pap today.  ? ?Needs refill of Valtrex and Nystatin powder.  ? ?See oncology for increased risk of breast cancer.  ? ?PCP:   Dr. Buelah Manis ? ?Patient's last menstrual period was 12/31/2004 (approximate).     ?  ?    ?Sexually active: Yes.    ?The current method of family planning is condoms most of the time and status post hysterectomy.    ?Exercising: No.   ?Smoker:  no ? ?Health Maintenance: ?Pap:  03/18/2021 - normal, negative HR HPV, 03/14/20 - normal, neg HR HPV, 02/17/19 - AGUS, neg HR HPV.  Colposcopy ECC and biopsy both LGSIL.  ?History of abnormal Pap:  yes, cryotherapy to cervix.  ?MMG:  03/13/2022 - BI-RADS1, cat b density. ?Colonoscopy:  03/09/2016 - normal.  ?BMD:     Result  n/a ?TDaP:  12/13/2017 ?Gardasil:   no ?HIV:  NR ?Hep C: NR ?Screening Labs:  PCP ? ? reports that she quit smoking about 25 years ago. Her smoking use included cigarettes. She has never used smokeless tobacco. She reports current alcohol use of about 1.0 - 2.0 standard drink per week. She reports that she does not use drugs. ? ?Past Medical History:  ?Diagnosis Date  ? Abnormal Pap smear of cervix   ? 2020 - AGUS pap, negative HR HPV, colposcopy showing LGSIL on biopsy and ECC  ? Alopecia   ? Carpal tunnel syndrome of right wrist   ? Elevated cholesterol   ? Family history of adverse reaction to anesthesia   ? sister has naseau  ? Family history of pancreatic cancer 06/22/2021  ? 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 Hypertension   ? Increased risk of breast cancer 2018  ? 27.7 lifetime risk.  Yearly MRI of breast.   ? Low back pain   ? OAB (overactive bladder)   ? Pneumonia   ? Seizures ([place])   ? ? ?Past Surgical History:  ?Procedure Laterality Date  ? ABDOMINAL HYSTERECTOMY  06/23/2004  ? supracervical hysterectomy.  Ovaries remain.  ? CESAREAN SECTION    ? CRYOTHERAPY    ? ECTOPIC PREGNANCY SURGERY    ? LUMBAR  LAMINECTOMY/DECOMPRESSION MICRODISCECTOMY Left 05/07/2017  ? Procedure: Decompression L4-L5 and excision of synovial cyst Left;  Surgeon: Latanya Maudlin, MD;  Location: [facility];  Service: Orthopedics;  Laterality: Left;  ? TRIGGER FINGER RELEASE    ? ? ?Current Outpatient Medications  ?Medication Sig Dispense Refill  ? anastrozole (ARIMIDEX) 1 MG tablet Take 1 tablet (1 mg total) by mouth daily. 30 tablet 3  ? cetirizine (ZYRTEC) 10 MG tablet Take 1 tablet (10 mg total) by mouth daily. 90 tablet 3  ? Cholecalciferol (VITAMIN D) 50 MCG (2000 UT) CAPS Take 1 capsule (2,000 Units total) by mouth daily. 30 capsule   ? clobetasol cream (TEMOVATE) 0.05 % Once daily three times a week 30 g 1  ? Dentifrices (SENSITIVE TOOTHPASTE/FLUORIDE) 5-0.243 % PSTE Use to brush teeth 2x daily. 300 g 3  ? Estradiol 10 MCG TABS vaginal tablet Insert 1 tablet (10 mcg) vaginally twice a week at night. 8 tablet 0  ? finasteride (PROSCAR) 5 MG tablet Take 1 tablet (5 mg total) by mouth daily. 90 tablet 1  ? hydrochlorothiazide (HYDRODIURIL) 25 MG tablet Take 1/2 tablet once a day 45 tablet 3  ? LORazepam (  ATIVAN) 0.5 MG tablet Take 1 tablet (0.5 mg total) by mouth 2 (two) times daily as needed for anxiety. TAKE 1 TABLET(0.5 MG) BY MOUTH TWICE DAILY AS NEEDED FOR ANXIETY 30 tablet 0  ? methocarbamol (ROBAXIN) 500 MG tablet Take 1 tablet (500 mg total) by mouth every 6 (six) hours as needed for muscle spasms. 180 tablet 0  ? nystatin powder Apply to affected areas three times a day as needed 60 g 2  ? olopatadine (PATANOL) 0.1 % ophthalmic solution Place 1 drop into both eyes 2 (two) times daily. 15 mL 3  ? Omega-3 Fatty Acids (FISH OIL) 1000 MG CAPS Take 1 capsule daily 30 capsule 0  ? phenytoin (DILANTIN) 100 MG ER capsule Take 2 capsules (200 mg total) by mouth every evening. 180 capsule 3  ? scopolamine (TRANSDERM-SCOP, 1.5 MG,) 1 MG/3DAYS Place 1 patch (1.5 mg total) onto the skin every 3 (three) days. 10 patch 1  ? Sod Fluoride-Potassium  Nitrate (PREVIDENT 5000 SENSITIVE) 1.1-5 % PSTE PreviDent 5000 Sensitive 1.1 %-5 % dental paste 100 mL 5  ? traZODone (DESYREL) 50 MG tablet Take 0.5-1 tablets (25-50 mg total) by mouth at bedtime as needed for sleep. 30 tablet 3  ? valACYclovir (VALTREX) 1000 MG tablet (1/2) tablet daily for suppression- may increase to twice daily x3 days at onset of outbreak 45 tablet 0  ? vitamin C (ASCORBIC ACID) 500 MG tablet Take 2 tablets (1,000 mg total) by mouth daily.    ? ?No current facility-administered medications for this visit.  ? ? ?Family History  ?Problem Relation Age of Onset  ? Breast cancer Mother 82  ?     second primary at age 64  ? Hypertension Mother   ? Lung cancer Father 94  ?     smoking hx  ? Diabetes Father   ? Hypertension Father   ? Breast cancer Sister 51  ?     recurrence 10 yrs later and bilateral mastectomies  ? Hypertension Sister   ? Breast cancer Maternal Aunt   ?     dx before 50, x4  ? Breast cancer Maternal Aunt   ?     dx after 50  ? Pancreatic cancer Maternal Aunt   ? Kidney cancer Maternal Grandmother   ?     dx 53s  ? Breast cancer Cousin   ?     several female maternal cousins with premenopausal breast cancer  ? ? ?Review of Systems  ?All other systems reviewed and are negative. ? ?Exam:   ?Resp 14   Ht '5\' 1"'$  (1.549 m)   Wt 174 lb (78.9 kg)   LMP 12/31/2004 (Approximate)   BMI 32.88 kg/m?     ?General appearance: alert, cooperative and appears stated age ?Head: normocephalic, without obvious abnormality, atraumatic ?Neck: no adenopathy, supple, symmetrical, trachea midline and thyroid normal to inspection and palpation ?Lungs: clear to auscultation bilaterally ?Breasts: normal appearance, no masses or tenderness, No nipple retraction or dimpling, No nipple discharge or bleeding, No axillary adenopathy ?Heart: regular rate and rhythm ?Abdomen: soft, non-tender; no masses, no organomegaly ?Extremities: extremities normal, atraumatic, no cyanosis or edema ?Skin: skin color, texture,  turgor normal. No rashes or lesions ?Lymph nodes: cervical, supraclavicular, and axillary nodes normal. ?Neurologic: grossly normal ? ?Pelvic: External genitalia:  no lesions ?             No abnormal inguinal nodes palpated. ?  Urethra:  normal appearing urethra with no masses, tenderness or lesions ?             Bartholins and Skenes: normal    ?             Vagina: normal appearing vagina with normal color and discharge, no lesions ?             Cervix: no lesions ?             Pap taken: yes ?Bimanual Exam:  Uterus:  absent ?             Adnexa: no mass, fullness, tenderness ?             Rectal exam: yes.  Confirms. ?             Anus:  normal sphincter tone, no lesions ? ?Chaperone was present for exam:  Kimalexis, CMA ? ?Assessment:   ?Well woman visit with gynecologic exam. ?Status post supracervical hysterectomy.  Ovaries remain.  ?Vaginal atrophy.  ?Hx AGUS pap. LGSIL on colposcopy.  ?HSV I and II. ?FH breast cancer.  ?At increased risk of breast cancer.  Variants of undetermined significance.  ?On Arimidex. ? ?Plan: ?Mammogram screening discussed. ?Plan for yearly breast MRI.   ?Self breast awareness reviewed. ?Pap and HR HPV collected today. ?Guidelines for Calcium, Vitamin D, regular exercise program including cardiovascular and weight bearing exercise. ?Will refill vagifem.  I did discuss potential effect on breast cancer.  ?Refill of Valtrex.  ?Refill of Nystatin powder.  ?She will reach out to oncology to schedule her next visit with them.  ?Follow up annually and prn. ? ?After visit summary provided.  ? ? ? ? ?

## 2022-04-03 NOTE — Patient Instructions (Signed)

## 2022-04-04 ENCOUNTER — Telehealth: Payer: Self-pay | Admitting: Hematology and Oncology

## 2022-04-04 LAB — CYTOLOGY - PAP
Comment: NEGATIVE
Diagnosis: NEGATIVE
High risk HPV: NEGATIVE

## 2022-04-04 NOTE — Telephone Encounter (Signed)
.  Called patient to schedule appointment per 4/4 inbasket, patient is aware of date and time.   ?

## 2022-04-05 NOTE — Progress Notes (Signed)
[place] ?CONSULT NOTE ? ?Patient Care Team: ?Pcp, No as PCP - General ?Nunzio Cobbs, MD as Consulting Physician (Obstetrics and Gynecology) ? ?CHIEF COMPLAINTS/PURPOSE OF CONSULTATION:  ?High risk breast cancer follow up ? ?ASSESSMENT & PLAN:  ? ?This is a very pleasant 61 year old female patient with family history of breast cancer referred to high risk breast cancer clinic for additional recommendations. ? ?During her initial visit we have discussed her 5-year risk of breast cancer and lifetime risk of breast cancer which was 3.6% and 21% respectively.   ? ?She is now on Arimidex for breast cancer prevention. ?With regards to screening, she would like to continue annual MRIs and mammograms.  ?Most recent mammogram in March with no evidence of malignancy.  MRI for September 2023 has been ordered.  She was also given the phone number to call to schedule MRI. ?She continues to stay active, follow healthy diet.   ?She was advised to continue self breast exam once a month. 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  This does not change any recommendations at this time.  She was informed to stay in touch periodically with the genetic counselor if the lab determines that these variants are indeed significant in the future. ?With regards to vaginal estrogens, she appears to be on a small dose of vaginal estrogen, we have once again discussed that the systemic absorption from this could be very small but cannot be completely ruled out.  Hence have advised her to use nonestrogen-based methods to deal with vaginal dryness first such as vitamin E suppositories, coconut oil suppositories.  She expressed understanding. ?Return to clinic in October 2023.  No concerns for breast cancer on today's exam. ? ?HISTORY OF PRESENTING ILLNESS:  ? ?Lisa [name] 61 y.o. female is here because of high risk for breast cancer ? ?This is a very pleasant 61 year old female patient with no  significant past medical history except for uncontrolled high blood pressure referred to high risk breast cancer clinic given her family history of breast cancer.  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  She remembers family history of breast cancer in her mom and her sister, both had cancer in their 39s and had second breast cancer.   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 ?She was initially referred to discuss role of chemoprevention as well as MRI surveillance given her lifetime risk of breast cancer.  ?She had 3 kids, age at first pregnancy of 89.  Used oral contraceptives for about 20 years.  No hormone replacement therapy. ?She is now on anastrozole for breast cancer prevention ? ?Interval History ? ?She is taking anastrazole as prescribed. ?She denies any complaints except for hair thinning and some vaginal dryness.  She has been taking some vaginal estrogen tablets prescribed by gynecology.  She has also tried some vitamin E suppositories which have been very helpful ?No hot flashes, joint aches. ?She gets PRP injections for hair thinning at [facility], she is very pleased with these results. ?She otherwise has not noticed any changes in her breast.  She is willing to proceed with mammograms and MRIs as prescribed.  Rest of the pertinent 10 point ROS reviewed and negative. ? ? ?MEDICAL HISTORY:  ?Past Medical History:  ?Diagnosis Date  ? Abnormal Pap smear of cervix   ? 2020 - AGUS pap, negative HR HPV, colposcopy showing LGSIL on biopsy and  ECC  ? Alopecia   ? Carpal tunnel syndrome of right wrist   ? Elevated cholesterol   ? Family history of adverse reaction to anesthesia   ? sister has naseau  ? Family history of pancreatic cancer 06/22/2021  ? 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 Hypertension   ? Increased risk of breast cancer 2018  ? 27.7 lifetime risk.  Yearly MRI of breast.   ? Low back pain   ? OAB (overactive bladder)   ? Pneumonia   ? Seizures  ([place])   ? ? ?SURGICAL HISTORY: ?Past Surgical History:  ?Procedure Laterality Date  ? ABDOMINAL HYSTERECTOMY  06/23/2004  ? supracervical hysterectomy.  Ovaries remain.  ? CESAREAN SECTION    ? CRYOTHERAPY    ? ECTOPIC PREGNANCY SURGERY    ? LUMBAR LAMINECTOMY/DECOMPRESSION MICRODISCECTOMY Left 05/07/2017  ? Procedure: Decompression L4-L5 and excision of synovial cyst Left;  Surgeon: Latanya Maudlin, MD;  Location: [facility];  Service: Orthopedics;  Laterality: Left;  ? TRIGGER FINGER RELEASE    ? ? ?SOCIAL HISTORY: ?Social History  ? ?Socioeconomic History  ? Marital status: Widowed  ?  Spouse name: Not on file  ? Number of children: Not on file  ? Years of education: Not on file  ? Highest education level: Not on file  ?Occupational History  ? Not on file  ?Tobacco Use  ? Smoking status: Former  ?  Types: Cigarettes  ?  Quit date: 12/31/1996  ?  Years since quitting: 25.2  ? Smokeless tobacco: Never  ?Vaping Use  ? Vaping Use: Never used  ?Substance and Sexual Activity  ? Alcohol use: Yes  ?  Alcohol/week: 1.0 - 2.0 standard drink  ?  Types: 1 - 2 Standard drinks or equivalent per week  ? Drug use: No  ? Sexual activity: Yes  ?  Birth control/protection: Surgical, Post-menopausal, Condom  ?  Comment: hysterectomy  ?Other Topics Concern  ? Not on file  ?Social History Narrative  ? Not on file  ? ?Social Determinants of Health  ? ?Financial Resource Strain: Not on file  ?Food Insecurity: Not on file  ?Transportation Needs: Not on file  ?Physical Activity: Not on file  ?Stress: Not on file  ?Social Connections: Not on file  ?Intimate Partner Violence: Not on file  ? ? ?FAMILY HISTORY: ?Family History  ?Problem Relation Age of Onset  ? Breast cancer Mother 72  ?     second primary at age 51  ? Hypertension Mother   ? Lung cancer Father 8  ?     smoking hx  ? Diabetes Father   ? Hypertension Father   ? Breast cancer Sister 62  ?     recurrence 10 yrs later and bilateral mastectomies  ? Hypertension Sister   ? Breast cancer  Maternal Aunt   ?     dx before 50, x4  ? Breast cancer Maternal Aunt   ?     dx after 50  ? Pancreatic cancer Maternal Aunt   ? Kidney cancer Maternal Grandmother   ?     dx 69s  ? Breast cancer Cousin   ?     several female maternal cousins with premenopausal breast cancer  ? ? ?ALLERGIES:  has No Known Allergies. ? ?MEDICATIONS:  ?Current Outpatient Medications  ?Medication Sig Dispense Refill  ? anastrozole (ARIMIDEX) 1 MG tablet Take 1 tablet (1 mg total) by mouth daily. 30 tablet 3  ?  cetirizine (ZYRTEC) 10 MG tablet Take 1 tablet (10 mg total) by mouth daily. 90 tablet 3  ? Cholecalciferol (VITAMIN D) 50 MCG (2000 UT) CAPS Take 1 capsule (2,000 Units total) by mouth daily. 30 capsule   ? clobetasol cream (TEMOVATE) 0.05 % Once daily three times a week 30 g 1  ? Dentifrices (SENSITIVE TOOTHPASTE/FLUORIDE) 5-0.243 % PSTE Use to brush teeth 2x daily. 300 g 3  ? Estradiol 10 MCG TABS vaginal tablet Insert 1 tablet (10 mcg) vaginally twice a week at night. 24 tablet 3  ? finasteride (PROSCAR) 5 MG tablet Take 1 tablet (5 mg total) by mouth daily. 90 tablet 1  ? hydrochlorothiazide (HYDRODIURIL) 25 MG tablet Take 1/2 tablet once a day 45 tablet 3  ? LORazepam (ATIVAN) 0.5 MG tablet Take 1 tablet (0.5 mg total) by mouth 2 (two) times daily as needed for anxiety. TAKE 1 TABLET(0.5 MG) BY MOUTH TWICE DAILY AS NEEDED FOR ANXIETY 30 tablet 0  ? methocarbamol (ROBAXIN) 500 MG tablet Take 1 tablet (500 mg total) by mouth every 6 (six) hours as needed for muscle spasms. 180 tablet 0  ? nystatin powder Apply to affected areas three times a day as needed 60 g 2  ? olopatadine (PATANOL) 0.1 % ophthalmic solution Place 1 drop into both eyes 2 (two) times daily. 15 mL 3  ? Omega-3 Fatty Acids (FISH OIL) 1000 MG CAPS Take 1 capsule daily 30 capsule 0  ? phenytoin (DILANTIN) 100 MG ER capsule Take 2 capsules (200 mg total) by mouth every evening. 180 capsule 3  ? scopolamine (TRANSDERM-SCOP, 1.5 MG,) 1 MG/3DAYS Place 1 patch (1.5  mg total) onto the skin every 3 (three) days. 10 patch 1  ? Sod Fluoride-Potassium Nitrate (PREVIDENT 5000 SENSITIVE) 1.1-5 % PSTE PreviDent 5000 Sensitive 1.1 %-5 % dental paste 100 mL 5  ? traZODone (DE

## 2022-04-06 ENCOUNTER — Inpatient Hospital Stay: Payer: BC Managed Care – PPO | Attending: Hematology and Oncology | Admitting: Hematology and Oncology

## 2022-04-06 ENCOUNTER — Other Ambulatory Visit: Payer: Self-pay

## 2022-04-06 ENCOUNTER — Encounter: Payer: Self-pay | Admitting: Hematology and Oncology

## 2022-04-06 VITALS — BP 138/79 | HR 64 | Temp 97.7°F | Resp 18 | Ht 61.0 in | Wt 172.8 lb

## 2022-04-06 DIAGNOSIS — Z803 Family history of malignant neoplasm of breast: Secondary | ICD-10-CM | POA: Diagnosis not present

## 2022-04-06 DIAGNOSIS — Z9189 Other specified personal risk factors, not elsewhere classified: Secondary | ICD-10-CM

## 2022-04-09 ENCOUNTER — Telehealth: Payer: Self-pay | Admitting: Hematology and Oncology

## 2022-04-09 NOTE — Telephone Encounter (Signed)
Per 4/7 los.  Spoke to pt and pt confirmed the appointment  ?

## 2022-09-26 ENCOUNTER — Ambulatory Visit (HOSPITAL_COMMUNITY): Payer: BC Managed Care – PPO

## 2022-09-29 ENCOUNTER — Ambulatory Visit (HOSPITAL_COMMUNITY)
Admission: RE | Admit: 2022-09-29 | Discharge: 2022-09-29 | Disposition: A | Discharge status: Home / Self Care | Payer: BC Managed Care – PPO | Source: Ambulatory Visit | Attending: Hematology and Oncology | Admitting: Hematology and Oncology

## 2022-09-29 DIAGNOSIS — Z9189 Other specified personal risk factors, not elsewhere classified: Secondary | ICD-10-CM | POA: Insufficient documentation

## 2022-09-29 DIAGNOSIS — Z803 Family history of malignant neoplasm of breast: Secondary | ICD-10-CM | POA: Diagnosis present

## 2022-09-29 MED ORDER — GADOPICLENOL 0.5 MMOL/ML IV SOLN
8.0000 mL | Freq: Once | INTRAVENOUS | Status: AC | PRN
  Administered 2022-09-29: 8 mL via INTRAVENOUS

## 2022-10-01 ENCOUNTER — Ambulatory Visit: Admission: RE | Admit: 2022-10-01 | Payer: BC Managed Care – PPO | Source: Ambulatory Visit

## 2022-10-09 ENCOUNTER — Other Ambulatory Visit: Payer: Self-pay

## 2022-10-09 ENCOUNTER — Encounter: Payer: Self-pay | Admitting: Hematology and Oncology

## 2022-10-09 ENCOUNTER — Inpatient Hospital Stay: Payer: BC Managed Care – PPO | Attending: Hematology and Oncology | Admitting: Hematology and Oncology

## 2022-10-09 VITALS — BP 148/75 | HR 62 | Temp 97.9°F | Resp 16 | Ht 61.0 in | Wt 173.8 lb

## 2022-10-09 DIAGNOSIS — Z9189 Other specified personal risk factors, not elsewhere classified: Secondary | ICD-10-CM | POA: Diagnosis not present

## 2022-10-09 DIAGNOSIS — I10 Essential (primary) hypertension: Secondary | ICD-10-CM | POA: Diagnosis not present

## 2022-10-09 DIAGNOSIS — Z87891 Personal history of nicotine dependence: Secondary | ICD-10-CM | POA: Diagnosis not present

## 2022-10-09 DIAGNOSIS — Z79899 Other long term (current) drug therapy: Secondary | ICD-10-CM | POA: Diagnosis not present

## 2022-10-09 DIAGNOSIS — Z800 Family history of malignant neoplasm of digestive organs: Secondary | ICD-10-CM | POA: Insufficient documentation

## 2022-10-09 DIAGNOSIS — Z803 Family history of malignant neoplasm of breast: Secondary | ICD-10-CM | POA: Diagnosis not present

## 2022-10-09 DIAGNOSIS — Z79811 Long term (current) use of aromatase inhibitors: Secondary | ICD-10-CM | POA: Diagnosis not present

## 2022-10-09 DIAGNOSIS — Z801 Family history of malignant neoplasm of trachea, bronchus and lung: Secondary | ICD-10-CM | POA: Insufficient documentation

## 2022-10-09 DIAGNOSIS — Z808 Family history of malignant neoplasm of other organs or systems: Secondary | ICD-10-CM | POA: Insufficient documentation

## 2022-10-09 MED ORDER — ANASTROZOLE 1 MG PO TABS: 1.0000 mg | ORAL_TABLET | Freq: Every day | ORAL | 3 refills | Status: DC

## 2022-10-09 NOTE — Progress Notes (Signed)
[place] CONSULT NOTE  Patient Care Team: Bartholome Bill, MD as PCP - General (Family Medicine) Nunzio Cobbs, MD as Consulting Physician (Obstetrics and Gynecology)  CHIEF COMPLAINTS/PURPOSE OF CONSULTATION:  High risk breast cancer follow up  ASSESSMENT & PLAN:   This is a very pleasant 61 year old female patient with family history of breast cancer referred to high risk breast cancer clinic for additional recommendations.  During her initial visit we have discussed her 5-year risk of breast cancer and lifetime risk of breast cancer which was 3.6% and 21% respectively.    She is now on Arimidex for breast cancer prevention. With regards to screening, she would like to continue annual MRIs and mammograms.  Last breast MRI 09/29/2022 negative for malignancy. Physical examination today without any concerns for malignancy.  No palpable breast masses or regional adenopathy She would like to alternate mammograms and MRIs but would prefer to do MRI every other year since long-term risks of gadolinium deposition are unknown at this time.  I think this is a reasonable approach.  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  This does not change any recommendations at this time.  She was informed to stay in touch periodically with the genetic counselor if the lab determines that these variants are indeed significant in the future.  She is using vitamin E as well as coconut oil suppositories for vaginal dryness.  Mom was recently diagnosed with squamous cell carcinoma of the vagina.  We talked about this not being an inherited or genetic predisposition usually.  Return to clinic in October 2024  HISTORY OF PRESENTING ILLNESS:   Lisa [name] 61 y.o. female is here because of high risk for breast cancer  This is a very pleasant 61 year old female patient with no significant past medical history  except for uncontrolled high blood pressure referred to high risk breast cancer clinic given her family history of breast cancer.  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  She remembers family history of breast cancer in her mom and her sister, both had cancer in their 9s and had second breast cancer.   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 She was initially referred to discuss role of chemoprevention as well as MRI surveillance given her lifetime risk of breast cancer.  She had 3 kids, age at first pregnancy of 61.  Used oral contraceptives for about 20 years.  No hormone replacement therapy. She is now on anastrozole for breast cancer prevention  Interval History  She is taking anastrazole as prescribed. She denies any adverse effects She recently had a MRI breast which was unremarkable. Rest of the pertinent 10 point ROS reviewed and negative.   MEDICAL HISTORY:  Past Medical History:  Diagnosis Date   Abnormal Pap smear of cervix    2020 - AGUS pap, negative HR HPV, colposcopy showing LGSIL on biopsy and ECC   Alopecia    Carpal tunnel syndrome of right wrist    Elevated cholesterol    Family history of adverse reaction to anesthesia    sister has naseau   Family history of pancreatic cancer 06/22/2021   HSV infection    Type I and II   Hypertension    Increased risk of breast cancer 2018   27.7 lifetime risk.  Yearly MRI of breast.    Low back  pain    OAB (overactive bladder)    Pneumonia    Seizures ([place])     SURGICAL HISTORY: Past Surgical History:  Procedure Laterality Date   ABDOMINAL HYSTERECTOMY  06/23/2004   supracervical hysterectomy.  Ovaries remain.   CESAREAN SECTION     CRYOTHERAPY     ECTOPIC PREGNANCY SURGERY     LUMBAR LAMINECTOMY/DECOMPRESSION MICRODISCECTOMY Left 05/07/2017   Procedure: Decompression L4-L5 and excision of synovial cyst Left;  Surgeon: Latanya Maudlin, MD;  Location: [facility];   Service: Orthopedics;  Laterality: Left;   TRIGGER FINGER RELEASE      SOCIAL HISTORY: Social History   Socioeconomic History   Marital status: Widowed    Spouse name: Not on file   Number of children: Not on file   Years of education: Not on file   Highest education level: Not on file  Occupational History   Not on file  Tobacco Use   Smoking status: Former    Types: Cigarettes    Quit date: 12/31/1996    Years since quitting: 25.7   Smokeless tobacco: Never  Vaping Use   Vaping Use: Never used  Substance and Sexual Activity   Alcohol use: Yes    Alcohol/week: 1.0 - 2.0 standard drink of alcohol    Types: 1 - 2 Standard drinks or equivalent per week   Drug use: No   Sexual activity: Yes    Birth control/protection: Surgical, Post-menopausal, Condom    Comment: hysterectomy  Other Topics Concern   Not on file  Social History Narrative   Not on file   Social Determinants of Health   Financial Resource Strain: Not on file  Food Insecurity: Not on file  Transportation Needs: Not on file  Physical Activity: Not on file  Stress: Not on file  Social Connections: Not on file  Intimate Partner Violence: Not on file    FAMILY HISTORY: Family History  Problem Relation Age of Onset   Breast cancer Mother 54       second primary at age 14   Hypertension Mother    Lung cancer Father 31       smoking hx   Diabetes Father    Hypertension Father    Breast cancer Sister 70       recurrence 10 yrs later and bilateral mastectomies   Hypertension Sister    Breast cancer Maternal Aunt        dx before 19, x4   Breast cancer Maternal Aunt        dx after 50   Pancreatic cancer Maternal Aunt    Kidney cancer Maternal Grandmother        dx 24s   Breast cancer Cousin        several female maternal cousins with premenopausal breast cancer    ALLERGIES:  has No Known Allergies.  MEDICATIONS:  Current Outpatient Medications  Medication Sig Dispense Refill   anastrozole  (ARIMIDEX) 1 MG tablet Take 1 tablet (1 mg total) by mouth daily. 30 tablet 3   cetirizine (ZYRTEC) 10 MG tablet Take 1 tablet (10 mg total) by mouth daily. 90 tablet 3   Cholecalciferol (VITAMIN D) 50 MCG (2000 UT) CAPS Take 1 capsule (2,000 Units total) by mouth daily. 30 capsule    clobetasol cream (TEMOVATE) 0.05 % Once daily three times a week 30 g 1   Dentifrices (SENSITIVE TOOTHPASTE/FLUORIDE) 5-0.243 % PSTE Use to brush teeth 2x daily. 300 g 3   Estradiol 10 MCG TABS  vaginal tablet Insert 1 tablet (10 mcg) vaginally twice a week at night. 24 tablet 3   finasteride (PROSCAR) 5 MG tablet Take 1 tablet (5 mg total) by mouth daily. 90 tablet 1   hydrochlorothiazide (HYDRODIURIL) 25 MG tablet Take 1/2 tablet once a day 45 tablet 3   LORazepam (ATIVAN) 0.5 MG tablet Take 1 tablet (0.5 mg total) by mouth 2 (two) times daily as needed for anxiety. TAKE 1 TABLET(0.5 MG) BY MOUTH TWICE DAILY AS NEEDED FOR ANXIETY 30 tablet 0   methocarbamol (ROBAXIN) 500 MG tablet Take 1 tablet (500 mg total) by mouth every 6 (six) hours as needed for muscle spasms. 180 tablet 0   nystatin powder Apply to affected areas three times a day as needed 60 g 2   olopatadine (PATANOL) 0.1 % ophthalmic solution Place 1 drop into both eyes 2 (two) times daily. 15 mL 3   Omega-3 Fatty Acids (FISH OIL) 1000 MG CAPS Take 1 capsule daily 30 capsule 0   phenytoin (DILANTIN) 100 MG ER capsule Take 2 capsules (200 mg total) by mouth every evening. 180 capsule 3   scopolamine (TRANSDERM-SCOP, 1.5 MG,) 1 MG/3DAYS Place 1 patch (1.5 mg total) onto the skin every 3 (three) days. 10 patch 1   Sod Fluoride-Potassium Nitrate (PREVIDENT 5000 SENSITIVE) 1.1-5 % PSTE PreviDent 5000 Sensitive 1.1 %-5 % dental paste 100 mL 5   traZODone (DESYREL) 50 MG tablet Take 0.5-1 tablets (25-50 mg total) by mouth at bedtime as needed for sleep. 30 tablet 3   valACYclovir (VALTREX) 1000 MG tablet (1/2) tablet daily for suppression- may increase to twice  daily x3 days at onset of outbreak 45 tablet 3   vitamin C (ASCORBIC ACID) 500 MG tablet Take 2 tablets (1,000 mg total) by mouth daily.     No current facility-administered medications for this visit.   PHYSICAL EXAMINATION: ECOG PERFORMANCE STATUS: 0 - Asymptomatic  There were no vitals filed for this visit.   There were no vitals filed for this visit.  Physical Exam Constitutional:      Appearance: Normal appearance.  Cardiovascular:     Rate and Rhythm: Normal rate and regular rhythm.     Pulses: Normal pulses.     Heart sounds: Normal heart sounds.  Chest:     Comments: Bilateral breast examined.  No palpable masses or regional adenopathy Abdominal:     General: Abdomen is flat. Bowel sounds are normal.  Musculoskeletal:     Cervical back: Normal range of motion and neck supple. No rigidity.  Lymphadenopathy:     Cervical: No cervical adenopathy.  Neurological:     General: No focal deficit present.     Mental Status: She is alert.  Psychiatric:        Mood and Affect: Mood normal.      LABORATORY DATA:  I have reviewed the data as listed Lab Results  Component Value Date   WBC 5.1 03/07/2021   HGB 13.5 03/07/2021   HCT 40.1 03/07/2021   MCV 87.0 03/07/2021   PLT 256 03/07/2021     Chemistry      Component Value Date/Time   NA 138 03/07/2021 0959   NA 138 06/03/2012 0000   K 4.2 03/07/2021 0959   CL 103 03/07/2021 0959   CO2 22 03/07/2021 0959   BUN 15 03/07/2021 0959   BUN 13 03/23/2013 0000   CREATININE 0.96 03/07/2021 0959   GLU 89 03/23/2013 0000      Component Value  Date/Time   CALCIUM 9.2 03/07/2021 0959   ALKPHOS 95 06/03/2017 0913   AST 19 03/07/2021 0959   ALT 14 03/07/2021 0959   BILITOT 0.3 03/07/2021 0959     I have reviewed pertinent mammogram and MRI results.  Have reviewed pertinent genetic testing results and risk scores based on models calculated.  Lifetime risk of breast cancer per TC model is 21%. 5-year risk of breast  cancer per [facility] model is 3.6%.  RADIOGRAPHIC STUDIES: I have personally reviewed the radiological images as listed and agreed with the findings in the report. MR BREAST BILATERAL W WO CONTRAST INC CAD  Result Date: 10/01/2022 CLINICAL DATA:  High risk screening. Lifetime risk greater than 30%. EXAM: BILATERAL BREAST MRI WITH AND WITHOUT CONTRAST TECHNIQUE: Multiplanar, multisequence MR images of both breasts were obtained prior to and following the intravenous administration of 8 ml of Gadavist Three-dimensional MR images were rendered by post-processing of the original MR data on an independent workstation. The three-dimensional MR images were interpreted, and findings are reported in the following complete MRI report for this study. Three dimensional images were evaluated at the independent interpreting workstation using the DynaCAD thin client. COMPARISON:  Previous exam(s). FINDINGS: Breast composition: b. Scattered fibroglandular tissue. Background parenchymal enhancement: Minimal Right breast: No mass or abnormal enhancement. Left breast: No mass or abnormal enhancement. Lymph nodes: No abnormal appearing lymph nodes. Ancillary findings:  None. IMPRESSION: No MRI evidence of malignancy in either breast. RECOMMENDATION: 1.  Continue annual screening mammography. 2.  Continue annual high risk screening breast MRI. BI-RADS CATEGORY  1: Negative. Electronically Signed   By: Audie Pinto M.D.   On: 10/01/2022 16:11   I spent 30 minutes in the care of this patient including history, physical, review of records, counseling and coordination of care   Benay Pike, MD 10/09/2022 8:57 AM

## 2022-11-15 ENCOUNTER — Encounter: Payer: Self-pay | Admitting: Obstetrics and Gynecology

## 2022-11-15 DIAGNOSIS — Z8619 Personal history of other infectious and parasitic diseases: Secondary | ICD-10-CM

## 2022-11-15 MED ORDER — VALACYCLOVIR HCL 1 G PO TABS: ORAL_TABLET | ORAL | 1 refills | Status: DC

## 2022-11-15 NOTE — Telephone Encounter (Signed)
Last annual exam 04/03/22

## 2022-12-19 IMAGING — MR MR BREAST BILAT WO/W CM
8 of 12 series · 33 of 48 positions shown · IV contrast (8 ml gadavist)
Comparison: None.

CLINICAL DATA: High risk screening. Strong family history of breast
cancer.

LABS:  None.
EXAM:
BILATERAL BREAST MRI WITH AND WITHOUT CONTRAST
TECHNIQUE: Multiplanar, multisequence MR images of both breasts were obtained
prior to and following the intravenous administration of 8 ml of
Gadavist

[Series 2: t2_tirm_tra ipat (a-p) · axial · 3.0mm · 0.70mm/px · 1 of 55 slices shown]
[im 1/55]
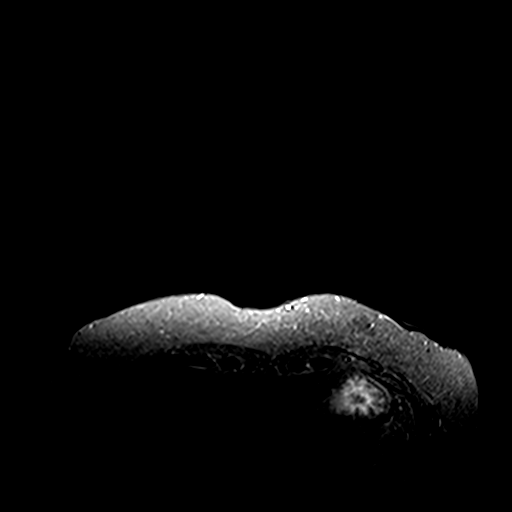

[Series 3: fl3d pre-cm no · axial · non-contrast · 1.2mm · 0.94mm/px · z∈[-99,+54]mm · 5 of 128 slices shown]
[im 1/128]
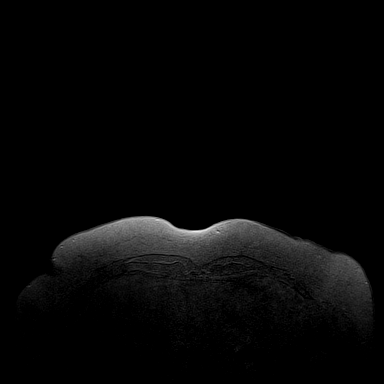
[im 32/128]
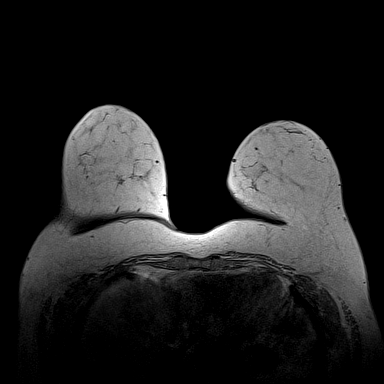
[im 64/128]
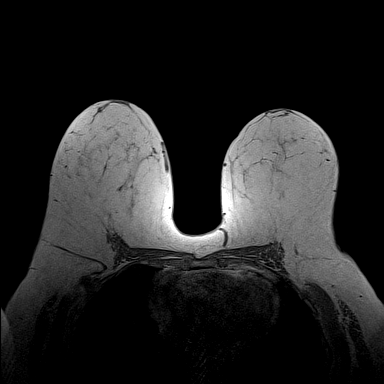
[im 96/128]
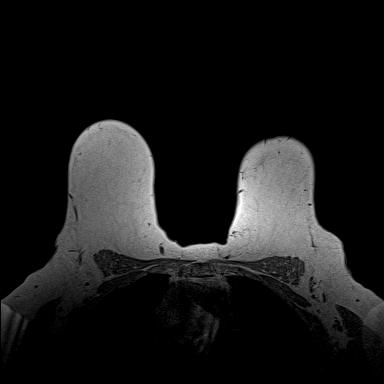
[im 128/128]
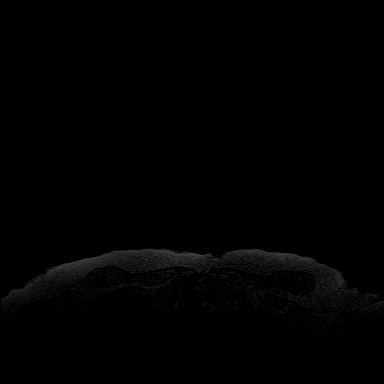

[Series 4: fl3d pre-cm · axial · non-contrast · 1.2mm · 0.94mm/px · z∈[-99,+54]mm · 5 of 128 slices shown]
[im 1/128]
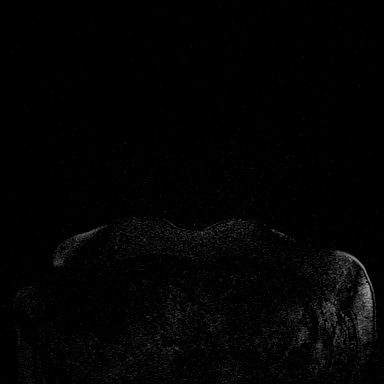
[im 32/128]
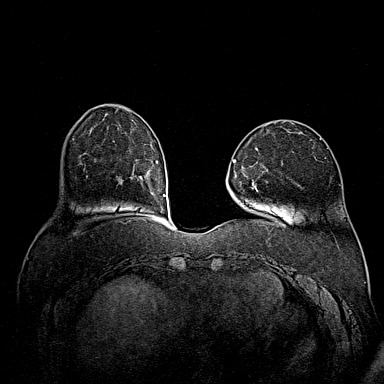
[im 64/128]
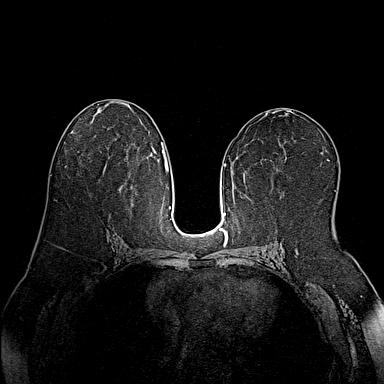
[im 96/128]
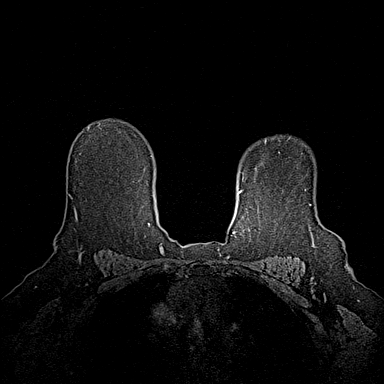
[im 128/128]
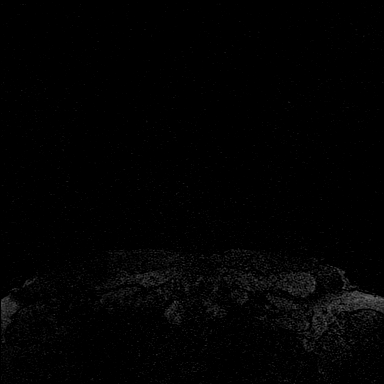

[Series 5: fl3d post-cm 20 · axial · 1.2mm · 0.94mm/px · z∈[-99,+54]mm · 5 of 128 slices shown (1 of 3)]
[im 1/128]
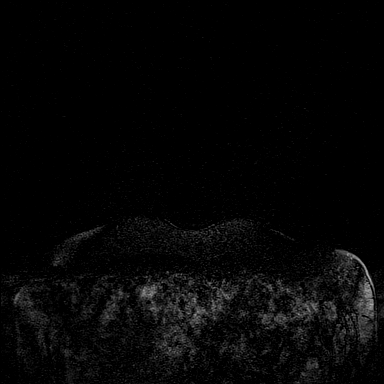
[im 32/128]
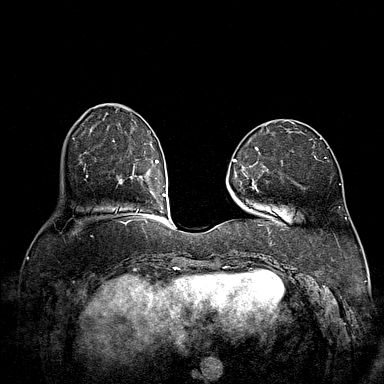
[im 64/128]
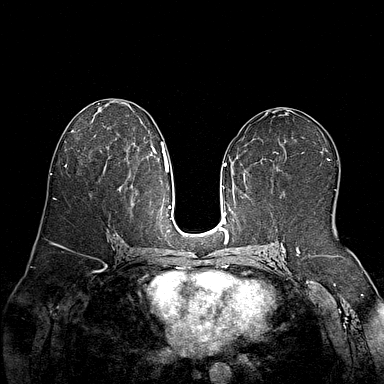
[im 96/128]
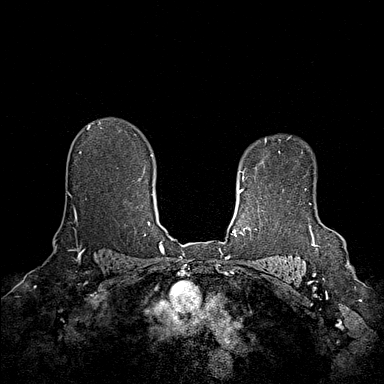
[im 128/128]
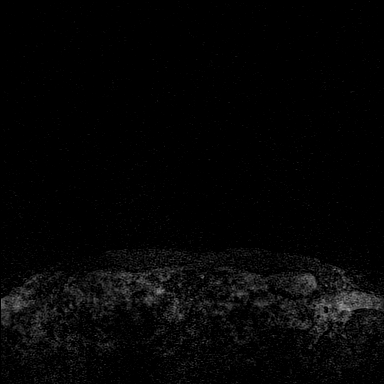

[Series 6: fl3d post-cm 20 · axial · 1.2mm · 0.94mm/px · z∈[-99,+54]mm · 5 of 128 slices shown (2 of 3)]
[im 1/128]
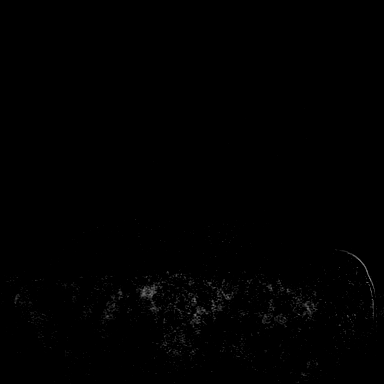
[im 32/128]
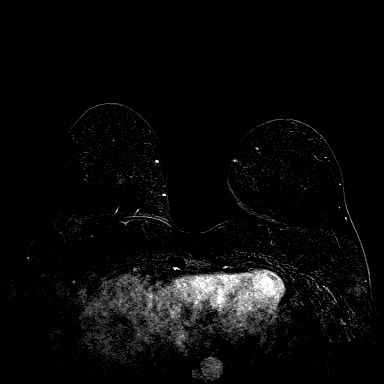
[im 64/128]
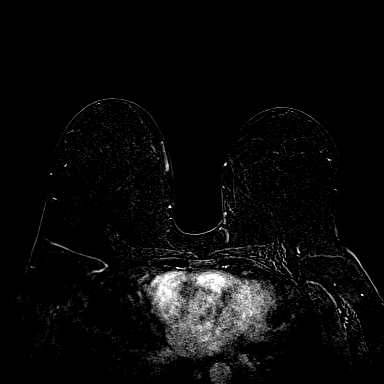
[im 96/128]
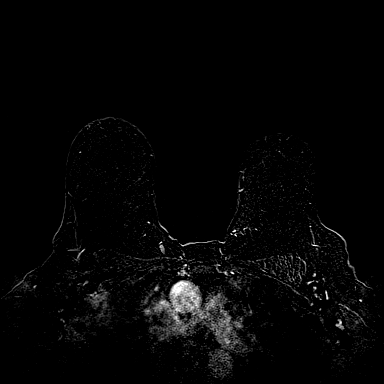
[im 128/128]
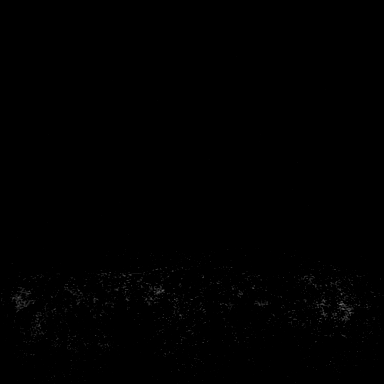

[Series 7: fl3d post-cm 20 · axial · 153.6mm · 0.94mm/px · 1 of 1 slices shown (3 of 3)]
[im 1/1]
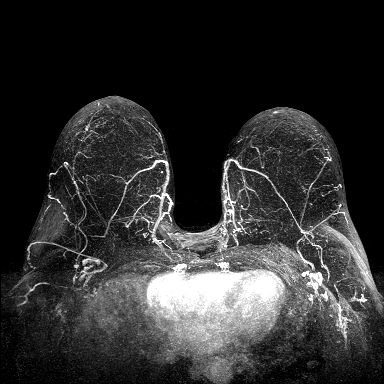

[Series 8: fl3d post-cm 3min · axial · 1.2mm · 0.94mm/px · z∈[-99,+54]mm · 6 of 128 slices shown]
[im 1/128]
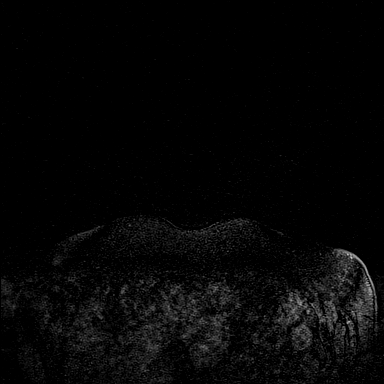
[im 26/128]
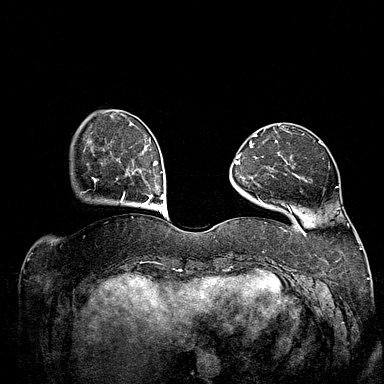
[im 51/128]
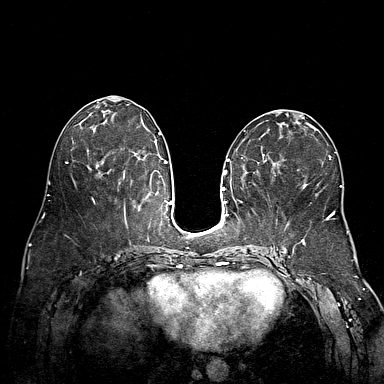
[im 77/128]
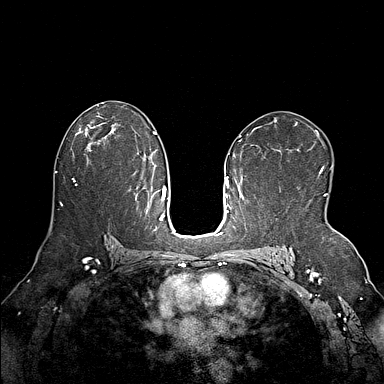
[im 102/128]
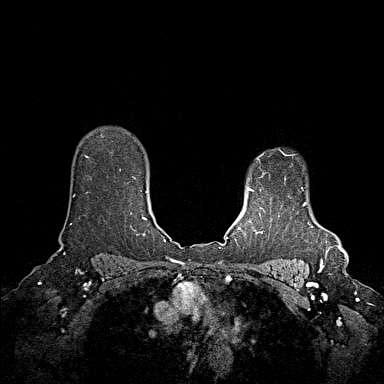
[im 128/128]
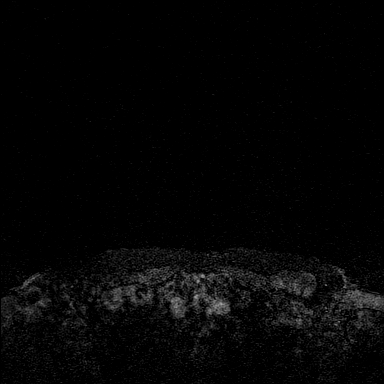

[Series 9: fl3d post-cm 3min_sub · axial · 1.2mm · 0.94mm/px · z∈[-99,+22]mm · 5 of 128 slices shown]
[im 1/128]
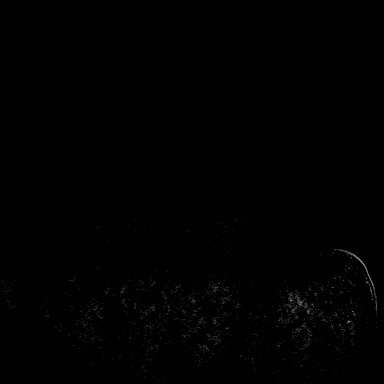
[im 26/128]
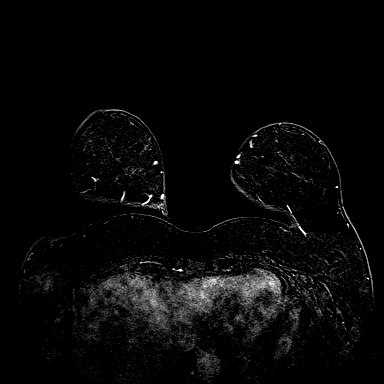
[im 51/128]
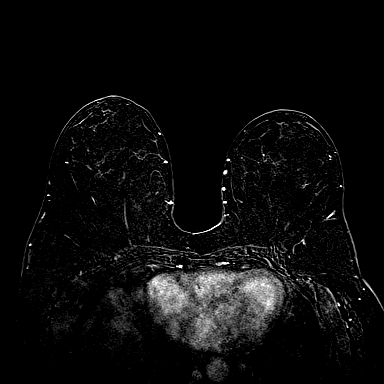
[im 77/128]
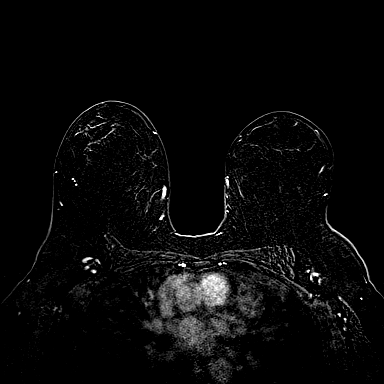
[im 102/128]
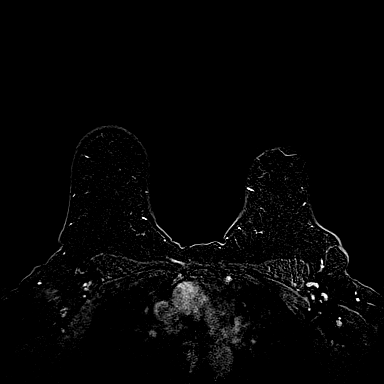

[33 of 48 positions shown; findings below may reference images not displayed]

Three-dimensional MR images were rendered by post-processing of the
original MR data on an independent workstation. The
three-dimensional MR images were interpreted, and findings are
reported in the following complete MRI report for this study. Three
dimensional images were evaluated at the independent interpreting
workstation using the DynaCAD thin client.
FINDINGS: Breast composition: b. Scattered fibroglandular tissue.

Background parenchymal enhancement: Minimal

Right breast: No mass or abnormal enhancement.

Left breast: No mass or abnormal enhancement.

Lymph nodes: No abnormal appearing lymph nodes.

Ancillary findings:  None.
IMPRESSION: No MRI evidence of malignancy in either breast.

RECOMMENDATION:
1.  Continue routine annual screening mammography.

2. Continue annual high risk screening breast MRI given strong
family history of breast cancer.

BI-RADS CATEGORY  1: Negative.

## 2023-01-07 ENCOUNTER — Other Ambulatory Visit: Payer: Self-pay | Admitting: *Deleted

## 2023-01-07 MED ORDER — ANASTROZOLE 1 MG PO TABS: 1.0000 mg | ORAL_TABLET | Freq: Every day | ORAL | 0 refills | Status: DC

## 2023-01-07 NOTE — Telephone Encounter (Signed)
Refill request called to our office.  Refill approved as patient is tolerating and is to continue therapy.  Has follow up scheduled.

## 2023-01-15 ENCOUNTER — Encounter: Payer: Self-pay | Admitting: Obstetrics and Gynecology

## 2023-01-15 ENCOUNTER — Encounter: Payer: Self-pay | Admitting: Hematology and Oncology

## 2023-01-15 DIAGNOSIS — Z8619 Personal history of other infectious and parasitic diseases: Secondary | ICD-10-CM

## 2023-01-15 MED ORDER — VALACYCLOVIR HCL 1 G PO TABS: ORAL_TABLET | ORAL | 0 refills | Status: DC

## 2023-01-15 NOTE — Telephone Encounter (Signed)
AEX 4//03/2022

## 2023-01-16 ENCOUNTER — Other Ambulatory Visit: Payer: Self-pay | Admitting: *Deleted

## 2023-01-16 MED ORDER — ANASTROZOLE 1 MG PO TABS: 1.0000 mg | ORAL_TABLET | Freq: Every day | ORAL | 0 refills | Status: DC

## 2023-03-15 ENCOUNTER — Ambulatory Visit: Payer: BC Managed Care – PPO

## 2023-03-15 ENCOUNTER — Ambulatory Visit
Admission: RE | Admit: 2023-03-15 | Discharge: 2023-03-15 | Disposition: A | Discharge status: Home / Self Care | Payer: BC Managed Care – PPO | Source: Ambulatory Visit | Attending: Hematology and Oncology | Admitting: Hematology and Oncology

## 2023-03-15 DIAGNOSIS — Z9189 Other specified personal risk factors, not elsewhere classified: Secondary | ICD-10-CM

## 2023-03-25 NOTE — Progress Notes (Unsigned)
62 y.o. [id number] Widowed [place] female here for annual exam.  Pt wants to discuss consistent vaginal itching. No discharge.  She did recent treatment with Metrogel for BV through PCP in March.   Seeing medical oncology for increased risk of breast cancer.  Is on Arimidex.  Patient is not using Vagifem.   Has tried vagina vitamin E and coconut oil.  Mother has been dx with vaginal cancer.   PCP:   Virl Sonammy Boyd, MD  Patient's last menstrual period was 12/31/2004 (approximate).           Sexually active: Yes.    The current method of family planning is status post hysterectomy.    Exercising: No.   Smoker:  former  Health Maintenance: Pap:  04/03/22 neg: HR HPV neg, 03/15/21 neg: HR HPV neg History of abnormal Pap:  yes, cryo to cervix MMG:  03/15/23 Breast Density Category B,  BI-RADS CAT 1 neg Breast MRI 09/29/22 - Bi-RADS1 Colonoscopy:  03/09/16 BMD:   n/a  Result  n/a TDaP:  12/13/17 Gardasil:   no HIV: 12/13/21 NR Hep C: 12/13/21 NR Screening Labs:  PCP   reports that she quit smoking about 26 years ago. Her smoking use included cigarettes. She has never used smokeless tobacco. She reports current alcohol use of about 1.0 - 2.0 standard drink of alcohol per week. She reports that she does not use drugs.  Past Medical History:  Diagnosis Date   Abnormal Pap smear of cervix    2020 - AGUS pap, negative HR HPV, colposcopy showing LGSIL on biopsy and ECC   Alopecia    Carpal tunnel syndrome of right wrist    Elevated cholesterol    Family history of adverse reaction to anesthesia    sister has naseau   Family history of pancreatic cancer 06/22/2021   HSV infection    Type I and II   Hypertension    Increased risk of breast cancer 2018   27.7 lifetime risk.  Yearly MRI of breast.    Low back pain    OAB (overactive bladder)    Pneumonia    Seizures     Past Surgical History:  Procedure Laterality Date   ABDOMINAL HYSTERECTOMY  06/23/2004   supracervical  hysterectomy.  Ovaries remain.   CESAREAN SECTION     CRYOTHERAPY     ECTOPIC PREGNANCY SURGERY     LUMBAR LAMINECTOMY/DECOMPRESSION MICRODISCECTOMY Left 05/07/2017   Procedure: Decompression L4-L5 and excision of synovial cyst Left;  Surgeon: Ranee GosselinGioffre, Ronald, MD;  Location: [facility];  Service: Orthopedics;  Laterality: Left;   TRIGGER FINGER RELEASE      Current Outpatient Medications  Medication Sig Dispense Refill   anastrozole (ARIMIDEX) 1 MG tablet TAKE 1 TABLET(1 MG) BY MOUTH DAILY 90 tablet 0   cetirizine (ZYRTEC) 10 MG tablet Take 1 tablet (10 mg total) by mouth daily. 90 tablet 3   Cholecalciferol (VITAMIN D) 50 MCG (2000 UT) CAPS Take 1 capsule (2,000 Units total) by mouth daily. 30 capsule    clobetasol cream (TEMOVATE) 0.05 % Once daily three times a week 30 g 1   Dentifrices (SENSITIVE TOOTHPASTE/FLUORIDE) 5-0.243 % PSTE Use to brush teeth 2x daily. 300 g 3   finasteride (PROSCAR) 5 MG tablet Take 1 tablet (5 mg total) by mouth daily. 90 tablet 1   hydrochlorothiazide (HYDRODIURIL) 25 MG tablet Take 1/2 tablet once a day 45 tablet 3   LORazepam (ATIVAN) 0.5 MG tablet Take 1 tablet (0.5 mg total) by  mouth 2 (two) times daily as needed for anxiety. TAKE 1 TABLET(0.5 MG) BY MOUTH TWICE DAILY AS NEEDED FOR ANXIETY 30 tablet 0   methocarbamol (ROBAXIN) 500 MG tablet Take 1 tablet (500 mg total) by mouth every 6 (six) hours as needed for muscle spasms. 180 tablet 0   nystatin powder Apply to affected areas three times a day as needed 60 g 2   olopatadine (PATANOL) 0.1 % ophthalmic solution Place 1 drop into both eyes 2 (two) times daily. 15 mL 3   phenytoin (DILANTIN) 100 MG ER capsule Take 2 capsules (200 mg total) by mouth every evening. 180 capsule 3   REPATHA 140 MG/ML SOSY Inject into the skin.     scopolamine (TRANSDERM-SCOP, 1.5 MG,) 1 MG/3DAYS Place 1 patch (1.5 mg total) onto the skin every 3 (three) days. 10 patch 1   Sod Fluoride-Potassium Nitrate (PREVIDENT 5000 SENSITIVE)  1.1-5 % PSTE PreviDent 5000 Sensitive 1.1 %-5 % dental paste 100 mL 5   traZODone (DESYREL) 50 MG tablet Take 0.5-1 tablets (25-50 mg total) by mouth at bedtime as needed for sleep. 30 tablet 3   vitamin C (ASCORBIC ACID) 500 MG tablet Take 2 tablets (1,000 mg total) by mouth daily.     valACYclovir (VALTREX) 1000 MG tablet (1/2) tablet daily for suppression- may increase to twice daily x3 days at onset of outbreak 45 tablet 3   No current facility-administered medications for this visit.    Family History  Problem Relation Age of Onset   Cancer Mother [id number]       vaginal cancer   Breast cancer Mother 1535       second primary at age 140   Hypertension Mother    Lung cancer Father [id number]       smoking hx   Diabetes Father    Hypertension Father    Breast cancer Sister [id number]       recurrence 10 yrs later and bilateral mastectomies   Hypertension Sister    Breast cancer Maternal Aunt        dx before 450, x4   Breast cancer Maternal Aunt        dx after 50   Pancreatic cancer Maternal Aunt    Kidney cancer Maternal Grandmother        dx 4260s   Breast cancer Cousin        several female maternal cousins with premenopausal breast cancer    Review of Systems  All other systems reviewed and are negative.   Exam:   BP 122/84 (BP Location: Right Arm, Patient Position: Sitting, Cuff Size: Normal)   Pulse 67   Ht 5\' 1"  (1.549 m)   Wt 173 lb (78.5 kg)   LMP 12/31/2004 (Approximate)   SpO2 99%   BMI 32.69 kg/m     General appearance: alert, cooperative and appears stated age Head: normocephalic, without obvious abnormality, atraumatic Neck: no adenopathy, supple, symmetrical, trachea midline and thyroid normal to inspection and palpation Lungs: clear to auscultation bilaterally Breasts: normal appearance, no masses or tenderness, No nipple retraction or dimpling, No nipple discharge or bleeding, No axillary adenopathy Heart: regular rate and rhythm Abdomen: soft, non-tender; no masses, no  organomegaly Extremities: extremities normal, atraumatic, no cyanosis or edema Skin: skin color, texture, turgor normal. No rashes or lesions Lymph nodes: cervical, supraclavicular, and axillary nodes normal. Neurologic: grossly normal  Pelvic: External genitalia:  no lesions              No  abnormal inguinal nodes palpated.              Urethra:  normal appearing urethra with no masses, tenderness or lesions              Bartholins and Skenes: normal                 Vagina: normal appearing vagina with normal color and discharge, no lesions              Cervix: no lesions              Pap taken: no Bimanual Exam:  Uterus:  absent              Adnexa: no mass, fullness, tenderness              Rectal exam: yes.  Confirms.              Anus:  normal sphincter tone, no lesions  Chaperone was present for exam:  Warren Lacy, CMA  Assessment:   Well woman visit with gynecologic exam. Status post supracervical hysterectomy.  Ovaries remain.  Vaginal atrophy.  Hx AGUS pap. LGSIL on colposcopy.  HSV I and II. FH breast cancer.  At increased risk of breast cancer.  Variants of undetermined significance.  On Arimidex. Vaginitis.  Off Vagifem.   Plan: Mammogram screening discussed. Self breast awareness reviewed. Pap and HR HPV in 2026. Guidelines for Calcium, Vitamin D, regular exercise program including cardiovascular and weight bearing exercise. Vaginitis testing with Nuswab.  Fill of Valtrex. Follow up annually and prn.   After visit summary provided.

## 2023-04-04 ENCOUNTER — Other Ambulatory Visit: Payer: Self-pay | Admitting: Hematology and Oncology

## 2023-04-08 ENCOUNTER — Other Ambulatory Visit (HOSPITAL_COMMUNITY)
Admission: RE | Admit: 2023-04-08 | Discharge: 2023-04-08 | Disposition: A | Discharge status: Home / Self Care | Payer: BC Managed Care – PPO | Source: Ambulatory Visit | Attending: Obstetrics and Gynecology | Admitting: Obstetrics and Gynecology

## 2023-04-08 ENCOUNTER — Ambulatory Visit (INDEPENDENT_AMBULATORY_CARE_PROVIDER_SITE_OTHER): Payer: BC Managed Care – PPO | Admitting: Obstetrics and Gynecology

## 2023-04-08 ENCOUNTER — Encounter: Payer: Self-pay | Admitting: Obstetrics and Gynecology

## 2023-04-08 VITALS — BP 122/84 | HR 67 | Ht 61.0 in | Wt 173.0 lb

## 2023-04-08 DIAGNOSIS — N760 Acute vaginitis: Secondary | ICD-10-CM | POA: Diagnosis present

## 2023-04-08 DIAGNOSIS — Z01419 Encounter for gynecological examination (general) (routine) without abnormal findings: Secondary | ICD-10-CM

## 2023-04-08 DIAGNOSIS — Z8619 Personal history of other infectious and parasitic diseases: Secondary | ICD-10-CM | POA: Diagnosis not present

## 2023-04-08 MED ORDER — VALACYCLOVIR HCL 1 G PO TABS
ORAL_TABLET | ORAL | 3 refills | Status: AC
Start: 2023-04-08 — End: ?

## 2023-04-08 NOTE — Patient Instructions (Signed)

## 2023-04-09 ENCOUNTER — Other Ambulatory Visit: Payer: Self-pay

## 2023-04-09 DIAGNOSIS — B9689 Other specified bacterial agents as the cause of diseases classified elsewhere: Secondary | ICD-10-CM

## 2023-04-09 LAB — CERVICOVAGINAL ANCILLARY ONLY
Bacterial Vaginitis (gardnerella): POSITIVE — AB
Candida Glabrata: NEGATIVE
Candida Vaginitis: NEGATIVE
Comment: NEGATIVE
Comment: NEGATIVE
Comment: NEGATIVE
Comment: NEGATIVE
Trichomonas: NEGATIVE

## 2023-04-09 MED ORDER — METRONIDAZOLE 0.75 % VA GEL
1.0000 | Freq: Every day | VAGINAL | 0 refills | Status: AC
Start: 2023-04-09 — End: 2023-04-14

## 2023-06-30 ENCOUNTER — Other Ambulatory Visit: Payer: Self-pay | Admitting: Hematology and Oncology

## 2023-10-03 ENCOUNTER — Other Ambulatory Visit: Payer: Self-pay | Admitting: Hematology and Oncology

## 2023-10-10 ENCOUNTER — Inpatient Hospital Stay: Payer: BC Managed Care – PPO | Attending: Hematology and Oncology | Admitting: Hematology and Oncology

## 2023-10-10 VITALS — BP 140/76 | HR 76 | Temp 98.6°F | Resp 17 | Wt 166.7 lb

## 2023-10-10 DIAGNOSIS — E785 Hyperlipidemia, unspecified: Secondary | ICD-10-CM | POA: Diagnosis not present

## 2023-10-10 DIAGNOSIS — Z803 Family history of malignant neoplasm of breast: Secondary | ICD-10-CM | POA: Insufficient documentation

## 2023-10-10 DIAGNOSIS — Z800 Family history of malignant neoplasm of digestive organs: Secondary | ICD-10-CM | POA: Insufficient documentation

## 2023-10-10 DIAGNOSIS — Z79899 Other long term (current) drug therapy: Secondary | ICD-10-CM | POA: Insufficient documentation

## 2023-10-10 DIAGNOSIS — Z87891 Personal history of nicotine dependence: Secondary | ICD-10-CM | POA: Insufficient documentation

## 2023-10-10 DIAGNOSIS — Z9189 Other specified personal risk factors, not elsewhere classified: Secondary | ICD-10-CM | POA: Diagnosis present

## 2023-10-10 DIAGNOSIS — Z801 Family history of malignant neoplasm of trachea, bronchus and lung: Secondary | ICD-10-CM | POA: Insufficient documentation

## 2023-10-10 DIAGNOSIS — Z79811 Long term (current) use of aromatase inhibitors: Secondary | ICD-10-CM | POA: Insufficient documentation

## 2023-10-10 NOTE — Progress Notes (Signed)
[facility] CONSULT NOTE  Patient Care Team: Verlon Au, MD as PCP - General (Family Medicine) Patton Salles, MD as Consulting Physician (Obstetrics and Gynecology)  CHIEF COMPLAINTS/PURPOSE OF CONSULTATION:  High risk breast cancer follow up  ASSESSMENT & PLAN:   This is a very pleasant 62 year old female patient with family history of breast cancer referred to high risk breast cancer clinic for additional recommendations.  During her initial visit we have discussed her 5-year risk of breast cancer and lifetime risk of breast cancer which was 3.6% and 21% respectively.    She is now on Arimidex for breast cancer prevention. With regards to screening, she would like to continue annual MRIs and mammograms.  Most recent mammogram negative for any evidence of malignancy.  MRI now due.  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  This does not change any recommendations at this time.  She was informed to stay in touch periodically with the genetic counselor if the lab determines that these variants are indeed significant in the future.  At high risk for breast cancer No changes in breasts. Mammogram from March was normal. Patient is due for MRI. -Order MRI of breasts. -Order mammogram for March 2025.  Anastrozole Therapy Patient tolerating well with no reported side effects. -Continue Anastrozole daily.  Hyperlipidemia Patient on Repatha injections every two weeks.  Bone Health Discussed potential impact of Anastrozole on bone density and importance of exercise. -Order bone density scan. -Encourage 30 minutes of exercise 5 days a week. -Continue Vitamin D supplementation. -Consider calcium supplementation if diet is not calcium-rich.  Follow-up in one year, or sooner if any changes in breasts are noticed.  HISTORY OF PRESENTING ILLNESS:   Lisa [name] 61 y.o. female is here because of high risk for breast  cancer  This is a very pleasant 62 year old female patient with no significant past medical history except for uncontrolled high blood pressure referred to high risk breast cancer clinic given her family history of breast cancer.  She is very healthy, retired, used to work in [employer], site.  She remembers family history of breast cancer in her mom and her sister, both had cancer in their 30s and had second breast cancer.   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 She was initially referred to discuss role of chemoprevention as well as MRI surveillance given her lifetime risk of breast cancer.  She had 3 kids, age at first pregnancy of 52.  Used oral contraceptives for about 20 years.  No hormone replacement therapy. She is now on anastrozole for breast cancer prevention  Interval History  She is taking anastrazole as prescribed.  The patient, with a history of breast cancer, presents for a routine follow-up. She reports adherence to daily anastrozole without side effects. Her last mammogram in March was unremarkable. She also reports taking Repatha injections every two weeks.. She expresses difficulty self-administering the injections and relies on a friend for assistance. She denies any new health changes or medications. She admits to not exercising regularly, but is willing to work on it  Rest of the pertinent 10 point ROS reviewed and negative.   MEDICAL HISTORY:  Past Medical History:  Diagnosis Date   Abnormal Pap smear of cervix    2020 - AGUS pap, negative HR HPV, colposcopy showing LGSIL on biopsy and ECC   Alopecia    Carpal tunnel syndrome of right wrist  Elevated cholesterol    Family history of adverse reaction to anesthesia    sister has naseau   Family history of pancreatic cancer 06/22/2021   HSV infection    Type I and II   Hypertension    Increased risk of breast cancer 2018   27.7 lifetime risk.  Yearly MRI of breast.    Low back  pain    OAB (overactive bladder)    Pneumonia    Seizures (HCC)     SURGICAL HISTORY: Past Surgical History:  Procedure Laterality Date   ABDOMINAL HYSTERECTOMY  06/23/2004   supracervical hysterectomy.  Ovaries remain.   CESAREAN SECTION     CRYOTHERAPY     ECTOPIC PREGNANCY SURGERY     LUMBAR LAMINECTOMY/DECOMPRESSION MICRODISCECTOMY Left 05/07/2017   Procedure: Decompression L4-L5 and excision of synovial cyst Left;  Surgeon: Ranee Gosselin, MD;  Location: [facility];  Service: Orthopedics;  Laterality: Left;   TRIGGER FINGER RELEASE      SOCIAL HISTORY: Social History   Socioeconomic History   Marital status: Widowed    Spouse name: Not on file   Number of children: Not on file   Years of education: Not on file   Highest education level: Not on file  Occupational History   Not on file  Tobacco Use   Smoking status: Former    Current packs/day: 0.00    Types: Cigarettes    Quit date: 12/31/1996    Years since quitting: 26.7   Smokeless tobacco: Never  Vaping Use   Vaping status: Never Used  Substance and Sexual Activity   Alcohol use: Yes    Alcohol/week: 1.0 - 2.0 standard drink of alcohol    Types: 1 - 2 Standard drinks or equivalent per week   Drug use: No   Sexual activity: Yes    Birth control/protection: Surgical, Post-menopausal, Condom    Comment: hysterectomy  Other Topics Concern   Not on file  Social History Narrative   Not on file   Social Determinants of Health   Financial Resource Strain: Not on file  Food Insecurity: Low Risk  (06/20/2023)   Received from [facility]   Hunger Vital Sign    Worried About Running Out of Food in the Last Year: Never true    Ran Out of Food in the Last Year: Never true  Transportation Needs: No Transportation Needs (06/20/2023)   Received from [facility]    In the past 12 months, has lack of reliable transportation kept you from medical appointments, meetings, work or from getting things needed  for daily living? : No  Physical Activity: Not on file  Stress: Not on file  Social Connections: Not on file  Intimate Partner Violence: Not on file    FAMILY HISTORY: Family History  Problem Relation Age of Onset   Cancer Mother 23       vaginal cancer   Breast cancer Mother 63       second primary at age 71   Hypertension Mother    Lung cancer Father 20       smoking hx   Diabetes Father    Hypertension Father    Breast cancer Sister 10       recurrence 10 yrs later and bilateral mastectomies   Hypertension Sister    Breast cancer Maternal Aunt        dx before 48, x4   Breast cancer Maternal Aunt        dx  after 50   Pancreatic cancer Maternal Aunt    Kidney cancer Maternal Grandmother        dx 56s   Breast cancer Cousin        several female maternal cousins with premenopausal breast cancer    ALLERGIES:  has No Known Allergies.  MEDICATIONS:  Current Outpatient Medications  Medication Sig Dispense Refill   anastrozole (ARIMIDEX) 1 MG tablet TAKE 1 TABLET(1 MG) BY MOUTH DAILY 90 tablet 0   cetirizine (ZYRTEC) 10 MG tablet Take 1 tablet (10 mg total) by mouth daily. 90 tablet 3   Cholecalciferol (VITAMIN D) 50 MCG (2000 UT) CAPS Take 1 capsule (2,000 Units total) by mouth daily. 30 capsule    clobetasol cream (TEMOVATE) 0.05 % Once daily three times a week 30 g 1   Dentifrices (SENSITIVE TOOTHPASTE/FLUORIDE) 5-0.243 % PSTE Use to brush teeth 2x daily. 300 g 3   finasteride (PROSCAR) 5 MG tablet Take 1 tablet (5 mg total) by mouth daily. 90 tablet 1   hydrochlorothiazide (HYDRODIURIL) 25 MG tablet Take 1/2 tablet once a day 45 tablet 3   LORazepam (ATIVAN) 0.5 MG tablet Take 1 tablet (0.5 mg total) by mouth 2 (two) times daily as needed for anxiety. TAKE 1 TABLET(0.5 MG) BY MOUTH TWICE DAILY AS NEEDED FOR ANXIETY 30 tablet 0   methocarbamol (ROBAXIN) 500 MG tablet Take 1 tablet (500 mg total) by mouth every 6 (six) hours as needed for muscle spasms. 180 tablet 0    nystatin powder Apply to affected areas three times a day as needed 60 g 2   olopatadine (PATANOL) 0.1 % ophthalmic solution Place 1 drop into both eyes 2 (two) times daily. 15 mL 3   phenytoin (DILANTIN) 100 MG ER capsule Take 2 capsules (200 mg total) by mouth every evening. 180 capsule 3   REPATHA 140 MG/ML SOSY Inject into the skin.     scopolamine (TRANSDERM-SCOP, 1.5 MG,) 1 MG/3DAYS Place 1 patch (1.5 mg total) onto the skin every 3 (three) days. 10 patch 1   Sod Fluoride-Potassium Nitrate (PREVIDENT 5000 SENSITIVE) 1.1-5 % PSTE PreviDent 5000 Sensitive 1.1 %-5 % dental paste 100 mL 5   traZODone (DESYREL) 50 MG tablet Take 0.5-1 tablets (25-50 mg total) by mouth at bedtime as needed for sleep. 30 tablet 3   valACYclovir (VALTREX) 1000 MG tablet (1/2) tablet daily for suppression- may increase to twice daily x3 days at onset of outbreak 45 tablet 3   vitamin C (ASCORBIC ACID) 500 MG tablet Take 2 tablets (1,000 mg total) by mouth daily.     No current facility-administered medications for this visit.   PHYSICAL EXAMINATION: ECOG PERFORMANCE STATUS: 0 - Asymptomatic  Vitals:   10/10/23 0910  BP: (!) 140/76  Pulse: 76  Resp: 17  Temp: 98.6 F (37 C)  SpO2: 98%     Filed Weights   10/10/23 0910  Weight: 166 lb 11.2 oz (75.6 kg)    Physical Exam Constitutional:      Appearance: Normal appearance.  Cardiovascular:     Rate and Rhythm: Normal rate and regular rhythm.     Pulses: Normal pulses.     Heart sounds: Normal heart sounds.  Chest:     Comments: Bilateral breast examined.  No palpable masses or regional adenopathy Abdominal:     General: Abdomen is flat. Bowel sounds are normal.  Musculoskeletal:     Cervical back: Normal range of motion and neck supple. No rigidity.  Lymphadenopathy:  Cervical: No cervical adenopathy.  Neurological:     General: No focal deficit present.     Mental Status: She is alert.  Psychiatric:        Mood and Affect: Mood normal.       LABORATORY DATA:  I have reviewed the data as listed Lab Results  Component Value Date   WBC 5.1 03/07/2021   HGB 13.5 03/07/2021   HCT 40.1 03/07/2021   MCV 87.0 03/07/2021   PLT 256 03/07/2021     Chemistry      Component Value Date/Time   NA 138 03/07/2021 0959   NA 138 06/03/2012 0000   K 4.2 03/07/2021 0959   CL 103 03/07/2021 0959   CO2 22 03/07/2021 0959   BUN 15 03/07/2021 0959   BUN 13 03/23/2013 0000   CREATININE 0.96 03/07/2021 0959   GLU 89 03/23/2013 0000      Component Value Date/Time   CALCIUM 9.2 03/07/2021 0959   ALKPHOS 95 06/03/2017 0913   AST 19 03/07/2021 0959   ALT 14 03/07/2021 0959   BILITOT 0.3 03/07/2021 0959     I have reviewed pertinent mammogram and MRI results.  Have reviewed pertinent genetic testing results and risk scores based on models calculated.  Lifetime risk of breast cancer per TC model is 21%. 5-year risk of breast cancer per Dondra Spry model is 3.6%.  RADIOGRAPHIC STUDIES: I have personally reviewed the radiological images as listed and agreed with the findings in the report. No results found.    Rachel Moulds, MD 10/10/2023 9:22 AM

## 2023-10-19 ENCOUNTER — Other Ambulatory Visit (HOSPITAL_COMMUNITY): Payer: Self-pay

## 2023-11-01 ENCOUNTER — Other Ambulatory Visit (HOSPITAL_COMMUNITY): Payer: Self-pay

## 2023-11-04 ENCOUNTER — Other Ambulatory Visit (HOSPITAL_COMMUNITY): Payer: Self-pay

## 2023-11-04 MED ORDER — REPATHA PUSHTRONEX SYSTEM 420 MG/3.5ML ~~LOC~~ SOCT
420.0000 mg | SUBCUTANEOUS | 3 refills | Status: DC
  Filled 2023-11-04 (×2): qty 3.5, 30d supply, fill #0
  Filled 2023-12-21 (×2): qty 3.5, 30d supply, fill #1
  Filled 2024-01-23: qty 3.5, 30d supply, fill #2

## 2023-11-08 ENCOUNTER — Other Ambulatory Visit (HOSPITAL_COMMUNITY): Payer: Self-pay

## 2023-11-08 MED ORDER — HYDROCHLOROTHIAZIDE 25 MG PO TABS
25.0000 mg | ORAL_TABLET | Freq: Every day | ORAL | 3 refills | Status: DC
  Filled 2023-12-21 (×2): qty 90, 90d supply, fill #0
  Filled 2024-03-06: qty 90, 90d supply, fill #1
  Filled 2024-03-28 – 2024-07-17 (×6): qty 90, 90d supply, fill #2

## 2023-11-08 MED ORDER — VALACYCLOVIR HCL 1 G PO TABS
500.0000 mg | ORAL_TABLET | Freq: Every day | ORAL | 11 refills | Status: AC
  Filled 2023-12-21: qty 60, 90d supply, fill #0
  Filled 2023-12-21: qty 60, 30d supply, fill #0
  Filled 2024-03-15: qty 60, 90d supply, fill #1
  Filled 2024-03-28 – 2024-04-13 (×3): qty 60, 90d supply, fill #2

## 2023-11-08 MED ORDER — TRAZODONE HCL 50 MG PO TABS: 50.0000 mg | ORAL_TABLET | Freq: Every day | ORAL | 5 refills | Status: AC

## 2023-11-08 MED ORDER — REPATHA 140 MG/ML ~~LOC~~ SOSY: 140.0000 mg | PREFILLED_SYRINGE | SUBCUTANEOUS | 11 refills | Status: DC

## 2023-11-08 MED ORDER — FLUOROMETHOLONE 0.1 % OP SUSP: 1.0000 [drp] | Freq: Three times a day (TID) | OPHTHALMIC | 2 refills | Status: AC

## 2023-11-11 ENCOUNTER — Other Ambulatory Visit (HOSPITAL_COMMUNITY): Payer: Self-pay

## 2023-11-11 MED ORDER — CLOBETASOL PROPIONATE 0.05 % EX OINT
TOPICAL_OINTMENT | CUTANEOUS | 1 refills | Status: DC
  Filled 2023-11-11: qty 60, 10d supply, fill #0
  Filled 2023-12-21 (×2): qty 60, 10d supply, fill #1
  Filled 2024-02-17: qty 60, 30d supply, fill #2
  Filled 2024-04-13: qty 60, 30d supply, fill #3
  Filled 2024-09-18: qty 120, 30d supply, fill #4

## 2023-11-13 ENCOUNTER — Encounter: Payer: Self-pay | Admitting: Hematology and Oncology

## 2023-11-15 ENCOUNTER — Other Ambulatory Visit (HOSPITAL_COMMUNITY): Payer: Self-pay

## 2023-11-15 ENCOUNTER — Encounter: Payer: Self-pay | Admitting: Hematology and Oncology

## 2023-11-19 ENCOUNTER — Other Ambulatory Visit (HOSPITAL_COMMUNITY): Payer: Self-pay

## 2023-11-20 ENCOUNTER — Ambulatory Visit
Admission: RE | Admit: 2023-11-20 | Discharge: 2023-11-20 | Disposition: A | Discharge status: Home / Self Care | Payer: BC Managed Care – PPO | Source: Ambulatory Visit | Attending: Hematology and Oncology | Admitting: Hematology and Oncology

## 2023-11-20 DIAGNOSIS — Z9189 Other specified personal risk factors, not elsewhere classified: Secondary | ICD-10-CM

## 2023-11-20 MED ORDER — GADOPICLENOL 0.5 MMOL/ML IV SOLN
7.0000 mL | Freq: Once | INTRAVENOUS | Status: AC | PRN
  Administered 2023-11-20: 7 mL via INTRAVENOUS

## 2023-12-03 ENCOUNTER — Other Ambulatory Visit (HOSPITAL_COMMUNITY): Payer: Self-pay

## 2023-12-03 MED ORDER — HYDROCOD POLI-CHLORPHE POLI ER 10-8 MG/5ML PO SUER
5.0000 mL | Freq: Two times a day (BID) | ORAL | 0 refills | Status: AC | PRN
  Filled 2023-12-03: qty 100, 10d supply, fill #0

## 2023-12-11 ENCOUNTER — Encounter: Payer: Self-pay | Admitting: Genetic Counselor

## 2023-12-11 NOTE — Progress Notes (Signed)
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

## 2023-12-21 ENCOUNTER — Other Ambulatory Visit (HOSPITAL_COMMUNITY): Payer: Self-pay

## 2023-12-26 ENCOUNTER — Other Ambulatory Visit (HOSPITAL_COMMUNITY): Payer: Self-pay

## 2023-12-30 ENCOUNTER — Other Ambulatory Visit: Payer: Self-pay | Admitting: Hematology and Oncology

## 2024-01-06 ENCOUNTER — Other Ambulatory Visit (HOSPITAL_COMMUNITY): Payer: Self-pay

## 2024-01-08 ENCOUNTER — Other Ambulatory Visit (HOSPITAL_COMMUNITY): Payer: Self-pay

## 2024-01-09 ENCOUNTER — Other Ambulatory Visit (HOSPITAL_COMMUNITY): Payer: Self-pay

## 2024-01-09 MED FILL — Anastrozole Tab 1 MG: ORAL | 90 days supply | Qty: 90 | Fill #0 | Status: AC

## 2024-01-10 ENCOUNTER — Other Ambulatory Visit (HOSPITAL_COMMUNITY): Payer: Self-pay

## 2024-01-10 MED ORDER — METHYLPREDNISOLONE 4 MG PO TBPK
ORAL_TABLET | ORAL | 0 refills | Status: AC
  Filled 2024-01-10: qty 21, 6d supply, fill #0

## 2024-01-10 MED ORDER — GUAIFENESIN-CODEINE 100-10 MG/5ML PO SOLN
5.0000 mL | Freq: Three times a day (TID) | ORAL | 0 refills | Status: AC | PRN
  Filled 2024-01-10: qty 120, 8d supply, fill #0

## 2024-01-10 MED ORDER — AMOXICILLIN-POT CLAVULANATE 875-125 MG PO TABS
1.0000 | ORAL_TABLET | Freq: Two times a day (BID) | ORAL | 0 refills | Status: AC
  Filled 2024-01-10: qty 20, 10d supply, fill #0

## 2024-01-23 ENCOUNTER — Other Ambulatory Visit (HOSPITAL_COMMUNITY): Payer: Self-pay

## 2024-01-24 ENCOUNTER — Other Ambulatory Visit (HOSPITAL_COMMUNITY): Payer: Self-pay

## 2024-01-27 ENCOUNTER — Other Ambulatory Visit (HOSPITAL_COMMUNITY): Payer: Self-pay

## 2024-01-30 ENCOUNTER — Other Ambulatory Visit (HOSPITAL_COMMUNITY): Payer: Self-pay

## 2024-01-31 ENCOUNTER — Other Ambulatory Visit (HOSPITAL_COMMUNITY): Payer: Self-pay

## 2024-02-03 ENCOUNTER — Other Ambulatory Visit (HOSPITAL_COMMUNITY): Payer: Self-pay

## 2024-02-04 ENCOUNTER — Other Ambulatory Visit (HOSPITAL_COMMUNITY): Payer: Self-pay

## 2024-02-04 MED ORDER — PHENYTOIN SODIUM EXTENDED 100 MG PO CAPS
200.0000 mg | ORAL_CAPSULE | Freq: Every day | ORAL | 3 refills | Status: AC
  Filled 2024-02-04 (×2): qty 180, 90d supply, fill #0
  Filled 2024-08-02: qty 180, 90d supply, fill #1
  Filled 2024-11-23: qty 180, 90d supply, fill #2
  Filled 2025-01-21: qty 180, 90d supply, fill #3

## 2024-02-05 ENCOUNTER — Other Ambulatory Visit: Payer: Self-pay

## 2024-02-05 ENCOUNTER — Other Ambulatory Visit (HOSPITAL_COMMUNITY): Payer: Self-pay

## 2024-02-06 ENCOUNTER — Other Ambulatory Visit (HOSPITAL_COMMUNITY): Payer: Self-pay

## 2024-02-07 ENCOUNTER — Other Ambulatory Visit (HOSPITAL_COMMUNITY): Payer: Self-pay

## 2024-02-10 ENCOUNTER — Other Ambulatory Visit (HOSPITAL_COMMUNITY): Payer: Self-pay

## 2024-02-10 MED ORDER — FINASTERIDE 5 MG PO TABS
5.0000 mg | ORAL_TABLET | Freq: Every day | ORAL | 0 refills | Status: DC
  Filled 2024-02-10: qty 90, 90d supply, fill #0

## 2024-02-11 ENCOUNTER — Other Ambulatory Visit (HOSPITAL_COMMUNITY): Payer: Self-pay

## 2024-02-17 ENCOUNTER — Other Ambulatory Visit (HOSPITAL_COMMUNITY): Payer: Self-pay

## 2024-02-27 ENCOUNTER — Other Ambulatory Visit (HOSPITAL_COMMUNITY): Payer: Self-pay

## 2024-02-28 ENCOUNTER — Other Ambulatory Visit (HOSPITAL_COMMUNITY): Payer: Self-pay

## 2024-03-02 ENCOUNTER — Other Ambulatory Visit (HOSPITAL_COMMUNITY): Payer: Self-pay

## 2024-03-03 ENCOUNTER — Other Ambulatory Visit (HOSPITAL_COMMUNITY): Payer: Self-pay

## 2024-03-04 ENCOUNTER — Other Ambulatory Visit (HOSPITAL_COMMUNITY): Payer: Self-pay

## 2024-03-05 ENCOUNTER — Other Ambulatory Visit (HOSPITAL_COMMUNITY): Payer: Self-pay

## 2024-03-06 ENCOUNTER — Other Ambulatory Visit (HOSPITAL_COMMUNITY): Payer: Self-pay

## 2024-03-09 ENCOUNTER — Other Ambulatory Visit (HOSPITAL_COMMUNITY): Payer: Self-pay

## 2024-03-11 ENCOUNTER — Other Ambulatory Visit (HOSPITAL_COMMUNITY): Payer: Self-pay

## 2024-03-12 ENCOUNTER — Other Ambulatory Visit (HOSPITAL_COMMUNITY): Payer: Self-pay

## 2024-03-12 MED ORDER — REPATHA SURECLICK 140 MG/ML ~~LOC~~ SOAJ
140.0000 mg | SUBCUTANEOUS | 3 refills | Status: AC
  Filled 2024-03-12 – 2024-03-13 (×3): qty 2, 28d supply, fill #0
  Filled 2024-03-28 – 2024-04-13 (×2): qty 2, 28d supply, fill #1
  Filled 2024-05-08: qty 2, 28d supply, fill #2
  Filled 2024-06-05 – 2024-06-16 (×2): qty 2, 28d supply, fill #3
  Filled 2024-07-06: qty 2, 28d supply, fill #4
  Filled 2024-07-30: qty 2, 28d supply, fill #5
  Filled 2024-08-16 – 2024-08-20 (×2): qty 2, 28d supply, fill #6
  Filled 2024-09-27: qty 2, 28d supply, fill #7
  Filled 2024-10-23: qty 2, 28d supply, fill #8
  Filled 2024-11-14: qty 2, 28d supply, fill #9
  Filled 2024-12-30: qty 2, 28d supply, fill #10
  Filled 2025-01-21: qty 2, 28d supply, fill #11

## 2024-03-13 ENCOUNTER — Other Ambulatory Visit: Payer: Self-pay

## 2024-03-13 ENCOUNTER — Other Ambulatory Visit (HOSPITAL_COMMUNITY): Payer: Self-pay

## 2024-03-17 ENCOUNTER — Other Ambulatory Visit (HOSPITAL_COMMUNITY): Payer: Self-pay

## 2024-03-17 ENCOUNTER — Ambulatory Visit
Admission: RE | Admit: 2024-03-17 | Discharge: 2024-03-17 | Disposition: A | Discharge status: Home / Self Care | Source: Ambulatory Visit | Attending: Hematology and Oncology

## 2024-03-17 DIAGNOSIS — Z9189 Other specified personal risk factors, not elsewhere classified: Secondary | ICD-10-CM

## 2024-03-18 ENCOUNTER — Ambulatory Visit

## 2024-03-19 ENCOUNTER — Other Ambulatory Visit (HOSPITAL_COMMUNITY): Payer: Self-pay

## 2024-03-25 ENCOUNTER — Other Ambulatory Visit (HOSPITAL_COMMUNITY): Payer: Self-pay

## 2024-03-28 ENCOUNTER — Other Ambulatory Visit (HOSPITAL_BASED_OUTPATIENT_CLINIC_OR_DEPARTMENT_OTHER): Payer: Self-pay

## 2024-03-28 ENCOUNTER — Other Ambulatory Visit (HOSPITAL_COMMUNITY): Payer: Self-pay

## 2024-03-30 ENCOUNTER — Other Ambulatory Visit (HOSPITAL_COMMUNITY): Payer: Self-pay

## 2024-03-30 MED ORDER — FINASTERIDE 5 MG PO TABS
5.0000 mg | ORAL_TABLET | Freq: Every day | ORAL | 2 refills | Status: DC
  Filled 2024-04-11 – 2024-05-04 (×2): qty 90, 90d supply, fill #0
  Filled 2024-07-13 – 2024-08-02 (×2): qty 90, 90d supply, fill #1
  Filled 2024-08-16 – 2024-10-23 (×4): qty 90, 90d supply, fill #2

## 2024-04-03 ENCOUNTER — Other Ambulatory Visit (HOSPITAL_COMMUNITY): Payer: Self-pay

## 2024-04-13 ENCOUNTER — Other Ambulatory Visit (HOSPITAL_COMMUNITY): Payer: Self-pay

## 2024-04-14 ENCOUNTER — Other Ambulatory Visit: Payer: Self-pay

## 2024-04-14 ENCOUNTER — Other Ambulatory Visit (HOSPITAL_COMMUNITY): Payer: Self-pay

## 2024-04-15 ENCOUNTER — Other Ambulatory Visit (HOSPITAL_COMMUNITY): Payer: Self-pay

## 2024-04-16 ENCOUNTER — Other Ambulatory Visit (HOSPITAL_COMMUNITY): Payer: Self-pay

## 2024-04-18 ENCOUNTER — Other Ambulatory Visit (HOSPITAL_COMMUNITY): Payer: Self-pay

## 2024-05-04 ENCOUNTER — Other Ambulatory Visit (HOSPITAL_COMMUNITY): Payer: Self-pay

## 2024-05-06 ENCOUNTER — Other Ambulatory Visit (HOSPITAL_COMMUNITY): Payer: Self-pay

## 2024-05-08 ENCOUNTER — Other Ambulatory Visit (HOSPITAL_COMMUNITY): Payer: Self-pay

## 2024-05-14 ENCOUNTER — Ambulatory Visit: Payer: Self-pay | Admitting: *Deleted

## 2024-05-14 ENCOUNTER — Ambulatory Visit
Admission: RE | Admit: 2024-05-14 | Discharge: 2024-05-14 | Disposition: A | Discharge status: Home / Self Care | Payer: BC Managed Care – PPO | Source: Ambulatory Visit | Attending: Hematology and Oncology | Admitting: Hematology and Oncology

## 2024-05-14 ENCOUNTER — Other Ambulatory Visit: Payer: Self-pay | Admitting: *Deleted

## 2024-05-14 ENCOUNTER — Other Ambulatory Visit (HOSPITAL_COMMUNITY): Payer: Self-pay

## 2024-05-14 DIAGNOSIS — Z9189 Other specified personal risk factors, not elsewhere classified: Secondary | ICD-10-CM

## 2024-05-14 MED ORDER — ANASTROZOLE 1 MG PO TABS
1.0000 mg | ORAL_TABLET | Freq: Every day | ORAL | 0 refills | Status: DC
  Filled 2024-05-14: qty 90, 90d supply, fill #0

## 2024-05-14 NOTE — Telephone Encounter (Signed)
-----   Message from Conway Dennis sent at 05/14/2024 12:04 PM EDT ----- Bone density normal.  Please give patient results. ----- Message ----- From: Interface, Rad Results In Sent: 05/14/2024   8:54 AM EDT To: Murleen Arms, MD

## 2024-05-14 NOTE — Telephone Encounter (Signed)
Per Lindsey Causey, NP, called pt with message below. Pt verbalized understanding.  

## 2024-06-11 ENCOUNTER — Other Ambulatory Visit (HOSPITAL_COMMUNITY): Payer: Self-pay

## 2024-06-15 ENCOUNTER — Other Ambulatory Visit (HOSPITAL_COMMUNITY): Payer: Self-pay

## 2024-06-16 ENCOUNTER — Other Ambulatory Visit (HOSPITAL_COMMUNITY): Payer: Self-pay

## 2024-06-19 ENCOUNTER — Other Ambulatory Visit (HOSPITAL_COMMUNITY): Payer: Self-pay

## 2024-06-19 MED ORDER — NAPROXEN 500 MG PO TABS
500.0000 mg | ORAL_TABLET | Freq: Two times a day (BID) | ORAL | 5 refills | Status: AC
  Filled 2024-06-19 – 2024-06-25 (×2): qty 60, 30d supply, fill #0
  Filled 2024-08-02: qty 60, 30d supply, fill #1
  Filled 2024-08-16 – 2024-09-18 (×5): qty 60, 30d supply, fill #2
  Filled 2024-12-30: qty 60, 30d supply, fill #3

## 2024-06-19 MED ORDER — SCOPOLAMINE 1 MG/3DAYS TD PT72
1.0000 | MEDICATED_PATCH | TRANSDERMAL | 0 refills | Status: DC | PRN
  Filled 2024-06-19 – 2024-06-25 (×2): qty 4, 12d supply, fill #0

## 2024-06-25 ENCOUNTER — Other Ambulatory Visit (HOSPITAL_COMMUNITY): Payer: Self-pay

## 2024-06-26 ENCOUNTER — Other Ambulatory Visit (HOSPITAL_COMMUNITY): Payer: Self-pay

## 2024-06-26 ENCOUNTER — Other Ambulatory Visit: Payer: Self-pay | Admitting: Medical Genetics

## 2024-06-29 ENCOUNTER — Other Ambulatory Visit (HOSPITAL_COMMUNITY): Payer: Self-pay

## 2024-07-07 ENCOUNTER — Other Ambulatory Visit (HOSPITAL_COMMUNITY): Payer: Self-pay

## 2024-07-09 ENCOUNTER — Other Ambulatory Visit (HOSPITAL_COMMUNITY): Payer: Self-pay

## 2024-07-14 ENCOUNTER — Other Ambulatory Visit: Payer: Self-pay

## 2024-07-14 ENCOUNTER — Other Ambulatory Visit (HOSPITAL_COMMUNITY): Payer: Self-pay

## 2024-07-14 ENCOUNTER — Encounter: Payer: Self-pay | Admitting: Pharmacist

## 2024-07-17 ENCOUNTER — Other Ambulatory Visit: Payer: Self-pay

## 2024-07-17 ENCOUNTER — Other Ambulatory Visit (HOSPITAL_COMMUNITY): Payer: Self-pay

## 2024-07-21 ENCOUNTER — Other Ambulatory Visit (HOSPITAL_COMMUNITY)
Admission: RE | Admit: 2024-07-21 | Discharge: 2024-07-21 | Disposition: A | Discharge status: Home / Self Care | Payer: Self-pay | Source: Ambulatory Visit | Attending: Medical Genetics | Admitting: Medical Genetics

## 2024-07-30 ENCOUNTER — Other Ambulatory Visit (HOSPITAL_COMMUNITY): Payer: Self-pay

## 2024-07-31 LAB — GENECONNECT MOLECULAR SCREEN: Genetic Analysis Overall Interpretation: NEGATIVE

## 2024-08-03 ENCOUNTER — Other Ambulatory Visit (HOSPITAL_COMMUNITY): Payer: Self-pay

## 2024-08-17 ENCOUNTER — Other Ambulatory Visit (HOSPITAL_COMMUNITY): Payer: Self-pay

## 2024-08-20 ENCOUNTER — Other Ambulatory Visit (HOSPITAL_COMMUNITY): Payer: Self-pay

## 2024-09-09 ENCOUNTER — Other Ambulatory Visit (HOSPITAL_COMMUNITY): Payer: Self-pay

## 2024-09-17 ENCOUNTER — Other Ambulatory Visit (HOSPITAL_COMMUNITY): Payer: Self-pay

## 2024-09-17 ENCOUNTER — Other Ambulatory Visit: Payer: Self-pay

## 2024-09-17 MED ORDER — SCOPOLAMINE 1 MG/3DAYS TD PT72
1.0000 | MEDICATED_PATCH | TRANSDERMAL | 0 refills | Status: DC
  Filled 2024-09-17: qty 4, 12d supply, fill #0

## 2024-09-18 ENCOUNTER — Other Ambulatory Visit: Payer: Self-pay

## 2024-09-18 ENCOUNTER — Other Ambulatory Visit (HOSPITAL_COMMUNITY): Payer: Self-pay

## 2024-09-22 ENCOUNTER — Other Ambulatory Visit (HOSPITAL_COMMUNITY): Payer: Self-pay

## 2024-09-22 MED ORDER — FLUZONE 0.5 ML IM SUSY
0.5000 mL | PREFILLED_SYRINGE | Freq: Once | INTRAMUSCULAR | 0 refills | Status: AC
  Filled 2024-09-22: qty 0.5, 1d supply, fill #0

## 2024-09-22 MED ORDER — COVID-19 MRNA VAC-TRIS(PFIZER) 30 MCG/0.3ML IM SUSY
0.3000 mL | PREFILLED_SYRINGE | Freq: Once | INTRAMUSCULAR | 0 refills | Status: AC
  Filled 2024-09-22: qty 0.3, 1d supply, fill #0

## 2024-09-24 ENCOUNTER — Other Ambulatory Visit (HOSPITAL_COMMUNITY): Payer: Self-pay

## 2024-09-27 ENCOUNTER — Other Ambulatory Visit (HOSPITAL_COMMUNITY): Payer: Self-pay

## 2024-09-28 ENCOUNTER — Other Ambulatory Visit: Payer: Self-pay

## 2024-09-28 ENCOUNTER — Other Ambulatory Visit (HOSPITAL_COMMUNITY): Payer: Self-pay

## 2024-09-28 MED ORDER — HYDROCHLOROTHIAZIDE 25 MG PO TABS
25.0000 mg | ORAL_TABLET | Freq: Every day | ORAL | 0 refills | Status: AC
  Filled 2024-09-28: qty 90, 90d supply, fill #0

## 2024-09-29 ENCOUNTER — Other Ambulatory Visit (HOSPITAL_COMMUNITY): Payer: Self-pay

## 2024-09-30 ENCOUNTER — Other Ambulatory Visit (HOSPITAL_COMMUNITY): Payer: Self-pay

## 2024-10-01 ENCOUNTER — Other Ambulatory Visit (HOSPITAL_COMMUNITY): Payer: Self-pay

## 2024-10-05 ENCOUNTER — Other Ambulatory Visit (HOSPITAL_COMMUNITY): Payer: Self-pay

## 2024-10-12 ENCOUNTER — Inpatient Hospital Stay: Payer: BC Managed Care – PPO | Admitting: Hematology and Oncology

## 2024-10-19 ENCOUNTER — Telehealth: Payer: Self-pay

## 2024-10-19 NOTE — Telephone Encounter (Signed)
 Spoke with patient and confirmed appointment on 10/21.SABRASABRA

## 2024-10-20 ENCOUNTER — Inpatient Hospital Stay: Attending: Hematology and Oncology | Admitting: Hematology and Oncology

## 2024-10-20 ENCOUNTER — Other Ambulatory Visit: Payer: Self-pay

## 2024-10-20 VITALS — BP 118/71 | HR 76 | Temp 98.3°F | Resp 16 | Ht 61.0 in | Wt 153.0 lb

## 2024-10-20 DIAGNOSIS — Z8051 Family history of malignant neoplasm of kidney: Secondary | ICD-10-CM | POA: Diagnosis not present

## 2024-10-20 DIAGNOSIS — E669 Obesity, unspecified: Secondary | ICD-10-CM | POA: Insufficient documentation

## 2024-10-20 DIAGNOSIS — Z803 Family history of malignant neoplasm of breast: Secondary | ICD-10-CM | POA: Insufficient documentation

## 2024-10-20 DIAGNOSIS — Z79811 Long term (current) use of aromatase inhibitors: Secondary | ICD-10-CM | POA: Diagnosis not present

## 2024-10-20 DIAGNOSIS — Z87891 Personal history of nicotine dependence: Secondary | ICD-10-CM | POA: Insufficient documentation

## 2024-10-20 DIAGNOSIS — Z9189 Other specified personal risk factors, not elsewhere classified: Secondary | ICD-10-CM

## 2024-10-20 DIAGNOSIS — Z800 Family history of malignant neoplasm of digestive organs: Secondary | ICD-10-CM | POA: Insufficient documentation

## 2024-10-20 DIAGNOSIS — I10 Essential (primary) hypertension: Secondary | ICD-10-CM | POA: Diagnosis not present

## 2024-10-20 DIAGNOSIS — C50919 Malignant neoplasm of unspecified site of unspecified female breast: Secondary | ICD-10-CM | POA: Diagnosis present

## 2024-10-20 DIAGNOSIS — Z808 Family history of malignant neoplasm of other organs or systems: Secondary | ICD-10-CM | POA: Diagnosis not present

## 2024-10-20 DIAGNOSIS — Z801 Family history of malignant neoplasm of trachea, bronchus and lung: Secondary | ICD-10-CM | POA: Diagnosis not present

## 2024-10-20 DIAGNOSIS — Z87898 Personal history of other specified conditions: Secondary | ICD-10-CM | POA: Diagnosis not present

## 2024-10-20 MED ORDER — SCOPOLAMINE 1 MG/3DAYS TD PT72
1.0000 | MEDICATED_PATCH | TRANSDERMAL | 0 refills | Status: AC
  Filled 2024-10-20: qty 15, 45d supply, fill #0

## 2024-10-20 MED ORDER — ANASTROZOLE 1 MG PO TABS
1.0000 mg | ORAL_TABLET | Freq: Every day | ORAL | 3 refills | Status: AC
  Filled 2024-10-20: qty 90, 90d supply, fill #0

## 2024-10-20 NOTE — Progress Notes (Signed)
 [facility] CONSULT NOTE  Patient Care Team: Jolee Madelin Patch, MD as PCP - General (Family Medicine) Cathlyn JAYSON Nikki Bobie FORBES, MD as Consulting Physician (Obstetrics and Gynecology)  CHIEF COMPLAINTS/PURPOSE OF CONSULTATION:  High risk breast cancer follow up  ASSESSMENT & PLAN:   This is a very pleasant 63 year old female patient with family history of breast cancer referred to high risk breast cancer clinic for additional recommendations. During her initial visit we have discussed her 5-year risk of breast cancer and lifetime risk of breast cancer which was 3.6% and 21% respectively.   She is now on Arimidex  for breast cancer prevention. 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   Assessment and Plan Assessment & Plan Breast cancer On anastrozole  without side effects.  No concerns on exam today. Bilateral breasts inspected and palpated. No palpable masses. No regional adenopathy - Order MRI breast in November. - Order mammogram in March. - Continue anastrozole .  Hyperlipidemia Controlled with biweekly Repatha .  Obesity Significant weight loss in study, likely on phentermine.  Motion sickness Controlled with scopolamine  patches during cruises. - Prescribe 15 scopolamine  patches for 40-day cruise.   HISTORY OF PRESENTING ILLNESS:   Lisa [name] 63 y.o. female is here because of high risk for breast cancer  This is a very pleasant 63 year old female patient with no significant past medical history except for uncontrolled high blood pressure referred to high risk breast cancer clinic given her family history of breast cancer.  She is very healthy, retired, used to work in [employer], site.  She remembers family history of breast cancer in her mom and her sister, both had cancer in their 30s and had second breast cancer.   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 She was initially referred to discuss  role of chemoprevention as well as MRI surveillance given her lifetime risk of breast cancer.  She had 3 kids, age at first pregnancy of 81.  Used oral contraceptives for about 20 years.  No hormone replacement therapy. She is now on anastrozole  for breast cancer prevention  History of Present Illness Lisa [name] is a 63 year old female who presents for routine follow-up.  She continues to take anastrozole  without any issues and is following a schedule of alternating mammograms and MRIs, with the next MRI due in November. She had a bone density scan in May. She reports no issues with anastrozole .  She is participating in a weight loss study and believes she may be on phentermine, as she has lost about 30 pounds since starting the study. She is not currently exercising but is pleased with the weight loss.  She is on Repatha  for hyperlipidemia, which she takes every two weeks, although the frequency was changed to once a month at one point. She receives her medications from [facility].  She requested a prescription for scopolamine  transdermal patches for an upcoming 40-day cruise, as she experiences motion sickness during transatlantic crossings. No side effects from the patches.  Rest of the pertinent 10 point ROS reviewed and negative.   MEDICAL HISTORY:  Past Medical History:  Diagnosis Date   Abnormal Pap smear of cervix    2020 - AGUS pap, negative HR HPV, colposcopy showing LGSIL on biopsy and ECC   Alopecia    Carpal tunnel syndrome of right wrist    Elevated cholesterol    Family history of adverse reaction to anesthesia  sister has naseau   Family history of pancreatic cancer 06/22/2021   HSV infection    Type I and II   Hypertension    Increased risk of breast cancer 2018   27.7 lifetime risk.  Yearly MRI of breast.    Low back pain    OAB (overactive bladder)    Pneumonia    Seizures (HCC)     SURGICAL HISTORY: Past Surgical History:   Procedure Laterality Date   ABDOMINAL HYSTERECTOMY  06/23/2004   supracervical hysterectomy.  Ovaries remain.   CESAREAN SECTION     CRYOTHERAPY     ECTOPIC PREGNANCY SURGERY     LUMBAR LAMINECTOMY/DECOMPRESSION MICRODISCECTOMY Left 05/07/2017   Procedure: Decompression L4-L5 and excision of synovial cyst Left;  Surgeon: Heide Ingle, MD;  Location: [facility];  Service: Orthopedics;  Laterality: Left;   TRIGGER FINGER RELEASE      SOCIAL HISTORY: Social History   Socioeconomic History   Marital status: Widowed    Spouse name: Not on file   Number of children: Not on file   Years of education: Not on file   Highest education level: Not on file  Occupational History   Not on file  Tobacco Use   Smoking status: Former    Current packs/day: 0.00    Types: Cigarettes    Quit date: 12/31/1996    Years since quitting: 27.8   Smokeless tobacco: Never  Vaping Use   Vaping status: Never Used  Substance and Sexual Activity   Alcohol use: Yes    Alcohol/week: 1.0 - 2.0 standard drink of alcohol    Types: 1 - 2 Standard drinks or equivalent per week   Drug use: No   Sexual activity: Yes    Birth control/protection: Surgical, Post-menopausal, Condom    Comment: hysterectomy  Other Topics Concern   Not on file  Social History Narrative   Not on file   Social Drivers of Health   Financial Resource Strain: Not on file  Food Insecurity: Low Risk  (06/19/2024)   Received from [facility]   Hunger Vital Sign    Within the past 12 months, you worried that your food would run out before you got money to buy more: Never true    Within the past 12 months, the food you bought just didn't last and you didn't have money to get more. : Never true  Transportation Needs: No Transportation Needs (06/19/2024)   Received from [facility]    In the past 12 months, has lack of reliable transportation kept you from medical appointments, meetings, work or from getting things needed  for daily living? : No  Physical Activity: Not on file  Stress: Not on file  Social Connections: Not on file  Intimate Partner Violence: Not on file    FAMILY HISTORY: Family History  Problem Relation Age of Onset   Cancer Mother 20       vaginal cancer   Breast cancer Mother 55       second primary at age 36   Hypertension Mother    Lung cancer Father 40       smoking hx   Diabetes Father    Hypertension Father    Breast cancer Sister 12       recurrence 10 yrs later and bilateral mastectomies   Hypertension Sister    Breast cancer Maternal Aunt        dx before 53, x4   Breast cancer Maternal Aunt  dx after 50   Pancreatic cancer Maternal Aunt    Kidney cancer Maternal Grandmother        dx 30s   Breast cancer Cousin        several female maternal cousins with premenopausal breast cancer    ALLERGIES:  has no known allergies.  MEDICATIONS:  Current Outpatient Medications  Medication Sig Dispense Refill   anastrozole  (ARIMIDEX ) 1 MG tablet Take 1 tablet (1 mg total) by mouth daily. 90 tablet 0   cetirizine  (ZYRTEC ) 10 MG tablet Take 1 tablet (10 mg total) by mouth daily. 90 tablet 3   chlorpheniramine-HYDROcodone  (TUSSIONEX) 10-8 MG/5ML Take 5 mLs by mouth every 12 (twelve) hours as needed for cough. 100 mL 0   Cholecalciferol (VITAMIN D ) 50 MCG (2000 UT) CAPS Take 1 capsule (2,000 Units total) by mouth daily. 30 capsule    clobetasol  cream (TEMOVATE ) 0.05 % Once daily three times a week 30 g 1   clobetasol  ointment (TEMOVATE ) 0.05 % Apply to scalp 4 times weekly. NEVER to face/groin 180 g 1   Dentifrices (SENSITIVE TOOTHPASTE/FLUORIDE ) 5-0.243 % PSTE Use to brush teeth 2x daily. 300 g 3   Evolocumab  (REPATHA  SURECLICK) 140 MG/ML SOAJ Inject 140 mg into the skin every 14 (fourteen) days. 6 mL 3   Evolocumab  (REPATHA ) 140 MG/ML SOSY Inject 140 mg into the skin every 14 (fourteen) days. 2 mL 11   finasteride  (PROSCAR ) 5 MG tablet Take 1 tablet (5 mg total) by mouth  daily. 90 tablet 1   finasteride  (PROSCAR ) 5 MG tablet Take 1 tablet (5 mg total) by mouth daily. 90 tablet 2   guaiFENesin -codeine  100-10 MG/5ML syrup Take 5 mL by mouth 3 (three) times a day as needed for cough. 120 mL 0   hydrochlorothiazide  (HYDRODIURIL ) 25 MG tablet Take 1/2 tablet once a day 45 tablet 3   hydrochlorothiazide  (HYDRODIURIL ) 25 MG tablet Take 1 tablet (25 mg total) by mouth daily. 90 tablet 0   LORazepam  (ATIVAN ) 0.5 MG tablet Take 1 tablet (0.5 mg total) by mouth 2 (two) times daily as needed for anxiety. TAKE 1 TABLET(0.5 MG) BY MOUTH TWICE DAILY AS NEEDED FOR ANXIETY 30 tablet 0   methocarbamol  (ROBAXIN ) 500 MG tablet Take 1 tablet (500 mg total) by mouth every 6 (six) hours as needed for muscle spasms. 180 tablet 0   naproxen  (NAPROSYN ) 500 MG tablet Take 1 tablet (500 mg total) by mouth 2 (two) times daily (morning and evening) with a meal. 60 tablet 5   nystatin  powder Apply to affected areas three times a day as needed 60 g 2   olopatadine  (PATANOL) 0.1 % ophthalmic solution Place 1 drop into both eyes 2 (two) times daily. 15 mL 3   phenytoin  (DILANTIN ) 100 MG ER capsule Take 2 capsules (200 mg total) by mouth every evening. 180 capsule 3   phenytoin  (DILANTIN ) 100 MG ER capsule Take 2 capsules (200 mg total) by mouth daily. 180 capsule 3   REPATHA  140 MG/ML SOSY Inject into the skin.     scopolamine  (TRANSDERM-SCOP) 1 MG/3DAYS Place 1 patch (1.5 mg total) onto the skin behind ear every three (3) days as needed for nausea and vomiting while on a cruise. 4 patch 0   scopolamine  (TRANSDERM-SCOP, 1.5 MG,) 1 MG/3DAYS Place 1 patch (1.5 mg total) onto the skin every 3 (three) days. 10 patch 1   Sod Fluoride -Potassium Nitrate (PREVIDENT  5000 SENSITIVE) 1.1-5 % PSTE PreviDent  5000 Sensitive 1.1 %-5 % dental paste 100 mL  5   traZODone  (DESYREL ) 50 MG tablet Take 0.5-1 tablets (25-50 mg total) by mouth at bedtime as needed for sleep. 30 tablet 3   traZODone  (DESYREL ) 50 MG tablet  Take 1 tablet (50 mg total) by mouth at bedtime. 30 tablet 5   valACYclovir  (VALTREX ) 1000 MG tablet (1/2) tablet daily for suppression- may increase to twice daily x3 days at onset of outbreak 45 tablet 3   valACYclovir  (VALTREX ) 1000 MG tablet Take 0.5 tablets (500 mg total) by mouth daily for suppression. May increase to twice daily for 3 days at onset of outbreak. 60 tablet 11   vitamin C  (ASCORBIC ACID) 500 MG tablet Take 2 tablets (1,000 mg total) by mouth daily.     No current facility-administered medications for this visit.   PHYSICAL EXAMINATION: ECOG PERFORMANCE STATUS: 0 - Asymptomatic  Vitals:   10/20/24 1327  BP: 118/71  Pulse: 76  Resp: 16  Temp: 98.3 F (36.8 C)  SpO2: 97%     Filed Weights   10/20/24 1327  Weight: 153 lb (69.4 kg)    Physical Exam Constitutional:      Appearance: Normal appearance.  Cardiovascular:     Rate and Rhythm: Normal rate and regular rhythm.     Pulses: Normal pulses.     Heart sounds: Normal heart sounds.  Chest:     Comments: Bilateral breast examined.  No palpable masses or regional adenopathy Musculoskeletal:     Cervical back: Normal range of motion and neck supple. No rigidity.  Lymphadenopathy:     Cervical: No cervical adenopathy.  Neurological:     General: No focal deficit present.     Mental Status: She is alert.  Psychiatric:        Mood and Affect: Mood normal.      LABORATORY DATA:  I have reviewed the data as listed Lab Results  Component Value Date   WBC 5.1 03/07/2021   HGB 13.5 03/07/2021   HCT 40.1 03/07/2021   MCV 87.0 03/07/2021   PLT 256 03/07/2021     Chemistry      Component Value Date/Time   NA 138 03/07/2021 0959   NA 138 06/03/2012 0000   K 4.2 03/07/2021 0959   CL 103 03/07/2021 0959   CO2 22 03/07/2021 0959   BUN 15 03/07/2021 0959   BUN 13 03/23/2013 0000   CREATININE 0.96 03/07/2021 0959   GLU 89 03/23/2013 0000      Component Value Date/Time   CALCIUM  9.2 03/07/2021 0959    ALKPHOS 95 06/03/2017 0913   AST 19 03/07/2021 0959   ALT 14 03/07/2021 0959   BILITOT 0.3 03/07/2021 0959     I have reviewed pertinent mammogram and MRI results.  Have reviewed pertinent genetic testing results and risk scores based on models calculated.  Lifetime risk of breast cancer per TC model is 21%. 5-year risk of breast cancer per Alisa model is 3.6%.  RADIOGRAPHIC STUDIES: I have personally reviewed the radiological images as listed and agreed with the findings in the report. No results found.    Amber Stalls, MD 10/20/2024 1:52 PM

## 2024-10-24 ENCOUNTER — Other Ambulatory Visit (HOSPITAL_COMMUNITY): Payer: Self-pay

## 2024-10-26 ENCOUNTER — Other Ambulatory Visit: Payer: Self-pay

## 2024-10-30 ENCOUNTER — Other Ambulatory Visit (HOSPITAL_BASED_OUTPATIENT_CLINIC_OR_DEPARTMENT_OTHER): Payer: Self-pay

## 2024-10-30 ENCOUNTER — Other Ambulatory Visit: Payer: Self-pay

## 2024-10-30 ENCOUNTER — Other Ambulatory Visit (HOSPITAL_COMMUNITY): Payer: Self-pay

## 2024-10-30 MED ORDER — CLOBETASOL PROPIONATE 0.05 % EX OINT
TOPICAL_OINTMENT | CUTANEOUS | 1 refills | Status: AC
  Filled 2024-10-30: qty 180, 90d supply, fill #0

## 2024-11-09 ENCOUNTER — Other Ambulatory Visit (HOSPITAL_COMMUNITY): Payer: Self-pay

## 2024-11-09 MED ORDER — CLOBETASOL PROPIONATE 0.05 % EX OINT
TOPICAL_OINTMENT | CUTANEOUS | 1 refills | Status: AC
  Filled 2024-11-09 – 2024-11-23 (×2): qty 180, 30d supply, fill #0
  Filled 2025-01-21: qty 180, 90d supply, fill #0

## 2024-11-09 MED ORDER — MINOXIDIL 2.5 MG PO TABS
ORAL_TABLET | ORAL | 3 refills | Status: AC
  Filled 2024-11-09: qty 45, 90d supply, fill #0
  Filled 2025-01-29: qty 45, 90d supply, fill #1

## 2024-11-09 MED ORDER — FINASTERIDE 5 MG PO TABS
5.0000 mg | ORAL_TABLET | Freq: Every day | ORAL | 2 refills | Status: AC
  Filled 2024-11-09 – 2025-01-21 (×2): qty 90, 90d supply, fill #0

## 2024-11-10 ENCOUNTER — Other Ambulatory Visit: Payer: Self-pay

## 2024-11-16 ENCOUNTER — Other Ambulatory Visit (HOSPITAL_COMMUNITY): Payer: Self-pay

## 2024-11-18 ENCOUNTER — Encounter: Payer: Self-pay | Admitting: Hematology and Oncology

## 2024-11-23 ENCOUNTER — Encounter (HOSPITAL_COMMUNITY): Payer: Self-pay

## 2024-11-23 ENCOUNTER — Other Ambulatory Visit: Payer: Self-pay

## 2024-11-23 ENCOUNTER — Other Ambulatory Visit (HOSPITAL_COMMUNITY): Payer: Self-pay

## 2024-11-24 ENCOUNTER — Other Ambulatory Visit (HOSPITAL_COMMUNITY): Payer: Self-pay

## 2024-11-24 MED ORDER — CLOBETASOL PROPIONATE 0.05 % EX OINT
TOPICAL_OINTMENT | CUTANEOUS | 6 refills | Status: AC
  Filled 2024-11-24: qty 60, 30d supply, fill #0

## 2024-12-05 ENCOUNTER — Ambulatory Visit
Admission: RE | Admit: 2024-12-05 | Discharge: 2024-12-05 | Disposition: A | Source: Ambulatory Visit | Attending: Hematology and Oncology

## 2024-12-05 DIAGNOSIS — Z9189 Other specified personal risk factors, not elsewhere classified: Secondary | ICD-10-CM

## 2024-12-05 MED ORDER — GADOPICLENOL 0.5 MMOL/ML IV SOLN
6.0000 mL | Freq: Once | INTRAVENOUS | Status: AC | PRN
  Administered 2024-12-05: 6 mL via INTRAVENOUS

## 2025-01-01 ENCOUNTER — Other Ambulatory Visit (HOSPITAL_COMMUNITY): Payer: Self-pay

## 2025-01-04 ENCOUNTER — Other Ambulatory Visit (HOSPITAL_COMMUNITY): Payer: Self-pay

## 2025-01-04 ENCOUNTER — Other Ambulatory Visit: Payer: Self-pay

## 2025-01-04 MED ORDER — HYDROCHLOROTHIAZIDE 25 MG PO TABS
25.0000 mg | ORAL_TABLET | Freq: Every day | ORAL | 0 refills | Status: AC
  Filled 2025-01-04: qty 90, 90d supply, fill #0

## 2025-01-07 ENCOUNTER — Other Ambulatory Visit (HOSPITAL_COMMUNITY): Payer: Self-pay

## 2025-01-08 ENCOUNTER — Other Ambulatory Visit (HOSPITAL_COMMUNITY): Payer: Self-pay

## 2025-01-12 ENCOUNTER — Other Ambulatory Visit (HOSPITAL_COMMUNITY): Payer: Self-pay

## 2025-01-22 ENCOUNTER — Other Ambulatory Visit: Payer: Self-pay

## 2025-01-22 ENCOUNTER — Other Ambulatory Visit (HOSPITAL_BASED_OUTPATIENT_CLINIC_OR_DEPARTMENT_OTHER): Payer: Self-pay

## 2025-01-29 ENCOUNTER — Other Ambulatory Visit: Payer: Self-pay

## 2025-10-19 ENCOUNTER — Inpatient Hospital Stay: Admitting: Hematology and Oncology
